# Patient Record
Sex: Male | Born: 1955 | Race: White | Hispanic: No | Marital: Married | Smoking: Never smoker
Health system: Southern US, Community
[De-identification: ages and names within clinical notes are randomized; demographics above are authoritative.]

## PROBLEM LIST (undated history)

## (undated) DIAGNOSIS — K5900 Constipation, unspecified: Secondary | ICD-10-CM

## (undated) DIAGNOSIS — I890 Lymphedema, not elsewhere classified: Secondary | ICD-10-CM

## (undated) DIAGNOSIS — J189 Pneumonia, unspecified organism: Secondary | ICD-10-CM

## (undated) DIAGNOSIS — G9341 Metabolic encephalopathy: Secondary | ICD-10-CM

## (undated) DIAGNOSIS — R569 Unspecified convulsions: Secondary | ICD-10-CM

## (undated) DIAGNOSIS — D509 Iron deficiency anemia, unspecified: Secondary | ICD-10-CM

## (undated) DIAGNOSIS — F79 Unspecified intellectual disabilities: Secondary | ICD-10-CM

## (undated) DIAGNOSIS — E875 Hyperkalemia: Secondary | ICD-10-CM

## (undated) DIAGNOSIS — K5190 Ulcerative colitis, unspecified, without complications: Secondary | ICD-10-CM

## (undated) DIAGNOSIS — N1330 Unspecified hydronephrosis: Secondary | ICD-10-CM

## (undated) DIAGNOSIS — IMO0001 Reserved for inherently not codable concepts without codable children: Secondary | ICD-10-CM

## (undated) DIAGNOSIS — B49 Unspecified mycosis: Secondary | ICD-10-CM

## (undated) DIAGNOSIS — E669 Obesity, unspecified: Secondary | ICD-10-CM

## (undated) DIAGNOSIS — N200 Calculus of kidney: Secondary | ICD-10-CM

## (undated) DIAGNOSIS — M6281 Muscle weakness (generalized): Secondary | ICD-10-CM

## (undated) HISTORY — PX: OTHER SURGICAL HISTORY: SHX169

---

## 2001-11-22 ENCOUNTER — Ambulatory Visit (HOSPITAL_COMMUNITY): Admission: RE | Admit: 2001-11-22 | Discharge: 2001-11-22 | Payer: Self-pay | Admitting: Family Medicine

## 2003-03-03 ENCOUNTER — Encounter: Admission: RE | Admit: 2003-03-03 | Discharge: 2003-05-31 | Payer: Self-pay | Admitting: Specialist

## 2010-03-18 ENCOUNTER — Inpatient Hospital Stay (HOSPITAL_COMMUNITY): Admission: EM | Admit: 2010-03-18 | Discharge: 2010-03-24 | Payer: Self-pay | Admitting: Emergency Medicine

## 2011-02-11 LAB — CBC
HCT: 33.1 % — ABNORMAL LOW (ref 39.0–52.0)
HCT: 39 % (ref 39.0–52.0)
HCT: 48.3 % (ref 39.0–52.0)
HCT: 51.3 % (ref 39.0–52.0)
Hemoglobin: 11.1 g/dL — ABNORMAL LOW (ref 13.0–17.0)
Hemoglobin: 16.1 g/dL (ref 13.0–17.0)
Hemoglobin: 17.1 g/dL — ABNORMAL HIGH (ref 13.0–17.0)
MCHC: 33.9 g/dL (ref 30.0–36.0)
MCHC: 33.3 g/dL (ref 30.0–36.0)
MCHC: 33.4 g/dL (ref 30.0–36.0)
MCHC: 33.4 g/dL (ref 30.0–36.0)
MCV: 93.3 fL (ref 78.0–100.0)
MCV: 94.2 fL (ref 78.0–100.0)
MCV: 94.8 fL (ref 78.0–100.0)
Platelets: 298 10*3/uL (ref 150–400)
Platelets: 191 10*3/uL (ref 150–400)
Platelets: 194 10*3/uL (ref 150–400)
Platelets: 242 K/uL (ref 150–400)
RBC: 5.45 MIL/uL (ref 4.22–5.81)
RDW: 15.3 % (ref 11.5–15.5)
RDW: 15.2 % (ref 11.5–15.5)
RDW: 15.2 % (ref 11.5–15.5)
RDW: 15.3 % (ref 11.5–15.5)
WBC: 8 10*3/uL (ref 4.0–10.5)
WBC: 20.4 10*3/uL — ABNORMAL HIGH (ref 4.0–10.5)
WBC: 24.2 10*3/uL — ABNORMAL HIGH (ref 4.0–10.5)

## 2011-02-11 LAB — URINALYSIS, ROUTINE W REFLEX MICROSCOPIC
Glucose, UA: 250 mg/dL — AB
Nitrite: NEGATIVE
Protein, ur: 30 mg/dL — AB
Specific Gravity, Urine: 1.023 (ref 1.005–1.030)
Urobilinogen, UA: 1 mg/dL (ref 0.0–1.0)
pH: 5 (ref 5.0–8.0)

## 2011-02-11 LAB — URINE CULTURE
Colony Count: NO GROWTH
Culture: NO GROWTH

## 2011-02-11 LAB — DIFFERENTIAL
Basophils Absolute: 0 10*3/uL (ref 0.0–0.1)
Basophils Relative: 0 % (ref 0–1)
Eosinophils Absolute: 0 10*3/uL (ref 0.0–0.7)
Eosinophils Absolute: 0 10*3/uL (ref 0.0–0.7)
Eosinophils Relative: 0 % (ref 0–5)
Eosinophils Relative: 0 % (ref 0–5)
Lymphocytes Relative: 3 % — ABNORMAL LOW (ref 12–46)
Lymphocytes Relative: 4 % — ABNORMAL LOW (ref 12–46)
Lymphs Abs: 0.7 K/uL (ref 0.7–4.0)
Lymphs Abs: 0.8 10*3/uL (ref 0.7–4.0)
Monocytes Absolute: 1.1 K/uL — ABNORMAL HIGH (ref 0.1–1.0)
Monocytes Absolute: 2.9 10*3/uL — ABNORMAL HIGH (ref 0.1–1.0)
Monocytes Relative: 14 % — ABNORMAL HIGH (ref 3–12)
Monocytes Relative: 5 % (ref 3–12)
Neutro Abs: 22.4 K/uL — ABNORMAL HIGH (ref 1.7–7.7)
Neutrophils Relative %: 92 % — ABNORMAL HIGH (ref 43–77)

## 2011-02-11 LAB — COMPREHENSIVE METABOLIC PANEL
AST: 18 U/L (ref 0–37)
Albumin: 2.2 g/dL — ABNORMAL LOW (ref 3.5–5.2)
Alkaline Phosphatase: 78 U/L (ref 39–117)
BUN: 45 mg/dL — ABNORMAL HIGH (ref 6–23)
BUN: 54 mg/dL — ABNORMAL HIGH (ref 6–23)
CO2: 20 mEq/L (ref 19–32)
CO2: 22 mEq/L (ref 19–32)
Chloride: 108 mEq/L (ref 96–112)
Chloride: 109 mEq/L (ref 96–112)
Creatinine, Ser: 1.18 mg/dL (ref 0.4–1.5)
Creatinine, Ser: 1.44 mg/dL (ref 0.4–1.5)
GFR calc non Af Amer: 52 mL/min — ABNORMAL LOW (ref >60)
GFR calc non Af Amer: >60 mL/min (ref >60)
Glucose, Bld: 153 mg/dL — ABNORMAL HIGH (ref 70–99)
Potassium: 6.6 mEq/L (ref 3.5–5.1)
Total Bilirubin: 0.4 mg/dL (ref 0.3–1.2)
Total Bilirubin: 0.5 mg/dL (ref 0.3–1.2)

## 2011-02-11 LAB — CULTURE, BLOOD (ROUTINE X 2)
Culture: NO GROWTH
Culture: NO GROWTH

## 2011-02-11 LAB — CLOSTRIDIUM DIFFICILE EIA: C difficile Toxins A+B, EIA: NEGATIVE

## 2011-02-11 LAB — BASIC METABOLIC PANEL
BUN: 2 mg/dL — ABNORMAL LOW (ref 6–23)
BUN: 3 mg/dL — ABNORMAL LOW (ref 6–23)
BUN: 1 mg/dL — ABNORMAL LOW (ref 6–23)
BUN: 29 mg/dL — ABNORMAL HIGH (ref 6–23)
CO2: 14 mEq/L — ABNORMAL LOW (ref 19–32)
CO2: 13 mEq/L — ABNORMAL LOW (ref 19–32)
CO2: 16 mEq/L — ABNORMAL LOW (ref 19–32)
CO2: 19 mEq/L (ref 19–32)
Calcium: 7.7 mg/dL — ABNORMAL LOW (ref 8.4–10.5)
Calcium: 7.4 mg/dL — ABNORMAL LOW (ref 8.4–10.5)
Calcium: 7.5 mg/dL — ABNORMAL LOW (ref 8.4–10.5)
Calcium: 7.6 mg/dL — ABNORMAL LOW (ref 8.4–10.5)
Chloride: 116 mEq/L — ABNORMAL HIGH (ref 96–112)
Chloride: 114 mEq/L — ABNORMAL HIGH (ref 96–112)
Creatinine, Ser: 0.72 mg/dL (ref 0.4–1.5)
Creatinine, Ser: 0.74 mg/dL (ref 0.4–1.5)
Creatinine, Ser: 0.78 mg/dL (ref 0.4–1.5)
GFR calc Af Amer: >60 mL/min (ref >60)
GFR calc Af Amer: >60 mL/min (ref >60)
GFR calc Af Amer: >60 mL/min (ref >60)
GFR calc non Af Amer: >60 mL/min (ref >60)
GFR calc non Af Amer: >60 mL/min (ref >60)
GFR calc non Af Amer: >60 mL/min (ref >60)
Glucose, Bld: 100 mg/dL — ABNORMAL HIGH (ref 70–99)
Glucose, Bld: 101 mg/dL — ABNORMAL HIGH (ref 70–99)
Glucose, Bld: 113 mg/dL — ABNORMAL HIGH (ref 70–99)
Glucose, Bld: 81 mg/dL (ref 70–99)
Glucose, Bld: 89 mg/dL (ref 70–99)
Potassium: 3.2 mEq/L — ABNORMAL LOW (ref 3.5–5.1)
Potassium: 4.3 mEq/L (ref 3.5–5.1)
Sodium: 143 mEq/L (ref 135–145)
Sodium: 139 mEq/L (ref 135–145)
Sodium: 142 mEq/L (ref 135–145)

## 2011-02-11 LAB — STOOL CULTURE

## 2011-02-11 LAB — URINE MICROSCOPIC-ADD ON

## 2011-02-11 LAB — POCT I-STAT, CHEM 8
Creatinine, Ser: 1.1 mg/dL (ref 0.4–1.5)
Glucose, Bld: 202 mg/dL — ABNORMAL HIGH (ref 70–99)
HCT: 54 % — ABNORMAL HIGH (ref 39.0–52.0)
Hemoglobin: 18.4 g/dL — ABNORMAL HIGH (ref 13.0–17.0)
Potassium: 5.9 mEq/L — ABNORMAL HIGH (ref 3.5–5.1)
Sodium: 138 mEq/L (ref 135–145)
TCO2: 22 mmol/L (ref 0–100)

## 2011-02-11 LAB — BLOOD GAS, VENOUS
Acid-base deficit: 7 mmol/L — ABNORMAL HIGH (ref 0.0–2.0)
Bicarbonate: 20.2 mEq/L (ref 20.0–24.0)
O2 Saturation: 52.1 %
Patient temperature: 98.6
TCO2: 17.8 mmol/L (ref 0–100)
pH, Ven: 7.257 (ref 7.250–7.300)

## 2011-02-11 LAB — CARDIAC PANEL(CRET KIN+CKTOT+MB+TROPI)
CK, MB: 0.9 ng/mL (ref 0.3–4.0)
Relative Index: INVALID (ref 0.0–2.5)
Total CK: 12 U/L (ref 7–232)
Total CK: 9 U/L (ref 7–232)
Troponin I: 0.01 ng/mL (ref 0.00–0.06)

## 2011-02-11 LAB — LACTIC ACID, PLASMA: Lactic Acid, Venous: 3 mmol/L — ABNORMAL HIGH (ref 0.5–2.2)

## 2011-02-11 LAB — PROTIME-INR
INR: 1.12 (ref 0.00–1.49)
Prothrombin Time: 14.3 s (ref 11.6–15.2)

## 2011-02-11 LAB — HEMOCCULT GUIAC POC 1CARD (OFFICE): Fecal Occult Bld: POSITIVE

## 2011-02-11 LAB — OVA AND PARASITE EXAMINATION

## 2011-02-11 LAB — APTT: aPTT: 30 seconds (ref 24–37)

## 2012-01-11 ENCOUNTER — Encounter (HOSPITAL_COMMUNITY): Payer: Self-pay | Admitting: Emergency Medicine

## 2012-01-11 ENCOUNTER — Inpatient Hospital Stay (HOSPITAL_COMMUNITY)
Admission: EM | Admit: 2012-01-11 | Discharge: 2012-01-20 | DRG: 853 | Disposition: A | Discharge status: Skilled Nursing Facility | Payer: Medicare Other | Attending: Internal Medicine | Admitting: Internal Medicine

## 2012-01-11 ENCOUNTER — Emergency Department (HOSPITAL_COMMUNITY): Payer: Medicare Other

## 2012-01-11 DIAGNOSIS — Z66 Do not resuscitate: Secondary | ICD-10-CM | POA: Diagnosis present

## 2012-01-11 DIAGNOSIS — R739 Hyperglycemia, unspecified: Secondary | ICD-10-CM | POA: Diagnosis present

## 2012-01-11 DIAGNOSIS — R0902 Hypoxemia: Secondary | ICD-10-CM | POA: Diagnosis present

## 2012-01-11 DIAGNOSIS — F72 Severe intellectual disabilities: Secondary | ICD-10-CM | POA: Diagnosis present

## 2012-01-11 DIAGNOSIS — E870 Hyperosmolality and hypernatremia: Secondary | ICD-10-CM | POA: Diagnosis present

## 2012-01-11 DIAGNOSIS — F79 Unspecified intellectual disabilities: Secondary | ICD-10-CM | POA: Diagnosis present

## 2012-01-11 DIAGNOSIS — K567 Ileus, unspecified: Secondary | ICD-10-CM | POA: Diagnosis present

## 2012-01-11 DIAGNOSIS — E876 Hypokalemia: Secondary | ICD-10-CM | POA: Diagnosis not present

## 2012-01-11 DIAGNOSIS — G40909 Epilepsy, unspecified, not intractable, without status epilepticus: Secondary | ICD-10-CM | POA: Diagnosis present

## 2012-01-11 DIAGNOSIS — R7309 Other abnormal glucose: Secondary | ICD-10-CM | POA: Diagnosis present

## 2012-01-11 DIAGNOSIS — N179 Acute kidney failure, unspecified: Secondary | ICD-10-CM | POA: Diagnosis present

## 2012-01-11 DIAGNOSIS — N200 Calculus of kidney: Secondary | ICD-10-CM | POA: Diagnosis present

## 2012-01-11 DIAGNOSIS — D72829 Elevated white blood cell count, unspecified: Secondary | ICD-10-CM | POA: Diagnosis present

## 2012-01-11 DIAGNOSIS — A0472 Enterocolitis due to Clostridium difficile, not specified as recurrent: Secondary | ICD-10-CM | POA: Diagnosis present

## 2012-01-11 DIAGNOSIS — J189 Pneumonia, unspecified organism: Secondary | ICD-10-CM

## 2012-01-11 DIAGNOSIS — A419 Sepsis, unspecified organism: Principal | ICD-10-CM | POA: Diagnosis present

## 2012-01-11 DIAGNOSIS — N1330 Unspecified hydronephrosis: Secondary | ICD-10-CM | POA: Diagnosis present

## 2012-01-11 HISTORY — DX: Unspecified intellectual disabilities: F79

## 2012-01-11 HISTORY — DX: Pneumonia, unspecified organism: J18.9

## 2012-01-11 HISTORY — DX: Reserved for inherently not codable concepts without codable children: IMO0001

## 2012-01-11 HISTORY — DX: Hyperkalemia: E87.5

## 2012-01-11 HISTORY — DX: Muscle weakness (generalized): M62.81

## 2012-01-11 HISTORY — DX: Unspecified convulsions: R56.9

## 2012-01-11 HISTORY — DX: Calculus of kidney: N20.0

## 2012-01-11 HISTORY — DX: Lymphedema, not elsewhere classified: I89.0

## 2012-01-11 HISTORY — DX: Unspecified hydronephrosis: N13.30

## 2012-01-11 HISTORY — DX: Metabolic encephalopathy: G93.41

## 2012-01-11 HISTORY — DX: Ulcerative colitis, unspecified, without complications: K51.90

## 2012-01-11 HISTORY — DX: Constipation, unspecified: K59.00

## 2012-01-11 HISTORY — DX: Obesity, unspecified: E66.9

## 2012-01-11 LAB — COMPREHENSIVE METABOLIC PANEL
Albumin: 2.6 g/dL — ABNORMAL LOW (ref 3.5–5.2)
Alkaline Phosphatase: 154 U/L — ABNORMAL HIGH (ref 39–117)
BUN: 46 mg/dL — ABNORMAL HIGH (ref 6–23)
Calcium: 9.1 mg/dL (ref 8.4–10.5)
Potassium: 3.7 mEq/L (ref 3.5–5.1)
Total Protein: 7.6 g/dL (ref 6.0–8.3)

## 2012-01-11 MED ORDER — VANCOMYCIN HCL IN DEXTROSE 1-5 GM/200ML-% IV SOLN
1000.0000 mg | Freq: Once | INTRAVENOUS | Status: AC
  Administered 2012-01-12: 1000 mg via INTRAVENOUS
  Filled 2012-01-11: qty 200

## 2012-01-11 MED ORDER — SODIUM CHLORIDE 0.9 % IV BOLUS (SEPSIS)
250.0000 mL | Freq: Once | INTRAVENOUS | Status: AC
  Administered 2012-01-11: 250 mL via INTRAVENOUS

## 2012-01-11 MED ORDER — PIPERACILLIN-TAZOBACTAM 3.375 G IVPB
3.3750 g | Freq: Once | INTRAVENOUS | Status: AC
  Administered 2012-01-12: 3.375 g via INTRAVENOUS
  Filled 2012-01-11: qty 50

## 2012-01-11 NOTE — ED Notes (Signed)
AS per EMS, pt has had a fever for the last 24 hours. Pt is non verbal. Pt is a resident at [facility]

## 2012-01-11 NOTE — ED Provider Notes (Signed)
History     CSN: [id number]  Arrival date & time 01/11/12  2028   First MD Initiated Contact with Patient 01/11/12 2049      Chief Complaint  Patient presents with  . Fever    (Consider location/radiation/quality/duration/timing/severity/associated sxs/prior treatment) HPI Comments: This is a 56 year old gentleman, who is in clips.  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  That does resolve with antipyretics today.  Chest x-ray and acute abdomen.  Study was done along with white count, which reveals that he has an elevated white count to 29,300 and infiltrate in the right lower lung base.  There is also a documented, ileus or possibly large bowel obstruction with multiple air-fluid levels.  An acute abdominal series.  He was sent to the emergency room for further evaluation.  Father states that last year had a similar presentation of ileus that resolved after one week of hospitalization, requiring no surgery.  Father does breast grade concerned that his son would not tolerate surgery  Patient is a 56 y.o. male presenting with fever. The history is provided by a parent. The history is limited by a language barrier.  Fever Primary symptoms of the febrile illness include fever and abdominal pain. Primary symptoms do not include cough or shortness of breath. The current episode started yesterday. This is a new problem. The problem has not changed since onset.   Past Medical History  Diagnosis Date  . Ulcerative colitis, unspecified   . Metabolic encephalopathy   . Unspecified intellectual disabilities   . Obesity, unspecified   . Hyperpotassemia   . Other convulsions   . Other lymphedema   . Unspecified constipation   . Other physical therapy   . Muscle weakness (generalized)     No past surgical history on file.  No family history on file.  History  Substance Use Topics  . Smoking status: Not on file  . Smokeless tobacco: Not on file    . Alcohol Use: No      Review of Systems  Constitutional: Positive for fever.  Respiratory: Negative for cough and shortness of breath.   Gastrointestinal: Positive for abdominal pain and abdominal distention.  Skin: Positive for pallor.  Neurological: Positive for seizures.    Allergies  Review of patient's allergies indicates no known allergies.  Home Medications   Current Outpatient Rx  Name Route Sig Dispense Refill  . ALBUTEROL SULFATE (2.5 MG/3ML) 0.083% IN NEBU Nebulization Take 2.5 mg by nebulization every 6 (six) hours as needed.    Marland Kitchen DEXTROMETHORPHAN POLISTIREX ER 30 MG/5ML PO LQCR Oral Take 60 mg by mouth as needed.    . GUAIFENESIN ER 600 MG PO TB12 Oral Take 1,200 mg by mouth 2 (two) times daily.    Marland Kitchen LACTULOSE 10 GM/15ML PO SOLN Oral Take 30 g by mouth daily.    Marland Kitchen OVER THE COUNTER MEDICATION Oral Take 1 tablet by mouth See admin instructions. Swiss Kriss laxative 1 tablet with every meal.    . PHENOBARBITAL 100 MG PO TABS Oral Take 100 mg by mouth daily. Takes along with a 60mg  tablet for a total of 160mg     . PHENOBARBITAL 60 MG PO TABS Oral Take 60 mg by mouth daily. Takes along with a 100mg  tablet for a total dosing of 160mg     . PHENYTOIN 125 MG/5ML PO SUSP Oral Take 200 mg by mouth 2 (two) times daily.    Marland Kitchen POTASSIUM CHLORIDE ER 10 MEQ  PO TBCR Oral Take 20 mEq by mouth daily.    Marland Kitchen SIMETHICONE 40 MG/0.6ML PO SUSP Oral Take 120 mg by mouth every 12 (twelve) hours.      BP 109/71  Pulse 130  Temp(Src) 98.1 F (36.7 C) (Oral)  Resp 31  SpO2 90%  Physical Exam  HENT:  Head: Normocephalic.  Eyes: Pupils are equal, round, and reactive to light.  Neck: Normal range of motion.  Cardiovascular: Tachycardia present.   Pulmonary/Chest: Breath sounds normal. No respiratory distress.  Abdominal: He exhibits distension. There is tenderness.  Musculoskeletal: He exhibits no tenderness.  Neurological: He is alert.  Skin: Skin is warm. No rash noted. He is not  diaphoretic. There is pallor.    ED Course  Procedures (including critical care time)   Labs Reviewed  COMPREHENSIVE METABOLIC PANEL   No results found.   No diagnosis found.    MDM   a repeat chest x-ray obtained.  CT of abdomen with IV contrast only, CMET.  Reviewed.  CBC, and urine from outside lab        Arman Filter, NP 01/14/12 1538

## 2012-01-11 NOTE — ED Provider Notes (Signed)
Medical screening examination/treatment/procedure(s) were conducted as a shared visit with non-physician practitioner(s) and myself.  I personally evaluated the patient during the encounter 56 year old, male, with mental retardation, who does not speak and lives in collapse.  Nursing home, presents to the emergency department for evaluation of a fever.  He was seen by his primary care doctor, who had ordered laboratory testing, and x-ray.  He has a leukocytosis of greater than 28,000.  His chest x-ray showed possibility of pneumonia and an abdominal x-ray showed possible bowel obstruction.  We will perform laboratory testing here and repeat x-rays, so, we can see.  The images and then consult the appropriate physician, for further evaluation and care  Nicholes Stairs, MD 01/11/12 2317

## 2012-01-11 NOTE — ED Notes (Signed)
IV team and lab at bedside to assist with IV placement and labs

## 2012-01-11 NOTE — ED Notes (Signed)
Patient transported to X-ray 

## 2012-01-12 ENCOUNTER — Encounter (HOSPITAL_COMMUNITY): Payer: Self-pay | Admitting: Family Medicine

## 2012-01-12 DIAGNOSIS — A0472 Enterocolitis due to Clostridium difficile, not specified as recurrent: Secondary | ICD-10-CM | POA: Diagnosis present

## 2012-01-12 DIAGNOSIS — N200 Calculus of kidney: Secondary | ICD-10-CM

## 2012-01-12 DIAGNOSIS — N179 Acute kidney failure, unspecified: Secondary | ICD-10-CM | POA: Diagnosis present

## 2012-01-12 DIAGNOSIS — N1330 Unspecified hydronephrosis: Secondary | ICD-10-CM

## 2012-01-12 DIAGNOSIS — F79 Unspecified intellectual disabilities: Secondary | ICD-10-CM | POA: Diagnosis present

## 2012-01-12 DIAGNOSIS — D72829 Elevated white blood cell count, unspecified: Secondary | ICD-10-CM | POA: Diagnosis present

## 2012-01-12 DIAGNOSIS — R739 Hyperglycemia, unspecified: Secondary | ICD-10-CM | POA: Diagnosis present

## 2012-01-12 DIAGNOSIS — J189 Pneumonia, unspecified organism: Secondary | ICD-10-CM | POA: Diagnosis present

## 2012-01-12 DIAGNOSIS — K567 Ileus, unspecified: Secondary | ICD-10-CM | POA: Diagnosis present

## 2012-01-12 DIAGNOSIS — G40909 Epilepsy, unspecified, not intractable, without status epilepticus: Secondary | ICD-10-CM | POA: Diagnosis present

## 2012-01-12 HISTORY — DX: Pneumonia, unspecified organism: J18.9

## 2012-01-12 HISTORY — DX: Calculus of kidney: N20.0

## 2012-01-12 HISTORY — DX: Unspecified hydronephrosis: N13.30

## 2012-01-12 LAB — BASIC METABOLIC PANEL
BUN: 46 mg/dL — ABNORMAL HIGH (ref 6–23)
Chloride: 104 mEq/L (ref 96–112)
GFR calc Af Amer: 53 mL/min — ABNORMAL LOW (ref >90)
GFR calc non Af Amer: 46 mL/min — ABNORMAL LOW (ref >90)
Glucose, Bld: 225 mg/dL — ABNORMAL HIGH (ref 70–99)
Potassium: 3.7 mEq/L (ref 3.5–5.1)
Sodium: 141 mEq/L (ref 135–145)

## 2012-01-12 LAB — URINE MICROSCOPIC-ADD ON

## 2012-01-12 LAB — CBC
HCT: 42.5 % (ref 39.0–52.0)
Hemoglobin: 13.8 g/dL (ref 13.0–17.0)
MCH: 30.9 pg (ref 26.0–34.0)
MCHC: 32.5 g/dL (ref 30.0–36.0)
RBC: 4.47 MIL/uL (ref 4.22–5.81)

## 2012-01-12 LAB — URINALYSIS, ROUTINE W REFLEX MICROSCOPIC
Bilirubin Urine: NEGATIVE
Ketones, ur: NEGATIVE mg/dL
Nitrite: NEGATIVE
Urobilinogen, UA: 0.2 mg/dL (ref 0.0–1.0)

## 2012-01-12 LAB — PHENYTOIN LEVEL, TOTAL: Phenytoin Lvl: 16.9 ug/mL (ref 10.0–20.0)

## 2012-01-12 LAB — PHENOBARBITAL LEVEL: Phenobarbital: 25.7 ug/mL (ref 15.0–40.0)

## 2012-01-12 MED ORDER — MUPIROCIN 2 % EX OINT
1.0000 "application " | TOPICAL_OINTMENT | Freq: Two times a day (BID) | CUTANEOUS | Status: AC
  Administered 2012-01-12 – 2012-01-16 (×10): 1 via NASAL
  Filled 2012-01-12: qty 22

## 2012-01-12 MED ORDER — SODIUM CHLORIDE 0.9 % IV SOLN
INTRAVENOUS | Status: DC
  Administered 2012-01-12 – 2012-01-13 (×4): via INTRAVENOUS

## 2012-01-12 MED ORDER — PHENOBARBITAL 20 MG/5ML PO ELIX
160.0000 mg | ORAL_SOLUTION | Freq: Every day | ORAL | Status: DC
  Administered 2012-01-14 – 2012-01-19 (×6): 160 mg via ORAL
  Filled 2012-01-12 (×3): qty 40
  Filled 2012-01-12: qty 35
  Filled 2012-01-12: qty 40
  Filled 2012-01-12: qty 5
  Filled 2012-01-12: qty 45

## 2012-01-12 MED ORDER — LEVOFLOXACIN IN D5W 750 MG/150ML IV SOLN
750.0000 mg | Freq: Once | INTRAVENOUS | Status: AC
  Administered 2012-01-12: 750 mg via INTRAVENOUS
  Filled 2012-01-12: qty 150

## 2012-01-12 MED ORDER — IPRATROPIUM BROMIDE 0.02 % IN SOLN: 0.5000 mg | RESPIRATORY_TRACT | Status: DC | PRN

## 2012-01-12 MED ORDER — SODIUM CHLORIDE 0.9 % IV SOLN
750.0000 mg | Freq: Two times a day (BID) | INTRAVENOUS | Status: DC
  Administered 2012-01-12 – 2012-01-14 (×5): 750 mg via INTRAVENOUS
  Filled 2012-01-12 (×6): qty 750

## 2012-01-12 MED ORDER — LEVOFLOXACIN IN D5W 750 MG/150ML IV SOLN: 750.0000 mg | INTRAVENOUS | Status: DC

## 2012-01-12 MED ORDER — PHENOBARBITAL 97.2 MG PO TABS
97.2000 mg | ORAL_TABLET | Freq: Every day | ORAL | Status: DC
  Administered 2012-01-12: 97.2 mg via ORAL
  Filled 2012-01-12: qty 1

## 2012-01-12 MED ORDER — ENOXAPARIN SODIUM 30 MG/0.3ML ~~LOC~~ SOLN
30.0000 mg | SUBCUTANEOUS | Status: DC
  Administered 2012-01-12: 30 mg via SUBCUTANEOUS
  Filled 2012-01-12 (×2): qty 0.3

## 2012-01-12 MED ORDER — CHLORHEXIDINE GLUCONATE CLOTH 2 % EX PADS
6.0000 | MEDICATED_PAD | Freq: Every day | CUTANEOUS | Status: AC
  Administered 2012-01-12 – 2012-01-16 (×5): 6 via TOPICAL

## 2012-01-12 MED ORDER — CLONIDINE HCL 0.1 MG/24HR TD PTWK
0.1000 mg | MEDICATED_PATCH | TRANSDERMAL | Status: DC
  Administered 2012-01-12 – 2012-01-19 (×2): 0.1 mg via TRANSDERMAL
  Filled 2012-01-12 (×2): qty 1

## 2012-01-12 MED ORDER — PIPERACILLIN-TAZOBACTAM 3.375 G IVPB
3.3750 g | Freq: Three times a day (TID) | INTRAVENOUS | Status: DC
  Administered 2012-01-12 – 2012-01-18 (×19): 3.375 g via INTRAVENOUS
  Filled 2012-01-12 (×22): qty 50

## 2012-01-12 MED ORDER — PHENYTOIN 125 MG/5ML PO SUSP
200.0000 mg | Freq: Two times a day (BID) | ORAL | Status: DC
  Administered 2012-01-12 – 2012-01-20 (×17): 200 mg via ORAL
  Filled 2012-01-12 (×19): qty 8

## 2012-01-12 MED ORDER — PHENOBARBITAL 64.8 MG PO TABS: 64.8000 mg | ORAL_TABLET | Freq: Every day | ORAL | Status: DC

## 2012-01-12 MED ORDER — IOHEXOL 300 MG/ML  SOLN
80.0000 mL | Freq: Once | INTRAMUSCULAR | Status: AC | PRN
  Administered 2012-01-12: 80 mL via INTRAVENOUS

## 2012-01-12 MED ORDER — LACTULOSE 10 GM/15ML PO SOLN
30.0000 g | Freq: Every day | ORAL | Status: DC
  Administered 2012-01-14 – 2012-01-20 (×4): 30 g via ORAL
  Filled 2012-01-12 (×9): qty 45

## 2012-01-12 MED ORDER — PHENOBARBITAL 20 MG/5ML PO ELIX
60.0000 mg | ORAL_SOLUTION | Freq: Once | ORAL | Status: AC
  Administered 2012-01-12: 60 mg via ORAL
  Filled 2012-01-12: qty 15

## 2012-01-12 MED ORDER — ALBUTEROL SULFATE (5 MG/ML) 0.5% IN NEBU
2.5000 mg | INHALATION_SOLUTION | RESPIRATORY_TRACT | Status: DC | PRN
  Filled 2012-01-12: qty 0.5

## 2012-01-12 NOTE — Consult Note (Signed)
Reason for Consult: Renal stones and sepsis Referring Physician: Dr. Eliott Nine Humphrey is an 56 y.o. male.  HPI: Charles Humphrey is a 56 yo mentally retarded non-verbal white male who was admitted with sepsis.  He was found to have a pneumonia but he also had a CT that showed bilateral renal stones.  There is a 6 mm stone in the right renal pelvis without obstruction.  There is a 1.6cm stone at the left UPJ with an additional smaller stone in the upper pole.  There is inflammation of the collecting system but there is not significant dilation of the system.  His UA has 11-20 WBC's and many bacteria and his WBC count is 27,000.   I have spoken to his father and there is no prior history of stones or UTI's.    Past Medical History  Diagnosis Date  . Ulcerative colitis, unspecified   . Metabolic encephalopathy   . Unspecified intellectual disabilities   . Obesity, unspecified   . Hyperpotassemia   . Other convulsions   . Other lymphedema   . Unspecified constipation   . Other physical therapy   . Muscle weakness (generalized)   . Nephrolithiasis 01/12/2012  . Hydronephrosis 01/12/2012  . Healthcare-associated pneumonia 01/12/2012    No past surgical history on file.  No family history on file.  Social History:  reports that he has never smoked. He does not have any smokeless tobacco history on file. He reports that he does not drink alcohol. His drug history not on file.  He lives in a nursing facility and is non-verbal.    Allergies: No Known Allergies  Medications: I have reviewed the patient's current medications.  Results for orders placed during the hospital encounter of 01/11/12 (from the past 48 hour(s))  COMPREHENSIVE METABOLIC PANEL     Status: Abnormal   Collection Time   01/11/12 11:19 PM      Component Value Range Comment   Sodium 142  135 - 145 (mEq/L)    Potassium 3.7  3.5 - 5.1 (mEq/L)    Chloride 104  96 - 112 (mEq/L)    CO2 25  19 - 32 (mEq/L)    Glucose, Bld  208 (*) 70 - 99 (mg/dL)    BUN 46 (*) 6 - 23 (mg/dL)    Creatinine, Ser 4.54 (*) 0.50 - 1.35 (mg/dL)    Calcium 9.1  8.4 - 10.5 (mg/dL)    Total Protein 7.6  6.0 - 8.3 (g/dL)    Albumin 2.6 (*) 3.5 - 5.2 (g/dL)    AST 11  0 - 37 (U/L)    ALT 10  0 - 53 (U/L)    Alkaline Phosphatase 154 (*) 39 - 117 (U/L)    Total Bilirubin 0.5  0.3 - 1.2 (mg/dL)    GFR calc non Af Amer 42 (*) >90 (mL/min)    GFR calc Af Amer 48 (*) >90 (mL/min)   PHENYTOIN LEVEL, TOTAL     Status: Normal   Collection Time   01/12/12  2:15 AM      Component Value Range Comment   Phenytoin Lvl 16.9  10.0 - 20.0 (ug/mL)   PHENOBARBITAL LEVEL     Status: Normal   Collection Time   01/12/12  2:15 AM      Component Value Range Comment   Phenobarbital 25.7  15.0 - 40.0 (ug/mL)   BASIC METABOLIC PANEL     Status: Abnormal   Collection Time   01/12/12  2:15 AM  Component Value Range Comment   Sodium 141  135 - 145 (mEq/L)    Potassium 3.7  3.5 - 5.1 (mEq/L)    Chloride 104  96 - 112 (mEq/L)    CO2 25  19 - 32 (mEq/L)    Glucose, Bld 225 (*) 70 - 99 (mg/dL)    BUN 46 (*) 6 - 23 (mg/dL)    Creatinine, Ser 6.04 (*) 0.50 - 1.35 (mg/dL)    Calcium 8.7  8.4 - 10.5 (mg/dL)    GFR calc non Af Amer 46 (*) >90 (mL/min)    GFR calc Af Amer 53 (*) >90 (mL/min)   CBC     Status: Abnormal   Collection Time   01/12/12  2:15 AM      Component Value Range Comment   WBC 27.5 (*) 4.0 - 10.5 (K/uL) REPEATED TO VERIFY   RBC 4.47  4.22 - 5.81 (MIL/uL)    Hemoglobin 13.8  13.0 - 17.0 (g/dL)    HCT 54.0  98.1 - 19.1 (%)    MCV 95.1  78.0 - 100.0 (fL)    MCH 30.9  26.0 - 34.0 (pg)    MCHC 32.5  30.0 - 36.0 (g/dL)    RDW 47.8  29.5 - 62.1 (%)    Platelets 190  150 - 400 (K/uL)   MRSA PCR SCREENING     Status: Abnormal   Collection Time   01/12/12  4:03 AM      Component Value Range Comment   MRSA by PCR POSITIVE (*) NEGATIVE    URINALYSIS, ROUTINE W REFLEX MICROSCOPIC     Status: Abnormal   Collection Time   01/12/12  1:34 PM       Component Value Range Comment   Color, Urine YELLOW  YELLOW     APPearance TURBID (*) CLEAR     Specific Gravity, Urine 1.039 (*) 1.005 - 1.030     pH 5.5  5.0 - 8.0     Glucose, UA NEGATIVE  NEGATIVE (mg/dL)    Hgb urine dipstick MODERATE (*) NEGATIVE     Bilirubin Urine NEGATIVE  NEGATIVE     Ketones, ur NEGATIVE  NEGATIVE (mg/dL)    Protein, ur 308 (*) NEGATIVE (mg/dL)    Urobilinogen, UA 0.2  0.0 - 1.0 (mg/dL)    Nitrite NEGATIVE  NEGATIVE     Leukocytes, UA MODERATE (*) NEGATIVE    URINE MICROSCOPIC-ADD ON     Status: Abnormal   Collection Time   01/12/12  1:34 PM      Component Value Range Comment   Squamous Epithelial / LPF FEW (*) RARE     WBC, UA 11-20  <3 (WBC/hpf)    RBC / HPF 3-6  <3 (RBC/hpf)    Bacteria, UA MANY (*) RARE     Casts GRANULAR CAST (*) NEGATIVE     Crystals URIC ACID CRYSTALS (*) NEGATIVE     Urine-Other MUCOUS PRESENT   AMORPHOUS URATES/PHOSPHATES    Ct Abdomen Pelvis W Contrast  01/12/2012  *RADIOLOGY REPORT*  Clinical Data: Fever; ileus or distal obstruction suspected on abdominal radiograph.  Assess for ileus or obstruction.  CT ABDOMEN AND PELVIS WITH CONTRAST  Technique:  Multidetector CT imaging of the abdomen and pelvis was performed following the standard protocol during bolus administration of intravenous contrast.  Contrast: 80mL OMNIPAQUE IOHEXOL 300 MG/ML IV SOLN  Comparison: Abdominal radiograph performed 01/11/2012, and CT of the abdomen and pelvis performed 03/18/2010  Findings: There is partial consolidation of  the right lower lobe; this raises concern for pneumonia, though significant atelectasis could have a similar appearance.  Scattered calcifications at the right lung base likely reflect calcified granulomata.  The liver and spleen are unremarkable in appearance.  The gallbladder is within normal limits.  The pancreas and adrenal glands are unremarkable.  There is a large 1.6 x 1.0 cm stone lodged at the left renal pelvis, with a 0.8 cm  stone noted within a left renal calyx, and scattered smaller left renal stones also seen.  There is suggestion of minimal hydronephrosis, raising concern for some degree of obstruction.  Soft tissue inflammation is noted about the largest stone; mild infection cannot be excluded.  The appearance raises concern for impending development of staghorn calculus, new from the prior study.  A smaller 0.6 cm stone is noted within the right renal pelvis, without significant soft tissue inflammation.  There is also a nonobstructing 6 mm stone at the upper pole of the right kidney. Mild nonspecific perinephric stranding is noted bilaterally.  Delayed imaging demonstrates no evidence of contrast excretion from either kidney; this raises concern for decreased renal function.  No free fluid is identified.  The small bowel is largely decompressed and unremarkable in appearance.  The stomach is within normal limits.  No acute vascular abnormalities are seen.  The appendix is normal in caliber, without evidence for appendicitis.  The ascending and descending colon remain normal in caliber, and there is only mild distension of the transverse colon; there is slightly more distension of the sigmoid colon; this appears to reflect chronic underlying sigmoid inflammation given prior evidence of colitis in 2011.  There is corresponding progressive colonic wall thickening along the distal sigmoid colon and rectum, likely reflecting the prior inflammatory process.  No distal obstruction is identified. Findings are not particularly suggestive of ileus.  A 1.3 cm soft tissue cyst along the mid back pain most likely benign in nature, given its appearance.  The bladder is mildly distended and grossly unremarkable in appearance.  The prostate is normal in size, with scattered calcification.  No inguinal lymphadenopathy is seen.  No acute osseous abnormalities are identified.  IMPRESSION:  1.  No evidence of distal obstruction; colonic distension  involves predominantly the sigmoid colon, and is thought to reflect underlying chronic sequelae of prior colitis.  No definite evidence of ileus.  Mild colonic wall thickening along the distal sigmoid colon and rectum is significantly improved from 2011, likely reflects chronic underlying inflammation. 2.  Large 1.6 x 1.0 cm stone lodged at the left renal pelvis, with additional smaller left renal stones.  Soft tissue inflammation about this largest stone, with suggestion of minimal hydronephrosis, raising concern for some degree of obstruction. Infection cannot be excluded, given soft tissue inflammation; the appearance raises concern for impending development of staghorn calculus, new from the prior study. 3.  Smaller 0.6 cm stone at the right renal pelvis; additional nonobstructing right renal stones seen. 4.  No evidence of contrast excretion from either kidney on delayed images, raising concern for bilaterally decreased renal function. 5.  Partial consolidation of the right lower lung lobe, concerning for pneumonia, though significant atelectasis could have a similar appearance.  Original Report Authenticated By: Tonia Ghent, M.D.   Dg Abd Acute W/chest  01/11/2012  *RADIOLOGY REPORT*  Clinical Data: Fever.  Abdominal distention.  ACUTE ABDOMEN SERIES (ABDOMEN 2 VIEW & CHEST 1 VIEW)  Comparison: Chest dated 03/23/2010 and abdomen and pelvis CT dated 03/18/2010.  Findings: Interval dense  opacity in the right mid and upper lung zones.  Clear left lung.  Grossly stable enlarged cardiac silhouette.  Prominent gas-filled redundant colon throughout the abdomen.  Small amount of fluid in the bowel loops in the decubitus position.  No free peritoneal air.  Diffuse osteopenia.  IMPRESSION:  1.  Dense right mid and upper lung zone pneumonia.  Follow-up chest radiographs are recommended until clearing to exclude an underlying mass. 2.  Stable cardiomegaly. 3.  Diffuse colonic ileus or distal obstruction.  Original  Report Authenticated By: Darrol Angel, M.D.    Review of Systems  Unable to perform ROS: patient nonverbal   Blood pressure 125/81, pulse 130, temperature 99 F (37.2 C), temperature source Axillary, resp. rate 22, SpO2 90.00%. Physical Exam  Constitutional:       He has severe MR and is non-verbal.  He has contracted lower extremities.  Cardiovascular: Regular rhythm.  Tachycardia present.   Respiratory: Not tachypneic and not bradypneic. No respiratory distress.  GI: Soft. Normal appearance. There is no tenderness.    Assessment/Plan: He has sepsis with bilateral renal stones with a large left UPJ stone with inflammation and probable obstruction despite the lack of hydro.   He also has an apparent pneumonia.  With the 1.6cm stone in the left kidney along with a second stone, he will be best served by percutaneous nephrostomy tube placement in the near future followed by a left percutaneous nephrolithotomy on an elective basis.    He will require close f/u and probable treatment of the right renal stones as well, but that might be done with ureteroscopy at a later date.  I have spoken with Dr. Darnelle Catalan and Dr. Gwenith Daily about this patient.  Goldia Ligman J 01/12/2012, 4:53 PM

## 2012-01-12 NOTE — Progress Notes (Signed)
PATIENT DETAILS Name: Charles Humphrey Age: 56 y.o. Sex: male Date of Birth: 1956/02/07 Admit Date: 01/11/2012 PCP:No primary provider on file. Emergency contact: Charles Humphrey (father) [phone] (h), [phone] (c)   Medical Consults:  Urology  Other Consults:  Pharmacy for Antibiotic dosing  Brief Narrative: Mr. Charles Humphrey. is a 56 year-old gentleman resident of [facility] with a PMH of mental retardation.  At the nursing home he had a fever up to 101.9 and was hypoxic 89% on RA. Workup done there revealed a pneumonia and a white blood count of 29. He was sent to the ER and was admitted for treatment of HAP.  He was also found to have significant nephrolithiasis with an obstructing stone left renal pelvis and hydronephrosis.  Interval History: Stable over night.  ROS: The patient is non-verbal.  His father reports that he looks better and is not as restless.  Bowels moved last night and again this morning.   Objective: Vital signs in last 24 hours: Temp:  [98.1 F (36.7 C)-99 F (37.2 C)] 99 F (37.2 C) (02/18 0349) Pulse Rate:  [126-131] 126  (02/18 0349) Resp:  [22-32] 22  (02/18 0349) BP: (109-175)/(70-81) 175/81 mmHg (02/18 0349) SpO2:  [90 %-94 %] 91 % (02/18 0349) Weight change:  Last BM Date: 01/12/12  Intake/Output from previous day:  Intake/Output Summary (Last 24 hours) at 01/12/12 1056 Last data filed at 01/12/12 0700  Gross per 24 hour  Intake  640.8 ml  Output    400 ml  Net  240.8 ml     Physical Exam:  Gen:  NAD Cardiovascular:  Tachycardic, No M/R/G Respiratory: Lungs CTAB Gastrointestinal: Abdomen soft, NT/ND with normal active bowel sounds. Extremities: No C/E/C, LE contractures   Lab Results: Basic Metabolic Panel:  Lab 01/12/12 0865 01/11/12 2319  NA 141 142  K 3.7 3.7  CL 104 104  CO2 25 25  GLUCOSE 225* 208*  BUN 46* 46*  CREATININE 1.66* 1.79*  CALCIUM 8.7 9.1  MG -- --  PHOS -- --   GFR CrCl  is unknown because there is no height on file for the current visit. Liver Function Tests:  Lab 01/11/12 2319  AST 11  ALT 10  ALKPHOS 154*  BILITOT 0.5  PROT 7.6  ALBUMIN 2.6*    CBC:  Lab 01/12/12 0215  WBC 27.5*  NEUTROABS --  HGB 13.8  HCT 42.5  MCV 95.1  PLT 190   Microbiology Recent Results (from the past 240 hour(s))  MRSA PCR SCREENING     Status: Abnormal   Collection Time   01/12/12  4:03 AM      Component Value Range Status Comment   MRSA by PCR POSITIVE (*) NEGATIVE  Final       Studies/Results:  Ct Abdomen Pelvis W Contrast 01/12/2012   IMPRESSION:  1.  No evidence of distal obstruction; colonic distension involves predominantly the sigmoid colon, and is thought to reflect underlying chronic sequelae of prior colitis.  No definite evidence of ileus.  Mild colonic wall thickening along the distal sigmoid colon and rectum is significantly improved from 2011, likely reflects chronic underlying inflammation. 2.  Large 1.6 x 1.0 cm stone lodged at the left renal pelvis, with additional smaller left renal stones.  Soft tissue inflammation about this largest stone, with suggestion of minimal hydronephrosis, raising concern for some degree of obstruction. Infection cannot be excluded, given soft tissue inflammation; the appearance raises concern for impending development of staghorn calculus,  new from the prior study. 3.  Smaller 0.6 cm stone at the right renal pelvis; additional nonobstructing right renal stones seen. 4.  No evidence of contrast excretion from either kidney on delayed images, raising concern for bilaterally decreased renal function. 5.  Partial consolidation of the right lower lung lobe, concerning for pneumonia, though significant atelectasis could have a similar appearance.  Original Report Authenticated By: Tonia Ghent, M.D.    Dg Abd Acute W/chest 01/11/2012 IMPRESSION:  1.  Dense right mid and upper lung zone pneumonia.  Follow-up chest radiographs are  recommended until clearing to exclude an underlying mass. 2.  Stable cardiomegaly. 3.  Diffuse colonic ileus or distal obstruction.  Original Report Authenticated By: Darrol Angel, M.D.    Medications: Scheduled Meds:    . Chlorhexidine Gluconate Cloth  6 each Topical Q0600  . cloNIDine  0.1 mg Transdermal Weekly  . enoxaparin  30 mg Subcutaneous Q24H  . lactulose  30 g Oral Daily  . levofloxacin (LEVAQUIN) IV  750 mg Intravenous Once  . mupirocin ointment  1 application Nasal BID  . PHENobarbital  64.8 mg Oral Daily  . PHENobarbital  97.2 mg Oral Daily  . phenytoin  200 mg Oral BID  . piperacillin-tazobactam (ZOSYN)  IV  3.375 g Intravenous Once  . piperacillin-tazobactam (ZOSYN)  IV  3.375 g Intravenous Q8H  . sodium chloride  250 mL Intravenous Once  . vancomycin  750 mg Intravenous Q12H  . vancomycin  1,000 mg Intravenous Once  . DISCONTD: levofloxacin (LEVAQUIN) IV  750 mg Intravenous Q24H   Continuous Infusions:    . sodium chloride 75 mL/hr at 01/12/12 0559   PRN Meds:.albuterol, iohexol, ipratropium Antibiotics: Anti-infectives     Start     Dose/Rate Route Frequency Ordered Stop   01/13/12 0500   Levofloxacin (LEVAQUIN) IVPB 750 mg  Status:  Discontinued        750 mg 100 mL/hr over 90 Minutes Intravenous Every 24 hours 01/12/12 0450 01/12/12 0600   01/12/12 1200   vancomycin (VANCOCIN) 750 mg in sodium chloride 0.9 % 150 mL IVPB        750 mg 150 mL/hr over 60 Minutes Intravenous Every 12 hours 01/12/12 0450     01/12/12 0800   piperacillin-tazobactam (ZOSYN) IVPB 3.375 g        3.375 g 12.5 mL/hr over 240 Minutes Intravenous Every 8 hours 01/12/12 0450     01/12/12 0400   Levofloxacin (LEVAQUIN) IVPB 750 mg        750 mg 100 mL/hr over 90 Minutes Intravenous  Once 01/12/12 0334 01/12/12 0552   01/12/12 0015   vancomycin (VANCOCIN) IVPB 1000 mg/200 mL premix        1,000 mg 200 mL/hr over 60 Minutes Intravenous  Once 01/11/12 2350 01/12/12 0107    01/12/12 0015   piperacillin-tazobactam (ZOSYN) IVPB 3.375 g        3.375 g 12.5 mL/hr over 240 Minutes Intravenous  Once 01/11/12 2350 01/12/12 0407           Assessment/Plan:  Principal Problem:  *Healthcare-associated pneumonia / Leukocytosis The patient was admitted and blood, urine and sputum cultures were ordered, and are all pending.  He is on day #2 empiric Levaquin, Zosyn and Vancomycin.  Oxygen saturations are stable.  Monitor leukocytosis.  Speech therapy evaluation has been requested, due to concerns for aspiration. Active Problems:  Epilepsy The patient is being maintained on his home doses of dilantin and phenobarbital.  No  seizure events noted in the hospital.  Mental retardation The patient understands "yes or no" but no other communication. He does not talk and stopped ambulating ~ 6 yrs ago. He does not sit up on his own. He usually has a good appetite and eats regular food.  ARF (acute renal failure) / Nephrolithiasis /Hydronephrosis Will request urological consultation.  Chronic colitis Known history of ulcerative colitis.  No evidence of obstruction or ileus.  With marked elevation of WBC, will check stool for clostridium difficile by PCR.  Hyperglycemia Will check hemoglobin A1c.    LOS: 1 day   Hillery Aldo, MD Pager [phone]  01/12/2012, 10:56 AM

## 2012-01-12 NOTE — Progress Notes (Signed)
UR CHART COMPLETED; B Chyla Schlender RN, BSN, MHA 

## 2012-01-12 NOTE — H&P (Addendum)
PCP:   No primary provider on file.   Chief Complaint:  Fevers and hypoxia  HPI: This is a 56 year old gentleman resident of [facility]. He has mental retardation and per nursing is currently at his baseline. Patient is arousable but does not answer questions. At the nursing home he had a fever up to 101.9 and was hypoxic 89% on RA. Workup done there revealed a pneumonia and a white blood count of 29. He was sent to the ER. In here in the ER, chest x-ray confirms pneumonia.   Point of contact POA: [employer] [phone]. Per Mr Vilinda Boehringer patient understands yes or no but no other communication. He does not talk and stopped ambulating ~ 6 yrs ago. He does not sit up on his own. He usually has a good appetite and eats regular food.  Review of Systems:  Unable to obtain due to patients mentation  Past Medical History: Past Medical History  Diagnosis Date  . Ulcerative colitis, unspecified   . Metabolic encephalopathy   . Unspecified intellectual disabilities   . Obesity, unspecified   . Hyperpotassemia   . Other convulsions   . Other lymphedema   . Unspecified constipation   . Other physical therapy   . Muscle weakness (generalized)    No past surgical history on file.  Medications: Prior to Admission medications   Medication Sig Start Date End Date Taking? Authorizing Provider  albuterol (PROVENTIL) (2.5 MG/3ML) 0.083% nebulizer solution Take 2.5 mg by nebulization every 6 (six) hours as needed.   Yes Historical Provider, MD  dextromethorphan (DELSYM) 30 MG/5ML liquid Take 60 mg by mouth as needed.   Yes Historical Provider, MD  guaiFENesin (MUCINEX) 600 MG 12 hr tablet Take 1,200 mg by mouth 2 (two) times daily.   Yes Historical Provider, MD  lactulose (CHRONULAC) 10 GM/15ML solution Take 30 g by mouth daily.   Yes Historical Provider, MD  OVER THE COUNTER MEDICATION Take 1 tablet by mouth See admin instructions. Swiss Kriss laxative 1 tablet with every meal.    Yes Historical Provider, MD  PHENObarbital (LUMINAL) 100 MG tablet Take 100 mg by mouth daily. Takes along with a 60mg  tablet for a total of 160mg    Yes Historical Provider, MD  PHENObarbital (LUMINAL) 60 MG tablet Take 60 mg by mouth daily. Takes along with a 100mg  tablet for a total dosing of 160mg    Yes Historical Provider, MD  phenytoin (DILANTIN) 125 MG/5ML suspension Take 200 mg by mouth 2 (two) times daily.   Yes Historical Provider, MD  potassium chloride (K-DUR) 10 MEQ tablet Take 20 mEq by mouth daily.   Yes Historical Provider, MD  simethicone (MYLICON) 40 MG/0.6ML drops Take 120 mg by mouth every 12 (twelve) hours.   Yes Historical Provider, MD    Allergies:  No Known Allergies  Social History:  reports that he has never smoked. He does not have any smokeless tobacco history on file. He reports that he does not drink alcohol. His drug history not on file.  Family History: No family history on file.  Physical Exam: Filed Vitals:   01/11/12 2215 01/11/12 2315 01/11/12 2330 01/11/12 2345  BP:  115/72 111/70 117/74  Pulse:   127   Temp:      TempSrc:      Resp: 27 27 22 23   SpO2:   94%     General:  Limp but arousable patient, Eyes: PERRLA, pale conjunctiva, no scleral icterus very pale ENT: dry oral mucosa, neck  supple, no thyromegaly Lungs: clear to ascultation, no wheeze, no crackles, no use of accessory muscles Cardiovascular: regular rate and rhythm, no regurgitation, no gallops, no murmurs. No carotid bruits, no JVD Abdomen: soft, decreased bowel sounds, non-tender, non-distended, no organomegaly, not an acute abdomen GU: not examined Neuro: Unable to assess neuro Musculoskeletal: Unable to assess strength, no clubbing, cyanosis or edema generalized muscle flacidity-chronic Skin: no rash, no subcutaneous crepitation, no decubitus    Labs on Admission:   [facility] 01/11/12 2319  NA 142  K 3.7  CL 104  CO2 25  GLUCOSE 208*  BUN 46*  CREATININE 1.79*    CALCIUM 9.1  MG --  PHOS --    Basename 01/11/12 2319  AST 11  ALT 10  ALKPHOS 154*  BILITOT 0.5  PROT 7.6  ALBUMIN 2.6*   No results found for this basename: LIPASE:2,AMYLASE:2 in the last 72 hours No results found for this basename: WBC:2,NEUTROABS:2,HGB:2,HCT:2,MCV:2,PLT:2 in the last 72 hours No results found for this basename: CKTOTAL:3,CKMB:3,CKMBINDEX:3,TROPONINI:3 in the last 72 hours No components found with this basename: POCBNP:3 No results found for this basename: DDIMER:2 in the last 72 hours No results found for this basename: HGBA1C:2 in the last 72 hours No results found for this basename: CHOL:2,HDL:2,LDLCALC:2,TRIG:2,CHOLHDL:2,LDLDIRECT:2 in the last 72 hours No results found for this basename: TSH,T4TOTAL,FREET3,T3FREE,THYROIDAB in the last 72 hours No results found for this basename: VITAMINB12:2,FOLATE:2,FERRITIN:2,TIBC:2,IRON:2,RETICCTPCT:2 in the last 72 hours  Micro Results: No results found for this or any previous visit (from the past 240 hour(s)).   Radiological Exams on Admission: Dg Abd Acute W/chest  01/11/2012  *RADIOLOGY REPORT*  Clinical Data: Fever.  Abdominal distention.  ACUTE ABDOMEN SERIES (ABDOMEN 2 VIEW & CHEST 1 VIEW)  Comparison: Chest dated 03/23/2010 and abdomen and pelvis CT dated 03/18/2010.  Findings: Interval dense opacity in the right mid and upper lung zones.  Clear left lung.  Grossly stable enlarged cardiac silhouette.  Prominent gas-filled redundant colon throughout the abdomen.  Small amount of fluid in the bowel loops in the decubitus position.  No free peritoneal air.  Diffuse osteopenia.  IMPRESSION:  1.  Dense right mid and upper lung zone pneumonia.  Follow-up chest radiographs are recommended until clearing to exclude an underlying mass. 2.  Stable cardiomegaly. 3.  Diffuse colonic ileus or distal obstruction.  Original Report Authenticated By: Darrol Angel, M.D.    CT ABDOMEN AND PELVIS WITH CONTRAST  Technique:  Multidetector CT imaging of the abdomen and pelvis was  performed following the standard protocol during bolus  administration of intravenous contrast.  Contrast: 80mL OMNIPAQUE IOHEXOL 300 MG/ML IV SOLN  Comparison: Abdominal radiograph performed 01/11/2012, and CT of  the abdomen and pelvis performed 03/18/2010  Findings: There is partial consolidation of the right lower lobe;  this raises concern for pneumonia, though significant atelectasis  could have a similar appearance. Scattered calcifications at the  right lung base likely reflect calcified granulomata.  The liver and spleen are unremarkable in appearance. The  gallbladder is within normal limits. The pancreas and adrenal  glands are unremarkable. A There is a large 1.6 x 1.0 cm stone lodged at the left renal  pelvis, with a 0.8 cm stone noted within a left renal calyx, and  scattered smaller left renal stones also seen. There is suggestion  of minimal hydronephrosis, raising concern for some degree of  obstruction. Soft tissue inflammation is noted about the largest  stone; mild infection cannot be excluded. The appearance raises  concern  for impending development of staghorn calculus, new from  the prior study.  A smaller 0.6 cm stone is noted within the right renal pelvis,  without significant soft tissue inflammation. There is also a  nonobstructing 6 mm stone at the upper pole of the right kidney.  Mild nonspecific perinephric stranding is noted bilaterally.  Delayed imaging demonstrates no evidence of contrast excretion from  either kidney; this raises concern for decreased renal function.  No free fluid is identified. The small bowel is largely  decompressed and unremarkable in appearance. The stomach is within  normal limits. No acute vascular abnormalities are seen.  The appendix is normal in caliber, without evidence for  appendicitis. The ascending and descending colon remain normal in  caliber, and there is only  mild distension of the transverse colon;  there is slightly more distension of the sigmoid colon; this  appears to reflect chronic underlying sigmoid inflammation given  prior evidence of colitis in 2011.  There is corresponding progressive colonic wall thickening along  the distal sigmoid colon and rectum, likely reflecting the prior  inflammatory process. No distal obstruction is identified.  Findings are not particularly suggestive of ileus.  A 1.3 cm soft tissue cyst along the mid back pain most likely  benign in nature, given its appearance.  The bladder is mildly distended and grossly unremarkable in  appearance. The prostate is normal in size, with scattered  calcification. No inguinal lymphadenopathy is seen.  No acute osseous abnormalities are identified. A  IMPRESSION:  1. No evidence of distal obstruction; colonic distension involves  predominantly the sigmoid colon, and is thought to reflect  underlying chronic sequelae of prior colitis. No definite evidence  of ileus. Mild colonic wall thickening along the distal sigmoid  colon and rectum is significantly improved from 2011, likely  reflects chronic underlying inflammation.  2. Large 1.6 x 1.0 cm stone lodged at the left renal pelvis, with  additional smaller left renal stones. Soft tissue inflammation  about this largest stone, with suggestion of minimal  hydronephrosis, raising concern for some degree of obstruction.  Infection cannot be excluded, given soft tissue inflammation; the  appearance raises concern for impending development of staghorn  calculus, new from the prior study.  3. Smaller 0.6 cm stone at the right renal pelvis; additional  nonobstructing right renal stones seen.  4. No evidence of contrast excretion from either kidney on delayed  images, raising concern for bilaterally decreased renal function.  5. Partial consolidation of the right lower lung lobe, concerning  for pneumonia, though significant  atelectasis could have a similar  appearance.    Assessment/Plan Present on Admission:  .Pneumonia hospital acquired Admit to MedSurg Blood cultures, urine and sputum cultures ordered Antibiotics for hospital-acquired pneumonia ordered Oxygen and nebulizers ordered White blood count done at outside lab is 29. We'll repeat CBC herre. Acute kidney injury IV fluid hydration ordered  nephrolithiasis  Patients POA Nani Skillern to visit in AM. Continue discussion Re: how aggressive an intervention is wanted. Consult Urology if needed History of ulcerative colitis Obesity Mental retardation Epilepsy Stable resume home medications  Patient will be made n.p.o. and speech will be consulted for swallowing evaluation Dilantin and phenobarbital levels ordered Single diarrhea in ER Per POA, patient seldom with BM. Monitoring for C difficile. No quinolones for HAP ordered.  DO NOT RESUSCITATE DVT prophylaxis Team 3/Dr. Lorel Monaco, Corlis Angelica 01/12/2012, 1:07 AM is a

## 2012-01-12 NOTE — Progress Notes (Signed)
ANTIBIOTIC CONSULT NOTE - INITIAL  Pharmacy Consult for Vancomycin, Zosyn, Levaquin Indication: pneumonia  No Known Allergies  Patient Measurements: Weight (estimated by RN) = 200 pounds Height (2006) = 66 inches  Vital Signs: Temp: 99 F (37.2 C) (02/18 0349) Temp src: Axillary (02/18 0349) BP: 175/81 mmHg (02/18 0349) Pulse Rate: 126  (02/18 0349) Intake/Output from previous day:   Intake/Output from this shift:    Labs:  Basename 01/12/12 0215 01/11/12 2319  WBC 27.5* --  HGB 13.8 --  PLT 190 --  LABCREA -- --  CREATININE 1.66* 1.79*   CrCl is unknown because there is no height on file for the current visit. No results found for this basename: VANCOTROUGH:2,VANCOPEAK:2,VANCORANDOM:2,GENTTROUGH:2,GENTPEAK:2,GENTRANDOM:2,TOBRATROUGH:2,TOBRAPEAK:2,TOBRARND:2,AMIKACINPEAK:2,AMIKACINTROU:2,AMIKACIN:2, in the last 72 hours   Microbiology: No results found for this or any previous visit (from the past 720 hour(s)).  Medical History: Past Medical History  Diagnosis Date  . Ulcerative colitis, unspecified   . Metabolic encephalopathy   . Unspecified intellectual disabilities   . Obesity, unspecified   . Hyperpotassemia   . Other convulsions   . Other lymphedema   . Unspecified constipation   . Other physical therapy   . Muscle weakness (generalized)     Medications:  Scheduled:    . cloNIDine  0.1 mg Transdermal Weekly  . enoxaparin  30 mg Subcutaneous Q24H  . lactulose  30 g Oral Daily  . levofloxacin (LEVAQUIN) IV  750 mg Intravenous Once  . PHENobarbital  64.8 mg Oral Daily  . PHENobarbital  97.2 mg Oral Daily  . phenytoin  200 mg Oral BID  . piperacillin-tazobactam (ZOSYN)  IV  3.375 g Intravenous Once  . sodium chloride  250 mL Intravenous Once  . vancomycin  1,000 mg Intravenous Once   Infusions:    . sodium chloride 75 mL/hr at 01/12/12 1610   Assessment: 56 year male who resides in nursing home to begin IV Vancomycin, Zosyn & Levaquin for  pneumonia confirmed by chest x-ray.  Dosing of antibiotics based on estimated weight by RN of 200 pounds (pt currently in a bed with no scale and pt is unable to provide verbal response at this time).  CrCl = 52 ml/min.  Patient received Vancomycin 1gm IV and Zosyn 3.375gm IV x 1 in ED @ 00:07 today.  Initial dose of Levaquin 750mg  IV x 1 given @ 04:42.  Goal of Therapy:  Vancomycin trough level 15-20 mcg/ml  Plan:  1.  Levaquin 750mg  IV q24h 2.  Zosyn 3.375gm IV q8h (extended infusion) 3.  Vancomycin 750mg  IV q12h 4.  Check vancomycin trough level as needed 5.  Follow up actual dose and confirm dosing regimens of antibiotics.  Maryellen Pile 01/12/2012,4:44 AM

## 2012-01-12 NOTE — Evaluation (Addendum)
Clinical/Bedside Swallow Evaluation Patient Details  Name: Charles Humphrey MRN: [id number] DOB: [birth year] Today's Date: 01/12/2012 [id number]  Past Medical History:  Past Medical History  Diagnosis Date  . Ulcerative colitis, unspecified   . Metabolic encephalopathy   . Unspecified intellectual disabilities   . Obesity, unspecified   . Hyperpotassemia   . Other convulsions   . Other lymphedema   . Unspecified constipation   . Other physical therapy   . Muscle weakness (generalized)   . Nephrolithiasis 01/12/2012  . Hydronephrosis 01/12/2012  . Healthcare-associated pneumonia 01/12/2012   Past Surgical History: No past surgical history on file. HPI:  56 yo male admitted to [facility] with fever of 101.9 and oxygen saturation of 89%., CXR 01/11/12 findings of dense right middle and upper lung zone pna, follow up chest radiographs recommended to exclude mass, diffuse chronic ileus and distal obstruction-stable cardiomegaly.    PMH + for convulsions, hyperpottassemia, lymphedema, constipation, obesity, muscle weakness, ulcerative colitis, metabolic encephalopathy.  Pt stopped ambulating approximately 6 years ago and is fed a regular diet by family at SNF.    Assessment/Recommendations/Treatment Plan Suspected Esophageal Findings Suspected Esophageal Findings: Other (comment) (kyphotic & distended abdomen may decr esophageal clearance)  SLP Assessment Clinical Impression Statement: Pt resides at [facility] and is fed by family every day.  Today, pt was resistant to take po- evidenced by him closing lips tightly and turning his head.  Father reports that sometimes they have to "trick Charles Humphrey to get him to eat."  Pt observed with liquid medicine *given by RN, water given via cup/straw, and a few bites icecream with initial cough but no further episodes.  Pt had secretions likely retained in pharynx that probably cleared with intake.  Lingual thrusting as seen previously continues.  Family reports  pt's respiratory status is much improved compared to previous.   Pt did not accept cracker offered by father.  Father reports this pt's swallow to be at baseline based on his observation today.  Of note, pt's roommate has been sick with ? pna per Charles Humphrey's father.  Pt has not had hospital admissions since April 2011 and has maintained his weight.  Recommend to consider initiating a regular diet with thin liquid, monitoring tolerance closely.  SLP educated father that this pt is a chronic aspiration risk and reviewed strategies to maximize airway protection.  Asp pna will likely be a chronic risk for this pt as he ages, relies on others for feeding, his dysphagia worsens and he is bedridden.  Thanks for this referral.    Risk for Aspiration: Moderate (chronic aspiration risk) Other Related Risk Factors: Decreased respiratory status;Lethargy;Cognitive impairment  Swallow Evaluation Recommendations Solid Consistency: Regular Liquid Consistency: Thin Liquid Administration via: Spoon;Cup;Straw Medication Administration: Crushed with puree Supervision: Staff feed patient Compensations: Slow rate;Small sips/bites Postural Changes and/or Swallow Maneuvers: Upright 30-60 min after meal Oral Care Recommendations: Oral care BID Follow up Recommendations: Skilled Nursing facility  Treatment Plan Speech Therapy Frequency: min 2x/week Treatment Duration: 1 week Interventions: Aspiration precaution training;Compensatory techniques;Patient/family education;Diet toleration management by SLP  Prognosis Barriers to Reach Goals: Cognitive deficits;Severity of dysphagia;Behavior  Individuals Consulted Consulted and Agree with Results and Recommendations: Patient  Swallowing Goals  SLP Swallowing Goals Patient will utilize recommended strategies during swallow to increase swallowing safety with: Total assistance  Swallow Study Prior Functional Status   Pt is resident of SNF, relies on others for feeding.    General  Date of Onset: 01/12/12 HPI: 56 yo male admitted to [facility] with  fever of 101.9 and oxygen saturation of 89%., CXR 01/11/12 findings of dense right middle and upper lung zone pna, follow up chest radiographs recommended to exclude mass, diffuse chronic ileus and distal obstruction-stable cardiomegaly.    PMH + for convulsions, hyperpottassemia, lymphedema, constipation, obesity, muscle weakness, ulcerative colitis, metabolic encephalopathy.  Pt stopped ambulating approximately 6 years ago and is fed a regular diet by family at SNF.  Type of Study: Bedside swallow evaluation Diet Prior to this Study: NPO Respiratory Status: Room air (oxygen found on floor, turned on to 3 liters-RN informed) Behavior/Cognition: Alert;Distractible;Doesn't follow directions;Other (comment) (pt with intellectual disability baseline-at SNF) Oral Cavity - Dentition: Adequate natural dentition Vision:  (pt is fed by family) Patient Positioning: Upright in bed Baseline Vocal Quality: Other (comment) (no phonation ) Volitional Cough: Cognitively unable to elicit Volitional Swallow: Unable to elicit  Oral Motor/Sensory Function  Overall Oral Motor/Sensory Function: Impaired at baseline (pt cognitively unable to participate in thorough eval) Labial Strength:  (pt able to seal lips closed tightly)  Consistency Results  Ice Chips Ice chips: Not tested  Thin Liquid Thin Liquid: Impaired Presentation: Cup;Spoon;Straw Oral Phase Impairments: Reduced lingual movement/coordination;Impaired anterior to posterior transit Oral Phase Functional Implications: Prolonged oral transit Pharyngeal  Phase Impairments: Cough - Delayed Other Comments: delayed cough x1 after given po medicine via spoon -father reported pt with retained secretions in pharynx initially,  pt fed by father- pt resistant to po today - father aiding pt by flexing his head upward and presenting bolus- pt accepted cup and straw sips of water without  clinical s/s of aspiration, lingual thrusting noted consistent with intellectual disabilities and dysphagia  Nectar Thick Liquid Nectar Thick Liquid: Not tested  Honey Thick Liquid Honey Thick Liquid: Not tested  Puree Puree: Impaired Presentation: Spoon Oral Phase Impairments: Reduced lingual movement/coordination;Impaired anterior to posterior transit Other Comments: icecream - only 3 boluses accepted with maximum encouragement  Solid Other Comments: pt sealed his lips tightly, did not open to accept cracker   [name], MS [facility] SLP [phone]   1316 Of note:  SLP phoned SNF SLP, who reports pt was on a soft diet when discharged from speech caseload a few years ago.  The SLP questioned if pt had a mild seizure prior to admission.

## 2012-01-12 NOTE — H&P (Addendum)
Charles Humphrey is an 56 y.o. male.   Chief Complaint: L nephrolithiasis HPI: Pt referred for L PCN. He is tachycardic and febrile. CT abdomen shows inflammatory change about L renal pelvis and B nephrolithiasis but no hydronephrosis.  Past Medical History  Diagnosis Date  . Ulcerative colitis, unspecified   . Metabolic encephalopathy   . Unspecified intellectual disabilities   . Obesity, unspecified   . Hyperpotassemia   . Other convulsions   . Other lymphedema   . Unspecified constipation   . Other physical therapy   . Muscle weakness (generalized)   . Nephrolithiasis 01/12/2012  . Hydronephrosis 01/12/2012  . Healthcare-associated pneumonia 01/12/2012    No past surgical history on file.  No family history on file. Social History:  reports that he has never smoked. He does not have any smokeless tobacco history on file. He reports that he does not drink alcohol. His drug history not on file.  Allergies: No Known Allergies  Medications Prior to Admission  Medication Dose Route Frequency Provider Last Rate Last Dose  . 0.9 %  sodium chloride infusion   Intravenous Continuous Debby Crosley, MD 75 mL/hr at 01/12/12 0559    . ipratropium (ATROVENT) nebulizer solution 0.5 mg  0.5 mg Nebulization Q4H PRN Debby Crosley, MD       And  . albuterol (PROVENTIL) (5 MG/ML) 0.5% nebulizer solution 2.5 mg  2.5 mg Nebulization Q4H PRN Debby Crosley, MD      . Chlorhexidine Gluconate Cloth 2 % PADS 6 each  6 each Topical Q0600 Hillery Aldo, MD   6 each at 01/12/12 1352  . cloNIDine (CATAPRES - Dosed in mg/24 hr) patch 0.1 mg  0.1 mg Transdermal Weekly Debby Crosley, MD   0.1 mg at 01/12/12 1143  . enoxaparin (LOVENOX) injection 30 mg  30 mg Subcutaneous Q24H Debby Crosley, MD   30 mg at 01/12/12 1227  . iohexol (OMNIPAQUE) 300 MG/ML solution 80 mL  80 mL Intravenous Once PRN Medication Radiologist, MD   80 mL at 01/12/12 0038  . lactulose (CHRONULAC) 10 GM/15ML solution 30 g  30 g Oral  Daily Debby Crosley, MD      . Levofloxacin (LEVAQUIN) IVPB 750 mg  750 mg Intravenous Once Leann Trefz Poindexter, PHARMD   750 mg at 01/12/12 0422  . mupirocin ointment (BACTROBAN) 2 % 1 application  1 application Nasal BID Hillery Aldo, MD   1 application at 01/12/12 1351  . PHENObarbital 20 MG/5ML elixir 160 mg  160 mg Oral Daily Hillery Aldo, MD      . PHENObarbital 20 MG/5ML elixir 60 mg  60 mg Oral Once Hillery Aldo, MD      . phenytoin (DILANTIN) 125 MG/5ML suspension 200 mg  200 mg Oral BID Debby Crosley, MD   200 mg at 01/12/12 1148  . piperacillin-tazobactam (ZOSYN) IVPB 3.375 g  3.375 g Intravenous Once Arman Filter, NP   3.375 g at 01/12/12 0007  . piperacillin-tazobactam (ZOSYN) IVPB 3.375 g  3.375 g Intravenous Q8H Leann Trefz Poindexter, PHARMD   3.375 g at 01/12/12 0724  . sodium chloride 0.9 % bolus 250 mL  250 mL Intravenous Once Arman Filter, NP   250 mL at 01/11/12 2326  . vancomycin (VANCOCIN) 750 mg in sodium chloride 0.9 % 150 mL IVPB  750 mg Intravenous Q12H Leann Trefz Poindexter, PHARMD   750 mg at 01/12/12 1330  . vancomycin (VANCOCIN) IVPB 1000 mg/200 mL premix  1,000 mg Intravenous Once Cipriano Mile  Manus Rudd, NP   1,000 mg at 01/12/12 0007  . DISCONTD: Levofloxacin (LEVAQUIN) IVPB 750 mg  750 mg Intravenous Q24H Leann Trefz Poindexter, PHARMD      . DISCONTD: PHENobarbital (LUMINAL) tablet 64.8 mg  64.8 mg Oral Daily Debby Crosley, MD      . DISCONTD: PHENobarbital (LUMINAL) tablet 97.2 mg  97.2 mg Oral Daily Debby Crosley, MD   97.2 mg at 01/12/12 1157   No current outpatient prescriptions on file as of 01/12/2012.    Results for orders placed during the hospital encounter of 01/11/12 (from the past 48 hour(s))  COMPREHENSIVE METABOLIC PANEL     Status: Abnormal   Collection Time   01/11/12 11:19 PM      Component Value Range Comment   Sodium 142  135 - 145 (mEq/L)    Potassium 3.7  3.5 - 5.1 (mEq/L)    Chloride 104  96 - 112 (mEq/L)    CO2 25  19 - 32 (mEq/L)      Glucose, Bld 208 (*) 70 - 99 (mg/dL)    BUN 46 (*) 6 - 23 (mg/dL)    Creatinine, Ser 1.61 (*) 0.50 - 1.35 (mg/dL)    Calcium 9.1  8.4 - 10.5 (mg/dL)    Total Protein 7.6  6.0 - 8.3 (g/dL)    Albumin 2.6 (*) 3.5 - 5.2 (g/dL)    AST 11  0 - 37 (U/L)    ALT 10  0 - 53 (U/L)    Alkaline Phosphatase 154 (*) 39 - 117 (U/L)    Total Bilirubin 0.5  0.3 - 1.2 (mg/dL)    GFR calc non Af Amer 42 (*) >90 (mL/min)    GFR calc Af Amer 48 (*) >90 (mL/min)   PHENYTOIN LEVEL, TOTAL     Status: Normal   Collection Time   01/12/12  2:15 AM      Component Value Range Comment   Phenytoin Lvl 16.9  10.0 - 20.0 (ug/mL)   PHENOBARBITAL LEVEL     Status: Normal   Collection Time   01/12/12  2:15 AM      Component Value Range Comment   Phenobarbital 25.7  15.0 - 40.0 (ug/mL)   BASIC METABOLIC PANEL     Status: Abnormal   Collection Time   01/12/12  2:15 AM      Component Value Range Comment   Sodium 141  135 - 145 (mEq/L)    Potassium 3.7  3.5 - 5.1 (mEq/L)    Chloride 104  96 - 112 (mEq/L)    CO2 25  19 - 32 (mEq/L)    Glucose, Bld 225 (*) 70 - 99 (mg/dL)    BUN 46 (*) 6 - 23 (mg/dL)    Creatinine, Ser 0.96 (*) 0.50 - 1.35 (mg/dL)    Calcium 8.7  8.4 - 10.5 (mg/dL)    GFR calc non Af Amer 46 (*) >90 (mL/min)    GFR calc Af Amer 53 (*) >90 (mL/min)   CBC     Status: Abnormal   Collection Time   01/12/12  2:15 AM      Component Value Range Comment   WBC 27.5 (*) 4.0 - 10.5 (K/uL) REPEATED TO VERIFY   RBC 4.47  4.22 - 5.81 (MIL/uL)    Hemoglobin 13.8  13.0 - 17.0 (g/dL)    HCT 04.5  40.9 - 81.1 (%)    MCV 95.1  78.0 - 100.0 (fL)    MCH 30.9  26.0 - 34.0 (pg)  MCHC 32.5  30.0 - 36.0 (g/dL)    RDW 81.1  91.4 - 78.2 (%)    Platelets 190  150 - 400 (K/uL)   MRSA PCR SCREENING     Status: Abnormal   Collection Time   01/12/12  4:03 AM      Component Value Range Comment   MRSA by PCR POSITIVE (*) NEGATIVE    URINALYSIS, ROUTINE W REFLEX MICROSCOPIC     Status: Abnormal   Collection Time    01/12/12  1:34 PM      Component Value Range Comment   Color, Urine YELLOW  YELLOW     APPearance TURBID (*) CLEAR     Specific Gravity, Urine 1.039 (*) 1.005 - 1.030     pH 5.5  5.0 - 8.0     Glucose, UA NEGATIVE  NEGATIVE (mg/dL)    Hgb urine dipstick MODERATE (*) NEGATIVE     Bilirubin Urine NEGATIVE  NEGATIVE     Ketones, ur NEGATIVE  NEGATIVE (mg/dL)    Protein, ur 956 (*) NEGATIVE (mg/dL)    Urobilinogen, UA 0.2  0.0 - 1.0 (mg/dL)    Nitrite NEGATIVE  NEGATIVE     Leukocytes, UA MODERATE (*) NEGATIVE    URINE MICROSCOPIC-ADD ON     Status: Abnormal   Collection Time   01/12/12  1:34 PM      Component Value Range Comment   Squamous Epithelial / LPF FEW (*) RARE     WBC, UA 11-20  <3 (WBC/hpf)    RBC / HPF 3-6  <3 (RBC/hpf)    Bacteria, UA MANY (*) RARE     Casts GRANULAR CAST (*) NEGATIVE     Crystals URIC ACID CRYSTALS (*) NEGATIVE     Urine-Other MUCOUS PRESENT   AMORPHOUS URATES/PHOSPHATES   Ct Abdomen Pelvis W Contrast  01/12/2012  *RADIOLOGY REPORT*  Clinical Data: Fever; ileus or distal obstruction suspected on abdominal radiograph.  Assess for ileus or obstruction.  CT ABDOMEN AND PELVIS WITH CONTRAST  Technique:  Multidetector CT imaging of the abdomen and pelvis was performed following the standard protocol during bolus administration of intravenous contrast.  Contrast: 80mL OMNIPAQUE IOHEXOL 300 MG/ML IV SOLN  Comparison: Abdominal radiograph performed 01/11/2012, and CT of the abdomen and pelvis performed 03/18/2010  Findings: There is partial consolidation of the right lower lobe; this raises concern for pneumonia, though significant atelectasis could have a similar appearance.  Scattered calcifications at the right lung base likely reflect calcified granulomata.  The liver and spleen are unremarkable in appearance.  The gallbladder is within normal limits.  The pancreas and adrenal glands are unremarkable.  There is a large 1.6 x 1.0 cm stone lodged at the left renal  pelvis, with a 0.8 cm stone noted within a left renal calyx, and scattered smaller left renal stones also seen.  There is suggestion of minimal hydronephrosis, raising concern for some degree of obstruction.  Soft tissue inflammation is noted about the largest stone; mild infection cannot be excluded.  The appearance raises concern for impending development of staghorn calculus, new from the prior study.  A smaller 0.6 cm stone is noted within the right renal pelvis, without significant soft tissue inflammation.  There is also a nonobstructing 6 mm stone at the upper pole of the right kidney. Mild nonspecific perinephric stranding is noted bilaterally.  Delayed imaging demonstrates no evidence of contrast excretion from either kidney; this raises concern for decreased renal function.  No free fluid is identified.  The small bowel is largely decompressed and unremarkable in appearance.  The stomach is within normal limits.  No acute vascular abnormalities are seen.  The appendix is normal in caliber, without evidence for appendicitis.  The ascending and descending colon remain normal in caliber, and there is only mild distension of the transverse colon; there is slightly more distension of the sigmoid colon; this appears to reflect chronic underlying sigmoid inflammation given prior evidence of colitis in 2011.  There is corresponding progressive colonic wall thickening along the distal sigmoid colon and rectum, likely reflecting the prior inflammatory process.  No distal obstruction is identified. Findings are not particularly suggestive of ileus.  A 1.3 cm soft tissue cyst along the mid back pain most likely benign in nature, given its appearance.  The bladder is mildly distended and grossly unremarkable in appearance.  The prostate is normal in size, with scattered calcification.  No inguinal lymphadenopathy is seen.  No acute osseous abnormalities are identified.  IMPRESSION:  1.  No evidence of distal  obstruction; colonic distension involves predominantly the sigmoid colon, and is thought to reflect underlying chronic sequelae of prior colitis.  No definite evidence of ileus.  Mild colonic wall thickening along the distal sigmoid colon and rectum is significantly improved from 2011, likely reflects chronic underlying inflammation. 2.  Large 1.6 x 1.0 cm stone lodged at the left renal pelvis, with additional smaller left renal stones.  Soft tissue inflammation about this largest stone, with suggestion of minimal hydronephrosis, raising concern for some degree of obstruction. Infection cannot be excluded, given soft tissue inflammation; the appearance raises concern for impending development of staghorn calculus, new from the prior study. 3.  Smaller 0.6 cm stone at the right renal pelvis; additional nonobstructing right renal stones seen. 4.  No evidence of contrast excretion from either kidney on delayed images, raising concern for bilaterally decreased renal function. 5.  Partial consolidation of the right lower lung lobe, concerning for pneumonia, though significant atelectasis could have a similar appearance.  Original Report Authenticated By: Tonia Ghent, M.D.   Dg Abd Acute W/chest  01/11/2012  *RADIOLOGY REPORT*  Clinical Data: Fever.  Abdominal distention.  ACUTE ABDOMEN SERIES (ABDOMEN 2 VIEW & CHEST 1 VIEW)  Comparison: Chest dated 03/23/2010 and abdomen and pelvis CT dated 03/18/2010.  Findings: Interval dense opacity in the right mid and upper lung zones.  Clear left lung.  Grossly stable enlarged cardiac silhouette.  Prominent gas-filled redundant colon throughout the abdomen.  Small amount of fluid in the bowel loops in the decubitus position.  No free peritoneal air.  Diffuse osteopenia.  IMPRESSION:  1.  Dense right mid and upper lung zone pneumonia.  Follow-up chest radiographs are recommended until clearing to exclude an underlying mass. 2.  Stable cardiomegaly. 3.  Diffuse colonic ileus or  distal obstruction.  Original Report Authenticated By: Darrol Angel, M.D.    ROS  Blood pressure 125/81, pulse 130, temperature 99 F (37.2 C), temperature source Axillary, resp. rate 22, SpO2 90.00%. Physical Exam  Constitutional: He appears well-developed and well-nourished.  Cardiovascular: Regular rhythm.        Tachycardic  Respiratory: Effort normal and breath sounds normal.  GI: Soft. He exhibits distension.     Assessment/Plan Possible L pyonephrosis without hydronephrosis. Will attempt L PCN tomorrow AM.  HOSS,ART A 01/12/2012, 4:16 PM  Discussed in detail procedure for left percutaneous nephrostomy placement.  Father is aware there is no significant hydronephrosis and that placement may be slightly traumatic;  however stone is in position such that hopeful visibility to successfully place.  They have verbalized their understanding and are in agreement to proceed. Written consent obtained.   Michael Litter, PA-C

## 2012-01-13 ENCOUNTER — Inpatient Hospital Stay (HOSPITAL_COMMUNITY): Payer: Medicare Other

## 2012-01-13 DIAGNOSIS — A419 Sepsis, unspecified organism: Secondary | ICD-10-CM | POA: Diagnosis present

## 2012-01-13 DIAGNOSIS — E870 Hyperosmolality and hypernatremia: Secondary | ICD-10-CM | POA: Diagnosis present

## 2012-01-13 DIAGNOSIS — E876 Hypokalemia: Secondary | ICD-10-CM | POA: Diagnosis present

## 2012-01-13 LAB — CLOSTRIDIUM DIFFICILE BY PCR: Toxigenic C. Difficile by PCR: POSITIVE — AB

## 2012-01-13 LAB — CBC
HCT: 37.3 % — ABNORMAL LOW (ref 39.0–52.0)
MCH: 31.2 pg (ref 26.0–34.0)
MCHC: 33.5 g/dL (ref 30.0–36.0)
MCV: 93 fL (ref 78.0–100.0)
RDW: 15.8 % — ABNORMAL HIGH (ref 11.5–15.5)
WBC: 21.2 10*3/uL — ABNORMAL HIGH (ref 4.0–10.5)

## 2012-01-13 LAB — BASIC METABOLIC PANEL
BUN: 45 mg/dL — ABNORMAL HIGH (ref 6–23)
CO2: 21 mEq/L (ref 19–32)
Chloride: 117 mEq/L — ABNORMAL HIGH (ref 96–112)
Creatinine, Ser: 1.34 mg/dL (ref 0.50–1.35)
Glucose, Bld: 112 mg/dL — ABNORMAL HIGH (ref 70–99)

## 2012-01-13 MED ORDER — METRONIDAZOLE IN NACL 5-0.79 MG/ML-% IV SOLN
500.0000 mg | Freq: Three times a day (TID) | INTRAVENOUS | Status: DC
  Administered 2012-01-13 – 2012-01-16 (×9): 500 mg via INTRAVENOUS
  Filled 2012-01-13 (×12): qty 100

## 2012-01-13 MED ORDER — IOHEXOL 300 MG/ML  SOLN: 35.0000 mL | Freq: Once | INTRAMUSCULAR | Status: AC | PRN

## 2012-01-13 MED ORDER — ENOXAPARIN SODIUM 40 MG/0.4ML ~~LOC~~ SOLN
40.0000 mg | SUBCUTANEOUS | Status: DC
  Administered 2012-01-14 – 2012-01-20 (×7): 40 mg via SUBCUTANEOUS
  Filled 2012-01-13 (×8): qty 0.4

## 2012-01-13 MED ORDER — MIDAZOLAM HCL 5 MG/5ML IJ SOLN
INTRAMUSCULAR | Status: AC | PRN
  Administered 2012-01-13 (×2): 2 mg via INTRAVENOUS

## 2012-01-13 MED ORDER — VANCOMYCIN 50 MG/ML ORAL SOLUTION
500.0000 mg | Freq: Four times a day (QID) | ORAL | Status: DC
  Administered 2012-01-13 – 2012-01-20 (×28): 500 mg via ORAL
  Filled 2012-01-13 (×31): qty 10

## 2012-01-13 MED ORDER — METRONIDAZOLE IN NACL 5-0.79 MG/ML-% IV SOLN
500.0000 mg | Freq: Three times a day (TID) | INTRAVENOUS | Status: DC
  Filled 2012-01-13 (×3): qty 100

## 2012-01-13 MED ORDER — POTASSIUM CHLORIDE IN NACL 20-0.45 MEQ/L-% IV SOLN
INTRAVENOUS | Status: DC
  Administered 2012-01-13: 19:00:00 via INTRAVENOUS
  Filled 2012-01-13 (×6): qty 1000

## 2012-01-13 MED ORDER — FENTANYL CITRATE 0.05 MG/ML IJ SOLN
INTRAMUSCULAR | Status: AC | PRN
  Administered 2012-01-13: 100 ug via INTRAVENOUS
  Administered 2012-01-13 (×2): 50 ug via INTRAVENOUS

## 2012-01-13 NOTE — Progress Notes (Signed)
INITIAL ADULT NUTRITION ASSESSMENT Date: 01/13/2012   Time: 9:44 AM  Reason for Assessment: Consult, Nutrition Risk Report, Low Braden  ASSESSMENT: Male 56 y.o.  Dx: Healthcare-associated pneumonia  Hx:  Past Medical History  Diagnosis Date  . Ulcerative colitis, unspecified   . Metabolic encephalopathy   . Unspecified intellectual disabilities   . Obesity, unspecified   . Hyperpotassemia   . Other convulsions   . Other lymphedema   . Unspecified constipation   . Other physical therapy   . Muscle weakness (generalized)   . Nephrolithiasis 01/12/2012  . Hydronephrosis 01/12/2012  . Healthcare-associated pneumonia 01/12/2012    Related Meds:     . Chlorhexidine Gluconate Cloth  6 each Topical Q0600  . cloNIDine  0.1 mg Transdermal Weekly  . enoxaparin  30 mg Subcutaneous Q24H  . lactulose  30 g Oral Daily  . mupirocin ointment  1 application Nasal BID  . PHENObarbital  160 mg Oral Daily  . PHENObarbital  60 mg Oral Once  . phenytoin  200 mg Oral BID  . piperacillin-tazobactam (ZOSYN)  IV  3.375 g Intravenous Q8H  . vancomycin  750 mg Intravenous Q12H  . DISCONTD: PHENobarbital  64.8 mg Oral Daily  . DISCONTD: PHENobarbital  97.2 mg Oral Daily    Ht: 5\' 5"  (165.1 cm)  Wt: 200 lb (90.719 kg)  Ideal Wt: 61.81 kg % Ideal Wt: 146.7%  Usual Wt: 187 lb/ 85 kg (per father report, weight taken at SNF) % Usual Wt: 106.7%  Body mass index is 33.28 kg/(m^2). Obesity Class I  Food/Nutrition Related Hx: Per pt's father, pt usually has a very good appetite and eats 100% of meals. Pt has lived at [facility] for the last 7 years; pt's father comes and feeds him lunch everyday. Pt on regular diet with thin liquids PTA. Pt requires full assistance with meals and nurses at SNF feed pt breakfast and dinner with no problem. Pt with 0% PO intake for 3 days PTA per pt's father. Pt does not walk and stopped ambulating 6 years ago per H&P and does not sit up on his own - appears essentially bed  bound. No pressure ulcers documented on admission.   Labs:  CMP     Component Value Date/Time   NA 153* 01/13/2012 0525   K 3.4* 01/13/2012 0525   CL 117* 01/13/2012 0525   CO2 21 01/13/2012 0525   GLUCOSE 112* 01/13/2012 0525   BUN 45* 01/13/2012 0525   CREATININE 1.34 01/13/2012 0525   CALCIUM 8.8 01/13/2012 0525   PROT 7.6 01/11/2012 2319   ALBUMIN 2.6* 01/11/2012 2319   AST 11 01/11/2012 2319   ALT 10 01/11/2012 2319   ALKPHOS 154* 01/11/2012 2319   BILITOT 0.5 01/11/2012 2319   GFRNONAA 59* 01/13/2012 0525   GFRAA 68* 01/13/2012 0525  Electrolytes WNL yesterday and now sodium and chloride are high. Pt is receiving IVF sodium chloride at 75 ml/hr.    Intake/Output Summary (Last 24 hours) at 01/13/12 1031 Last data filed at 01/13/12 0537  Gross per 24 hour  Intake      0 ml  Output    975 ml  Net   -975 ml   Last BM- 01/13/12  Diet Order: NPO  Supplements/Tube Feeding: N/A  IVF:    sodium chloride Last Rate: 75 mL/hr at 01/13/12 0750    Estimated Nutritional Needs:   Kcal:1800-2000 Protein:110-125 grams Fluid:>1.9 L  Pt unable to speak, pt's parents were present in the room. Per  RN, pt is positive for C.Diff and MRSA.   Pt has refused to eat for the nursing staff here. Pt's father denied, pt's refusal to eat was due to pt unfamiliar with the nurses here. Pt's father thinks his decreased appetite was due to his impacted bowels. Per pt's family, pt's stomach was very distended yesterday. Per doc flowsheets, pt's abdomen is still somewhat distended, despite pt having diarrhea yesterday.   Pt is currently NPO but pt's father hopes he will eat his meal tonight when diet resumed.   Pt is having a nephrostomy tube placed today as pt was found to have significant nephrolithiasis with an obstructing stone in left renal pelvis. Per SLP evaluation, she recommended a regular diet with thin liquids. SLP noted pt is at moderate chronic aspiration risk with any diet.  NUTRITION  DIAGNOSIS: -Inadequate oral intake (NI-2.1).  Status: Ongoing  RELATED TO: poor appetite, inability to eat  AS EVIDENCE BY: PO intake 0% for 3 days, current NPO status  MONITORING/EVALUATION(Goals): Goal:  1. Diet advancement back to Regular. 2. PO intake to meet >/=90% estimated nutrition needs, when diet is advanced. Monitor: diet advancement, PO intake, weight trends, labs, I/O's  EDUCATION NEEDS: -No education needs identified at this time  INTERVENTION:  No nutrition supplements at this time; monitor PO intake first to see if appetite is back to baseline.  Hope appetite will increase now that constipation has now resolved and abdomen distention improving.   Recommend Florastor to help with diarrhea.   Dietitian #:[phone]  DOCUMENTATION CODES Per approved criteria  -Obesity Unspecified    Karenann Cai 01/13/2012, 9:44 AM

## 2012-01-13 NOTE — Progress Notes (Signed)
   ST Cancellation Note 1539 _X__Treatment cancelled today due to pt remaining npo, note pt was for procedure today.  SLP to return next date.  Thanks.  Signature:   [name], MS [facility] SLP    [phone]

## 2012-01-13 NOTE — Progress Notes (Signed)
PATIENT DETAILS Name: Charles Humphrey Age: 56 y.o. Sex: male Date of Birth: 1956-03-14 Admit Date: 01/11/2012 PCP:No primary provider on file. Emergency contact: Charles Humphrey (father) [phone] (h), [phone] (c)   Medical Consults:  Dr. Bjorn Pippin, Urology  Interventional Radiology  Other Consults:  Pharmacy for Antibiotic dosing  Dietician  Brief Narrative: [name] is a 56 year-old gentleman resident of [facility] with a PMH of mental retardation.  At the nursing home he had a fever up to 101.9 and was hypoxic 89% on RA. Workup done there revealed a pneumonia and a white blood count of 29. He was sent to the ER and was admitted for treatment of HAP.  He was also found to have significant nephrolithiasis with an obstructing stone left renal pelvis and hydronephrosis.  He was seen by urology on 01/12/12 with plans to place a percutaneous nephrostomy tube.  He also has been diagnosed with clostridium difficile colitis.  Interval History: Stable over night.  ROS: The patient is non-verbal.    Objective: Vital signs in last 24 hours: Temp:  [98.4 F (36.9 C)-99 F (37.2 C)] 98.4 F (36.9 C) (02/19 0535) Pulse Rate:  [109-120] 109  (02/19 0535) Resp:  [20] 20  (02/19 0535) BP: (105-126)/(72-86) 105/72 mmHg (02/19 0535) SpO2:  [92 %-93 %] 93 % (02/19 0535) Weight change:  Last BM Date: 01/13/12  Intake/Output from previous day:  Intake/Output Summary (Last 24 hours) at 01/13/12 1448 Last data filed at 01/13/12 0537  Gross per 24 hour  Intake      0 ml  Output    975 ml  Net   -975 ml     Physical Exam:  Gen:  NAD Cardiovascular:  Tachycardic, No M/R/G Respiratory: Lungs CTAB Gastrointestinal: Abdomen soft, NT/ND with normal active bowel sounds. Extremities: No C/E/C, LE contractures   Lab Results: Basic Metabolic Panel:  Lab 01/13/12 6578 01/12/12 0215 01/11/12 2319  NA 153* 141 142  K 3.4* 3.7 --  CL 117* 104 104    CO2 21 25 25   GLUCOSE 112* 225* 208*  BUN 45* 46* 46*  CREATININE 1.34 1.66* 1.79*  CALCIUM 8.8 8.7 9.1  MG -- -- --  PHOS -- -- --   GFR Estimated Creatinine Clearance: 66 ml/min (by C-G formula based on Cr of 1.34). Liver Function Tests:  Lab 01/11/12 2319  AST 11  ALT 10  ALKPHOS 154*  BILITOT 0.5  PROT 7.6  ALBUMIN 2.6*    CBC:  Lab 01/13/12 0525 01/12/12 0215  WBC 21.2* 27.5*  NEUTROABS -- --  HGB 12.5* 13.8  HCT 37.3* 42.5  MCV 93.0 95.1  PLT 205 190    Ref. Range 01/13/2012 05:25  Hemoglobin A1C Latest Range: <5.7 % 5.5   Microbiology Recent Results (from the past 240 hour(s))  CULTURE, BLOOD (ROUTINE X 2)     Status: Normal (Preliminary result)   Collection Time   01/12/12 12:00 AM      Component Value Range Status Comment   Specimen Description BLOOD LEFT HAND   Final    Special Requests BOTTLES DRAWN AEROBIC ONLY 2 CC   Final    Culture  Setup Time [id number]   Final    Culture     Final    Value:        BLOOD CULTURE RECEIVED NO GROWTH TO DATE CULTURE WILL BE HELD FOR 5 DAYS BEFORE ISSUING A FINAL NEGATIVE REPORT   Report Status PENDING   Incomplete  CULTURE, BLOOD (ROUTINE X 2)     Status: Normal (Preliminary result)   Collection Time   01/12/12  2:15 AM      Component Value Range Status Comment   Specimen Description BLOOD LEFT FINGER   Final    Special Requests BOTTLES DRAWN AEROBIC ONLY 5 CC   Final    Culture  Setup Time [id number]   Final    Culture     Final    Value:        BLOOD CULTURE RECEIVED NO GROWTH TO DATE CULTURE WILL BE HELD FOR 5 DAYS BEFORE ISSUING A FINAL NEGATIVE REPORT   Report Status PENDING   Incomplete   MRSA PCR SCREENING     Status: Abnormal   Collection Time   01/12/12  4:03 AM      Component Value Range Status Comment   MRSA by PCR POSITIVE (*) NEGATIVE  Final   CLOSTRIDIUM DIFFICILE BY PCR     Status: Abnormal   Collection Time   01/13/12  7:50 AM      Component Value Range Status Comment   C difficile by pcr  POSITIVE (*) NEGATIVE  Final       Studies/Results:  Ct Abdomen Pelvis W Contrast 01/12/2012   IMPRESSION:  1.  No evidence of distal obstruction; colonic distension involves predominantly the sigmoid colon, and is thought to reflect underlying chronic sequelae of prior colitis.  No definite evidence of ileus.  Mild colonic wall thickening along the distal sigmoid colon and rectum is significantly improved from 2011, likely reflects chronic underlying inflammation. 2.  Large 1.6 x 1.0 cm stone lodged at the left renal pelvis, with additional smaller left renal stones.  Soft tissue inflammation about this largest stone, with suggestion of minimal hydronephrosis, raising concern for some degree of obstruction. Infection cannot be excluded, given soft tissue inflammation; the appearance raises concern for impending development of staghorn calculus, new from the prior study. 3.  Smaller 0.6 cm stone at the right renal pelvis; additional nonobstructing right renal stones seen. 4.  No evidence of contrast excretion from either kidney on delayed images, raising concern for bilaterally decreased renal function. 5.  Partial consolidation of the right lower lung lobe, concerning for pneumonia, though significant atelectasis could have a similar appearance.  Original Report Authenticated By: Tonia Ghent, M.D.    Dg Abd Acute W/chest 01/11/2012 IMPRESSION:  1.  Dense right mid and upper lung zone pneumonia.  Follow-up chest radiographs are recommended until clearing to exclude an underlying mass. 2.  Stable cardiomegaly. 3.  Diffuse colonic ileus or distal obstruction.  Original Report Authenticated By: Darrol Angel, M.D.    Medications: Scheduled Meds:    . Chlorhexidine Gluconate Cloth  6 each Topical Q0600  . cloNIDine  0.1 mg Transdermal Weekly  . enoxaparin  40 mg Subcutaneous Q24H  . lactulose  30 g Oral Daily  . metronidazole  500 mg Intravenous Q8H  . mupirocin ointment  1 application Nasal BID    . PHENObarbital  160 mg Oral Daily  . PHENObarbital  60 mg Oral Once  . phenytoin  200 mg Oral BID  . piperacillin-tazobactam (ZOSYN)  IV  3.375 g Intravenous Q8H  . vancomycin  750 mg Intravenous Q12H  . DISCONTD: enoxaparin  30 mg Subcutaneous Q24H   Continuous Infusions:    . sodium chloride 75 mL/hr at 01/13/12 1143   PRN Meds:.albuterol, ipratropium Antibiotics: Anti-infectives     Start     Dose/Rate  Route Frequency Ordered Stop   01/13/12 1300   metroNIDAZOLE (FLAGYL) IVPB 500 mg        500 mg 100 mL/hr over 60 Minutes Intravenous Every 8 hours 01/13/12 1217     01/13/12 0500   Levofloxacin (LEVAQUIN) IVPB 750 mg  Status:  Discontinued        750 mg 100 mL/hr over 90 Minutes Intravenous Every 24 hours 01/12/12 0450 01/12/12 0600   01/12/12 1200   vancomycin (VANCOCIN) 750 mg in sodium chloride 0.9 % 150 mL IVPB        750 mg 150 mL/hr over 60 Minutes Intravenous Every 12 hours 01/12/12 0450     01/12/12 0800  piperacillin-tazobactam (ZOSYN) IVPB 3.375 g       3.375 g 12.5 mL/hr over 240 Minutes Intravenous Every 8 hours 01/12/12 0450     01/12/12 0400   Levofloxacin (LEVAQUIN) IVPB 750 mg        750 mg 100 mL/hr over 90 Minutes Intravenous  Once 01/12/12 0334 01/12/12 0552   01/12/12 0015   vancomycin (VANCOCIN) IVPB 1000 mg/200 mL premix        1,000 mg 200 mL/hr over 60 Minutes Intravenous  Once 01/11/12 2350 01/12/12 0107   01/12/12 0015  piperacillin-tazobactam (ZOSYN) IVPB 3.375 g       3.375 g 12.5 mL/hr over 240 Minutes Intravenous  Once 01/11/12 2350 01/12/12 0407           Assessment/Plan:  Principal Problem:  *Healthcare-associated pneumonia / Leukocytosis / Sepsis The patient was admitted and blood, urine and sputum cultures were ordered, and are all pending.  He is felt to have multiple infectious sources including his urinary tract, pneumonia and clostridium difficile colitis.  He is on day #3 empiric Levaquin, Zosyn and Vancomycin; and day  #1 oral Vancomycin and IV Flagyl.  Oxygen saturations are stable.  Monitoring leukocytosis.  Speech therapy evaluation has been requested, due to concerns for aspiration.  A percutaneous nephrostomy tube is planned. Active Problems:  Epilepsy The patient is being maintained on his home doses of dilantin and phenobarbital.  No seizure events noted in the hospital.  Mental retardation The patient understands "yes or no" but no other communication. He does not talk and stopped ambulating ~ 6 yrs ago. He does not sit up on his own. He usually has a good appetite and eats regular food.  ARF (acute renal failure) / Nephrolithiasis /Hydronephrosis Status post urological consultation.  A percutaneous nephrostomy tube is planned.  Will need outpatient lithotripsy when stable.  Clostridium difficile colitis Known history of ulcerative colitis.  No evidence of obstruction or ileus.  Stool for clostridium difficile by PCR noted to be positive.  Started on IV Flagyl and oral Vancomycin.  Hyperglycemia Hemoglobin A1c is normal at 5.5%.    LOS: 2 days   Hillery Aldo, MD Pager [phone]  01/13/2012, 2:48 PM

## 2012-01-13 NOTE — Procedures (Signed)
Successful placement of left sided 10 Fr PCN, coiled within the superior aspect of the right kidney.  No immediate complications.

## 2012-01-14 DIAGNOSIS — N179 Acute kidney failure, unspecified: Secondary | ICD-10-CM | POA: Diagnosis present

## 2012-01-14 LAB — BASIC METABOLIC PANEL
BUN: 30 mg/dL — ABNORMAL HIGH (ref 6–23)
CO2: 22 mEq/L (ref 19–32)
Calcium: 8.8 mg/dL (ref 8.4–10.5)
Creatinine, Ser: 0.94 mg/dL (ref 0.50–1.35)
Glucose, Bld: 117 mg/dL — ABNORMAL HIGH (ref 70–99)

## 2012-01-14 LAB — VANCOMYCIN, TROUGH: Vancomycin Tr: 20.1 ug/mL — ABNORMAL HIGH (ref 10.0–20.0)

## 2012-01-14 LAB — CREATININE, SERUM: GFR calc Af Amer: >90 mL/min (ref >90)

## 2012-01-14 MED ORDER — DEXTROSE-NACL 5-0.45 % IV SOLN: INTRAVENOUS | Status: DC

## 2012-01-14 MED ORDER — DEXTROSE 5 % IV SOLN
INTRAVENOUS | Status: DC
  Administered 2012-01-14 – 2012-01-18 (×8): via INTRAVENOUS
  Administered 2012-01-19: 75 mL via INTRAVENOUS

## 2012-01-14 NOTE — Progress Notes (Signed)
Critical sodium result of 164 called in from lab.  Noted that Na+ level has already been elevated and MD aware and noted hypenatremia in her note today.  IVF was ordered to be decreased to 75/hr, was at 125/hr.  So did not call MD with value due to these new orders regarding IVF and also per MD note.

## 2012-01-14 NOTE — Progress Notes (Signed)
Subjective: Per family patient is more agitated but mentation improved from admission.  Objective: Vital signs in last 24 hours: Filed Vitals:   01/13/12 1908 01/13/12 2242 01/14/12 0547 01/14/12 1400  BP: 118/79 127/83 130/80 138/76  Pulse: 107 110 111 102  Temp: 98.5 F (36.9 C) 98.6 F (37 C) 98.4 F (36.9 C) 97.6 F (36.4 C)  TempSrc: Axillary Axillary Axillary Axillary  Resp: 16 20 21 18   Height:      Weight:      SpO2: 75% 91% 93% 91%   Weight change:   Intake/Output Summary (Last 24 hours) at 01/14/12 1612 Last data filed at 01/14/12 1000  Gross per 24 hour  Intake     80 ml  Output   2325 ml  Net  -2245 ml    Physical Exam: General: Awake, No acute distress. HEENT: EOMI. Neck: Supple CV: S1 and S2 Lungs: Clear to ascultation bilaterally Abdomen: Soft, Nontender, Nondistended, +bowel sounds. Ext: Good pulses. Trace edema. Back: Percutaneous drain in place.  Lab Results:  Basename 01/14/12 1229 01/13/12 0525 01/12/12 0215  NA -- 153* 141  K -- 3.4* 3.7  CL -- 117* 104  CO2 -- 21 25  GLUCOSE -- 112* 225*  BUN -- 45* 46*  CREATININE 0.91 1.34 --  CALCIUM -- 8.8 8.7  MG -- -- --  PHOS -- -- --    Basename 01/11/12 2319  AST 11  ALT 10  ALKPHOS 154*  BILITOT 0.5  PROT 7.6  ALBUMIN 2.6*   No results found for this basename: LIPASE:2,AMYLASE:2 in the last 72 hours  Basename 01/13/12 0525 01/12/12 0215  WBC 21.2* 27.5*  NEUTROABS -- --  HGB 12.5* 13.8  HCT 37.3* 42.5  MCV 93.0 95.1  PLT 205 190   No results found for this basename: CKTOTAL:3,CKMB:3,CKMBINDEX:3,TROPONINI:3 in the last 72 hours No components found with this basename: POCBNP:3 No results found for this basename: DDIMER:2 in the last 72 hours  Basename 01/13/12 0525  HGBA1C 5.5   No results found for this basename: CHOL:2,HDL:2,LDLCALC:2,TRIG:2,CHOLHDL:2,LDLDIRECT:2 in the last 72 hours No results found for this basename: TSH,T4TOTAL,FREET3,T3FREE,THYROIDAB in the last 72  hours No results found for this basename: VITAMINB12:2,FOLATE:2,FERRITIN:2,TIBC:2,IRON:2,RETICCTPCT:2 in the last 72 hours  Micro Results: Recent Results (from the past 240 hour(s))  CULTURE, BLOOD (ROUTINE X 2)     Status: Normal (Preliminary result)   Collection Time   01/12/12 12:00 AM      Component Value Range Status Comment   Specimen Description BLOOD LEFT HAND   Final    Special Requests BOTTLES DRAWN AEROBIC ONLY 2 CC   Final    Culture  Setup Time [id number]   Final    Culture     Final    Value:        BLOOD CULTURE RECEIVED NO GROWTH TO DATE CULTURE WILL BE HELD FOR 5 DAYS BEFORE ISSUING A FINAL NEGATIVE REPORT   Report Status PENDING   Incomplete   CULTURE, BLOOD (ROUTINE X 2)     Status: Normal (Preliminary result)   Collection Time   01/12/12  2:15 AM      Component Value Range Status Comment   Specimen Description BLOOD LEFT FINGER   Final    Special Requests BOTTLES DRAWN AEROBIC ONLY 5 CC   Final    Culture  Setup Time [id number]   Final    Culture     Final    Value:        BLOOD  CULTURE RECEIVED NO GROWTH TO DATE CULTURE WILL BE HELD FOR 5 DAYS BEFORE ISSUING A FINAL NEGATIVE REPORT   Report Status PENDING   Incomplete   MRSA PCR SCREENING     Status: Abnormal   Collection Time   01/12/12  4:03 AM      Component Value Range Status Comment   MRSA by PCR POSITIVE (*) NEGATIVE  Final   CLOSTRIDIUM DIFFICILE BY PCR     Status: Abnormal   Collection Time   01/13/12  7:50 AM      Component Value Range Status Comment   C difficile by pcr POSITIVE (*) NEGATIVE  Final     Studies/Results: Ir Perc Nephrostomy Left  01/13/2012  *RADIOLOGY REPORT*  Indication:  Obstructing left sided UPJ stone with concern for infected collecting system.  1.  FLUOROSCOPIC GUIDANCE FOR PUNCTURE OF THE LEFT RENAL COLLECTING SYSTEM. 2. LEFT SIDED PERCUTANEOUS NEPHROSTOMY TUBE PLACEMENT  Comparison: CT of the abdomen and pelvis - 01/12/2012; 03/18/2010  Intravenous medications: Fentanyl 200  mcg IV; Versed 4 mg IV; The patient is currently admitted to the hospital and on intravenous antibiotics. The antibiotic was administered in an appropriate time frame prior to skin puncture.  Total Moderate Sedation Time: 65 minutes.  Contrast: 60 mL Omnipaque-300 (administered into the collecting system)  Fluoroscopy Time: 17.1 minutes.  Complications: None immediate  Procedure:  Informed written consent was obtained from the patient's family after a discussion of the risks, benefits, and alternatives to treatment.  The left flank region was prepped with Betadine in a sterile fashion, and a sterile drape was applied covering the operative field.  A sterile gown and sterile gloves were used for the procedure.  A timeout was performed prior to the initiation of the procedure.  A pre procedural spot fluoroscopic image was obtained of the upper abdomen.  Ultrasound scanning performed of the kidney was negative for significant hydronephrosis.  As such, the stone within left UPJ was targeted fluoroscopically with a 22 gauge Chiba needle.  Access to the collecting system was confirmed with advancement of a Nitrex wire into the collecting system.  The needle was exchanged for the inner 3 French catheter from an Accustick set and contrast injection confirmed access.  A 3-French catheter was utilized to maintain access to the collecting system due to the patient's spontaneous movement on the fluoroscopy table and inability to cooperate with the procedure.  A small amount of air was injected into the collecting system to help delineate a posterior calyx.  A posterior calyx was targeted with a 22 gauge Chiba needle.  Access to the calyx was confirmed with advancement of a Nitrex wire into the collecting system.  An Accustick set was utilized to dilate the tract. Over a Benson wire, the tract was serially dilated to 10- [place].  Initially a 10-French percutaneous nephrostomy tube was advanced to the superior aspect of the left  ureter, however given the small size of the renal pelvis, the nephrostomy was unable to be coiled.  As such, this catheter was exchanged for a 10-French Renee Pain catheter which was ultimately coiled within the superior aspect of the collecting system. Postprocedural spot radiographs were obtained in various obliquities and the catheter was sutured to the skin.  The catheter was capped and a dressing was placed.  The patient tolerated the procedure well without immediate postprocedural complication.  Findings:  Pre procedural spot radiographic images demonstrates an approximately 1.1 x 5.5 cm stone overlying the expected location of the  left UPJ, as demonstrated on preprocedural abdominal CT. Several additional smaller stones are identified within the left kidney.  Ultrasound scanning was negative for significant hydronephrosis and as such, the UPJ stone was targeted fluoroscopically allowing access to the collecting system.  With the collecting system now opacified, a posterior calyx was targeted fluoroscopically ultimately allowing placement of a 10-French Renee Pain catheter coiled and locked within the superior aspect of the left collecting system.  IMPRESSION:  Successful fluoroscopic guided placement of a left sided 10-French Renee Pain catheter coiled within the superior aspect of the left collecting system.  Original Report Authenticated By: Waynard Reeds, M.D.   Ir US Guide Bx Asp/drain  01/13/2012  *RADIOLOGY REPORT*  Indication:  Obstructing left sided UPJ stone with concern for infected collecting system.  1.  FLUOROSCOPIC GUIDANCE FOR PUNCTURE OF THE LEFT RENAL COLLECTING SYSTEM. 2. LEFT SIDED PERCUTANEOUS NEPHROSTOMY TUBE PLACEMENT  Comparison: CT of the abdomen and pelvis - 01/12/2012; 03/18/2010  Intravenous medications: Fentanyl 200 mcg IV; Versed 4 mg IV; The patient is currently admitted to the hospital and on intravenous antibiotics. The antibiotic was administered in an  appropriate time frame prior to skin puncture.  Total Moderate Sedation Time: 65 minutes.  Contrast: 60 mL Omnipaque-300 (administered into the collecting system)  Fluoroscopy Time: 17.1 minutes.  Complications: None immediate  Procedure:  Informed written consent was obtained from the patient's family after a discussion of the risks, benefits, and alternatives to treatment.  The left flank region was prepped with Betadine in a sterile fashion, and a sterile drape was applied covering the operative field.  A sterile gown and sterile gloves were used for the procedure.  A timeout was performed prior to the initiation of the procedure.  A pre procedural spot fluoroscopic image was obtained of the upper abdomen.  Ultrasound scanning performed of the kidney was negative for significant hydronephrosis.  As such, the stone within left UPJ was targeted fluoroscopically with a 22 gauge Chiba needle.  Access to the collecting system was confirmed with advancement of a Nitrex wire into the collecting system.  The needle was exchanged for the inner 3 French catheter from an Accustick set and contrast injection confirmed access.  A 3-French catheter was utilized to maintain access to the collecting system due to the patient's spontaneous movement on the fluoroscopy table and inability to cooperate with the procedure.  A small amount of air was injected into the collecting system to help delineate a posterior calyx.  A posterior calyx was targeted with a 22 gauge Chiba needle.  Access to the calyx was confirmed with advancement of a Nitrex wire into the collecting system.  An Accustick set was utilized to dilate the tract. Over a Benson wire, the tract was serially dilated to 10- [place].  Initially a 10-French percutaneous nephrostomy tube was advanced to the superior aspect of the left ureter, however given the small size of the renal pelvis, the nephrostomy was unable to be coiled.  As such, this catheter was exchanged for a  10-French Renee Pain catheter which was ultimately coiled within the superior aspect of the collecting system. Postprocedural spot radiographs were obtained in various obliquities and the catheter was sutured to the skin.  The catheter was capped and a dressing was placed.  The patient tolerated the procedure well without immediate postprocedural complication.  Findings:  Pre procedural spot radiographic images demonstrates an approximately 1.1 x 5.5 cm stone overlying the expected location of the left UPJ, as  demonstrated on preprocedural abdominal CT. Several additional smaller stones are identified within the left kidney.  Ultrasound scanning was negative for significant hydronephrosis and as such, the UPJ stone was targeted fluoroscopically allowing access to the collecting system.  With the collecting system now opacified, a posterior calyx was targeted fluoroscopically ultimately allowing placement of a 10-French Renee Pain catheter coiled and locked within the superior aspect of the left collecting system.  IMPRESSION:  Successful fluoroscopic guided placement of a left sided 10-French Renee Pain catheter coiled within the superior aspect of the left collecting system.  Original Report Authenticated By: Waynard Reeds, M.D.    Medications: I have reviewed the patient's current medications. Scheduled Meds:   . Chlorhexidine Gluconate Cloth  6 each Topical Q0600  . cloNIDine  0.1 mg Transdermal Weekly  . enoxaparin  40 mg Subcutaneous Q24H  . lactulose  30 g Oral Daily  . vancomycin  500 mg Oral Q6H   And  . metronidazole  500 mg Intravenous Q8H  . mupirocin ointment  1 application Nasal BID  . PHENObarbital  160 mg Oral Daily  . phenytoin  200 mg Oral BID  . piperacillin-tazobactam (ZOSYN)  IV  3.375 g Intravenous Q8H  . vancomycin  750 mg Intravenous Q12H   Continuous Infusions:   . 0.45 % NaCl with KCl 20 mEq / L 125 mL/hr at 01/13/12 1918   PRN Meds:.albuterol, fentaNYL,  iohexol, ipratropium, midazolam  Assessment/Plan: Healthcare-associated pneumonia / Leukocytosis / Sepsis / C. difficile colitis. The patient was admitted and blood cultures show no growth to date.  Patient has multiple infectious sources including his urinary tract, pneumonia and clostridium difficile colitis.  Levofloxacin on February 18 of 2013, discontinued. Empiric vancomycin and Zosyn since 01/12/2012.  Discontinue vancomycin IV today.  Patient is on IV metronidazole and oral vancomycin since 01/13/2012.  Oxygen saturations are stable.  Leukocytosis improving. Speech therapy evaluation done is recommending dysphagia 3 diet with thin liquids with full supervision.    Epilepsy  The patient is being maintained on his home doses of dilantin and phenobarbital. No seizure events noted in the hospital.   Mental retardation  The patient understands "yes or no" but no other communication. He does not talk and stopped ambulating ~ 6 yrs ago. He does not sit up on his own. He usually has a good appetite and eats regular food.   ARF (acute renal failure) / Nephrolithiasis /Hydronephrosis / Hypernatremia. Status post urological consultation.  A percutaneous nephrostomy tube done on 01/13/2012. Will need outpatient lithotripsy when stable.  Hypernatremia likely due to acute renal failure continue to monitor carefully.  Currently the patient is on 0.45% normal saline solution.  Clostridium difficile colitis  Known history of ulcerative colitis. No evidence of obstruction or ileus. Stool for clostridium difficile by PCR noted to be positive. Started on IV Flagyl and oral Vancomycin.   Hyperglycemia  Hemoglobin A1c is normal at 5.5%.  Prophylaxis Lovenox  Disposition Pending.   LOS: 3 days  Chayanne Speir A, MD 01/14/2012, 4:12 PM

## 2012-01-14 NOTE — Progress Notes (Signed)
Speech Pathology: Dysphagia Treatment Note  Patient was observed with : icecream x1 bolus and Thin liquids- liquid medication via syringe.  Patient was noted to have s/s of aspiration : yes- mild cough x1 of approximately 10 boluses  Lung Sounds:  CTA Temperature: afebrile  Patient required:   Pt required max assist to accept medications from RN.  SLP held head flexed upward while RN provided medicine via syringe.  Delayed cough x1 only of approximately 9 medicine boluses.  Delayed swallow observed with lingual thrusting consistent with patient's with MR.    Pt with clear lungs and is afebrile.  Intake of breakfast documented as 10%, noted eggs retained in lateral channels of mouth.  Advised RN to set up suction for use - Pt will often have tonic bite and is difficult to clear mouth or get him to accept po intake.  Pt's father and another caregiver feed pt at his SNF- using strategies to get po into pt, normally intake is good per interview of father 01-13-12.   Clinical Impression: Continued dysphagia due to MR but pt manages at baseline on regular/thin diet.  Aspiration risk will be chronic and likely to progress as pt ages and has other medical issues with aspiration pna risk due to relying on others for feeding, decr respiratory support-weak cough, and lack of mobility.    Recommendations:  Continue diet of Dys3/Thin with full supervision, offering po WHEN pt accepts.  Ideally, family feeding pt maximizes efficiency and safety, since he is familiar with them.  Suspect pt's intake will be poor-maybe only a small amount of liquids, but hopeful to improve with medical improvement.     Pain:   none   Pt non verbal but no clinical signs of pain Intervention Required:   No  Goals: All Goals Met  No family present, but they manage this pt's dysphagia.  No further SLP warranted.  Thanks  [name], MS [facility] SLP [phone]

## 2012-01-14 NOTE — Progress Notes (Signed)
Patient ID: Charles Humphrey, male   DOB: Dec 06, 1955, 56 y.o.   MRN: [id number]    Subjective: Charles Humphrey had successful placement of a left nephrostomy tube yesterday for his obstructing left UPJ stone.   ROS: Unable to obtain  Objective: Vital signs in last 24 hours: Temp:  [98.5 F (36.9 C)-98.9 F (37.2 C)] 98.6 F (37 C) (02/19 2242) Pulse Rate:  [105-113] 110  (02/19 2242) Resp:  [16-34] 20  (02/19 2242) BP: (116-133)/(68-83) 127/83 mmHg (02/19 2242) SpO2:  [75 %-97 %] 91 % (02/19 2242)  Intake/Output from previous day: 02/19 0701 - 02/20 0700 In: -  Out: 1525 [Urine:1325] Intake/Output this shift: Total I/O In: -  Out: 525 [Urine:325; Other:200]  PE:  Pt appears to be in no distress but is non-communicative.  Lab Results:   Basename 01/13/12 0525 01/12/12 0215  WBC 21.2* 27.5*  HGB 12.5* 13.8  HCT 37.3* 42.5  PLT 205 190   BMET  Basename 01/13/12 0525 01/12/12 0215  NA 153* 141  K 3.4* 3.7  CL 117* 104  CO2 21 25  GLUCOSE 112* 225*  BUN 45* 46*  CREATININE 1.34 1.66*  CALCIUM 8.8 8.7   PT/INR No results found for this basename: LABPROT:2,INR:2 in the last 72 hours ABG No results found for this basename: PHART:2,PCO2:2,PO2:2,HCO3:2 in the last 72 hours  Studies/Results: Ir Perc Nephrostomy Left  01/13/2012  *RADIOLOGY REPORT*  Indication:  Obstructing left sided UPJ stone with concern for infected collecting system.  1.  FLUOROSCOPIC GUIDANCE FOR PUNCTURE OF THE LEFT RENAL COLLECTING SYSTEM. 2. LEFT SIDED PERCUTANEOUS NEPHROSTOMY TUBE PLACEMENT  Comparison: CT of the abdomen and pelvis - 01/12/2012; 03/18/2010  Intravenous medications: Fentanyl 200 mcg IV; Versed 4 mg IV; The patient is currently admitted to the hospital and on intravenous antibiotics. The antibiotic was administered in an appropriate time frame prior to skin puncture.  Total Moderate Sedation Time: 65 minutes.  Contrast: 60 mL Omnipaque-300 (administered into the collecting system)   Fluoroscopy Time: 17.1 minutes.  Complications: None immediate  Procedure:  Informed written consent was obtained from the patient's family after a discussion of the risks, benefits, and alternatives to treatment.  The left flank region was prepped with Betadine in a sterile fashion, and a sterile drape was applied covering the operative field.  A sterile gown and sterile gloves were used for the procedure.  A timeout was performed prior to the initiation of the procedure.  A pre procedural spot fluoroscopic image was obtained of the upper abdomen.  Ultrasound scanning performed of the kidney was negative for significant hydronephrosis.  As such, the stone within left UPJ was targeted fluoroscopically with a 22 gauge Chiba needle.  Access to the collecting system was confirmed with advancement of a Nitrex wire into the collecting system.  The needle was exchanged for the inner 3 French catheter from an Accustick set and contrast injection confirmed access.  A 3-French catheter was utilized to maintain access to the collecting system due to the patient's spontaneous movement on the fluoroscopy table and inability to cooperate with the procedure.  A small amount of air was injected into the collecting system to help delineate a posterior calyx.  A posterior calyx was targeted with a 22 gauge Chiba needle.  Access to the calyx was confirmed with advancement of a Nitrex wire into the collecting system.  An Accustick set was utilized to dilate the tract. Over a Benson wire, the tract was serially dilated to 10- [place].  Initially a 10-French percutaneous nephrostomy tube was advanced to the superior aspect of the left ureter, however given the small size of the renal pelvis, the nephrostomy was unable to be coiled.  As such, this catheter was exchanged for a 10-French Renee Pain catheter which was ultimately coiled within the superior aspect of the collecting system. Postprocedural spot radiographs were obtained in  various obliquities and the catheter was sutured to the skin.  The catheter was capped and a dressing was placed.  The patient tolerated the procedure well without immediate postprocedural complication.  Findings:  Pre procedural spot radiographic images demonstrates an approximately 1.1 x 5.5 cm stone overlying the expected location of the left UPJ, as demonstrated on preprocedural abdominal CT. Several additional smaller stones are identified within the left kidney.  Ultrasound scanning was negative for significant hydronephrosis and as such, the UPJ stone was targeted fluoroscopically allowing access to the collecting system.  With the collecting system now opacified, a posterior calyx was targeted fluoroscopically ultimately allowing placement of a 10-French Renee Pain catheter coiled and locked within the superior aspect of the left collecting system.  IMPRESSION:  Successful fluoroscopic guided placement of a left sided 10-French Renee Pain catheter coiled within the superior aspect of the left collecting system.  Original Report Authenticated By: Waynard Reeds, M.D.   Ir US Guide Bx Asp/drain  01/13/2012  *RADIOLOGY REPORT*  Indication:  Obstructing left sided UPJ stone with concern for infected collecting system.  1.  FLUOROSCOPIC GUIDANCE FOR PUNCTURE OF THE LEFT RENAL COLLECTING SYSTEM. 2. LEFT SIDED PERCUTANEOUS NEPHROSTOMY TUBE PLACEMENT  Comparison: CT of the abdomen and pelvis - 01/12/2012; 03/18/2010  Intravenous medications: Fentanyl 200 mcg IV; Versed 4 mg IV; The patient is currently admitted to the hospital and on intravenous antibiotics. The antibiotic was administered in an appropriate time frame prior to skin puncture.  Total Moderate Sedation Time: 65 minutes.  Contrast: 60 mL Omnipaque-300 (administered into the collecting system)  Fluoroscopy Time: 17.1 minutes.  Complications: None immediate  Procedure:  Informed written consent was obtained from the patient's family after a  discussion of the risks, benefits, and alternatives to treatment.  The left flank region was prepped with Betadine in a sterile fashion, and a sterile drape was applied covering the operative field.  A sterile gown and sterile gloves were used for the procedure.  A timeout was performed prior to the initiation of the procedure.  A pre procedural spot fluoroscopic image was obtained of the upper abdomen.  Ultrasound scanning performed of the kidney was negative for significant hydronephrosis.  As such, the stone within left UPJ was targeted fluoroscopically with a 22 gauge Chiba needle.  Access to the collecting system was confirmed with advancement of a Nitrex wire into the collecting system.  The needle was exchanged for the inner 3 French catheter from an Accustick set and contrast injection confirmed access.  A 3-French catheter was utilized to maintain access to the collecting system due to the patient's spontaneous movement on the fluoroscopy table and inability to cooperate with the procedure.  A small amount of air was injected into the collecting system to help delineate a posterior calyx.  A posterior calyx was targeted with a 22 gauge Chiba needle.  Access to the calyx was confirmed with advancement of a Nitrex wire into the collecting system.  An Accustick set was utilized to dilate the tract. Over a Benson wire, the tract was serially dilated to 10- [place].  Initially a 10-French  percutaneous nephrostomy tube was advanced to the superior aspect of the left ureter, however given the small size of the renal pelvis, the nephrostomy was unable to be coiled.  As such, this catheter was exchanged for a 10-French Renee Pain catheter which was ultimately coiled within the superior aspect of the collecting system. Postprocedural spot radiographs were obtained in various obliquities and the catheter was sutured to the skin.  The catheter was capped and a dressing was placed.  The patient tolerated the  procedure well without immediate postprocedural complication.  Findings:  Pre procedural spot radiographic images demonstrates an approximately 1.1 x 5.5 cm stone overlying the expected location of the left UPJ, as demonstrated on preprocedural abdominal CT. Several additional smaller stones are identified within the left kidney.  Ultrasound scanning was negative for significant hydronephrosis and as such, the UPJ stone was targeted fluoroscopically allowing access to the collecting system.  With the collecting system now opacified, a posterior calyx was targeted fluoroscopically ultimately allowing placement of a 10-French Renee Pain catheter coiled and locked within the superior aspect of the left collecting system.  IMPRESSION:  Successful fluoroscopic guided placement of a left sided 10-French Renee Pain catheter coiled within the superior aspect of the left collecting system.  Original Report Authenticated By: Waynard Reeds, M.D.    Anti-infectives: Anti-infectives     Start     Dose/Rate Route Frequency Ordered Stop   01/13/12 1800   vancomycin (VANCOCIN) 50 mg/mL oral solution 500 mg        500 mg Oral 4 times per day 01/13/12 1455 01/27/12 1759   01/13/12 1500   metroNIDAZOLE (FLAGYL) IVPB 500 mg        500 mg 100 mL/hr over 60 Minutes Intravenous Every 8 hours 01/13/12 1455 01/27/12 1459   01/13/12 1300   metroNIDAZOLE (FLAGYL) IVPB 500 mg  Status:  Discontinued        500 mg 100 mL/hr over 60 Minutes Intravenous Every 8 hours 01/13/12 1217 01/13/12 1455   01/13/12 0500   Levofloxacin (LEVAQUIN) IVPB 750 mg  Status:  Discontinued        750 mg 100 mL/hr over 90 Minutes Intravenous Every 24 hours 01/12/12 0450 01/12/12 0600   01/12/12 1200   vancomycin (VANCOCIN) 750 mg in sodium chloride 0.9 % 150 mL IVPB        750 mg 150 mL/hr over 60 Minutes Intravenous Every 12 hours 01/12/12 0450     01/12/12 0800  piperacillin-tazobactam (ZOSYN) IVPB 3.375 g       3.375 g 12.5  mL/hr over 240 Minutes Intravenous Every 8 hours 01/12/12 0450     01/12/12 0400   Levofloxacin (LEVAQUIN) IVPB 750 mg        750 mg 100 mL/hr over 90 Minutes Intravenous  Once 01/12/12 0334 01/12/12 0552   01/12/12 0015   vancomycin (VANCOCIN) IVPB 1000 mg/200 mL premix        1,000 mg 200 mL/hr over 60 Minutes Intravenous  Once 01/11/12 2350 01/12/12 0107   01/12/12 0015  piperacillin-tazobactam (ZOSYN) IVPB 3.375 g       3.375 g 12.5 mL/hr over 240 Minutes Intravenous  Once 01/11/12 2350 01/12/12 0407          Assessment: Left UPJ stone with obstruction and sepsis.  Now s/p left nephrostomy tube placement. Right nephrolithiasis.  Plan: He will need to have a left percutaneous nephrolithotomy when recovered from his sepsis.  It might be 2-4 weeks before this  can be scheduled.  At some point he will need treatment of the right renal stones.   LOS: 3 days    Anner Crete 01/14/2012

## 2012-01-14 NOTE — Progress Notes (Signed)
Subjective: Awake, does not appear distressed,.  Objective: Vital signs in last 24 hours: Temp:  [98.4 F (36.9 C)-98.9 F (37.2 C)] 98.4 F (36.9 C) (02/20 0547) Pulse Rate:  [105-113] 111  (02/20 0547) Resp:  [16-34] 21  (02/20 0547) BP: (116-133)/(68-83) 130/80 mmHg (02/20 0547) SpO2:  [75 %-97 %] 93 % (02/20 0547) Last BM Date: 01/13/12  Intake/Output from previous day: 02/19 0701 - 02/20 0700 In: -  Out: 2025 [Urine:1525] Intake/Output this shift: Total I/O In: 80 [P.O.:80] Out: 300 [Urine:300]   Physical exam : PCN in place with amber colored urine.  Unable to assess insertion site as patient agitated with movement.  Good UOP since placement yesterday.  Urine in foley clear.    Lab Results:   Basename 01/13/12 0525 01/12/12 0215  WBC 21.2* 27.5*  HGB 12.5* 13.8  HCT 37.3* 42.5  PLT 205 190   BMET  Basename 01/14/12 1229 01/13/12 0525 01/12/12 0215  NA -- 153* 141  K -- 3.4* 3.7  CL -- 117* 104  CO2 -- 21 25  GLUCOSE -- 112* 225*  BUN -- 45* 46*  CREATININE 0.91 1.34 --  CALCIUM -- 8.8 8.7   PT/INR No results found for this basename: LABPROT:2,INR:2 in the last 72 hours ABG No results found for this basename: PHART:2,PCO2:2,PO2:2,HCO3:2 in the last 72 hours  Studies/Results: Ir Perc Nephrostomy Left  01/13/2012  *RADIOLOGY REPORT*  Indication:  Obstructing left sided UPJ stone with concern for infected collecting system.  1.  FLUOROSCOPIC GUIDANCE FOR PUNCTURE OF THE LEFT RENAL COLLECTING SYSTEM. 2. LEFT SIDED PERCUTANEOUS NEPHROSTOMY TUBE PLACEMENT  Comparison: CT of the abdomen and pelvis - 01/12/2012; 03/18/2010  Intravenous medications: Fentanyl 200 mcg IV; Versed 4 mg IV; The patient is currently admitted to the hospital and on intravenous antibiotics. The antibiotic was administered in an appropriate time frame prior to skin puncture.  Total Moderate Sedation Time: 65 minutes.  Contrast: 60 mL Omnipaque-300 (administered into the collecting  system)  Fluoroscopy Time: 17.1 minutes.  Complications: None immediate  Procedure:  Informed written consent was obtained from the patient's family after a discussion of the risks, benefits, and alternatives to treatment.  The left flank region was prepped with Betadine in a sterile fashion, and a sterile drape was applied covering the operative field.  A sterile gown and sterile gloves were used for the procedure.  A timeout was performed prior to the initiation of the procedure.  A pre procedural spot fluoroscopic image was obtained of the upper abdomen.  Ultrasound scanning performed of the kidney was negative for significant hydronephrosis.  As such, the stone within left UPJ was targeted fluoroscopically with a 22 gauge Chiba needle.  Access to the collecting system was confirmed with advancement of a Nitrex wire into the collecting system.  The needle was exchanged for the inner 3 French catheter from an Accustick set and contrast injection confirmed access.  A 3-French catheter was utilized to maintain access to the collecting system due to the patient's spontaneous movement on the fluoroscopy table and inability to cooperate with the procedure.  A small amount of air was injected into the collecting system to help delineate a posterior calyx.  A posterior calyx was targeted with a 22 gauge Chiba needle.  Access to the calyx was confirmed with advancement of a Nitrex wire into the collecting system.  An Accustick set was utilized to dilate the tract. Over a Benson wire, the tract was serially dilated to 10- [place].  Initially a 10-French percutaneous nephrostomy tube was advanced to the superior aspect of the left ureter, however given the small size of the renal pelvis, the nephrostomy was unable to be coiled.  As such, this catheter was exchanged for a 10-French Renee Pain catheter which was ultimately coiled within the superior aspect of the collecting system. Postprocedural spot radiographs were  obtained in various obliquities and the catheter was sutured to the skin.  The catheter was capped and a dressing was placed.  The patient tolerated the procedure well without immediate postprocedural complication.  Findings:  Pre procedural spot radiographic images demonstrates an approximately 1.1 x 5.5 cm stone overlying the expected location of the left UPJ, as demonstrated on preprocedural abdominal CT. Several additional smaller stones are identified within the left kidney.  Ultrasound scanning was negative for significant hydronephrosis and as such, the UPJ stone was targeted fluoroscopically allowing access to the collecting system.  With the collecting system now opacified, a posterior calyx was targeted fluoroscopically ultimately allowing placement of a 10-French Renee Pain catheter coiled and locked within the superior aspect of the left collecting system.  IMPRESSION:  Successful fluoroscopic guided placement of a left sided 10-French Renee Pain catheter coiled within the superior aspect of the left collecting system.  Original Report Authenticated By: Waynard Reeds, M.D.   Ir US Guide Bx Asp/drain  01/13/2012  *RADIOLOGY REPORT*  Indication:  Obstructing left sided UPJ stone with concern for infected collecting system.  1.  FLUOROSCOPIC GUIDANCE FOR PUNCTURE OF THE LEFT RENAL COLLECTING SYSTEM. 2. LEFT SIDED PERCUTANEOUS NEPHROSTOMY TUBE PLACEMENT  Comparison: CT of the abdomen and pelvis - 01/12/2012; 03/18/2010  Intravenous medications: Fentanyl 200 mcg IV; Versed 4 mg IV; The patient is currently admitted to the hospital and on intravenous antibiotics. The antibiotic was administered in an appropriate time frame prior to skin puncture.  Total Moderate Sedation Time: 65 minutes.  Contrast: 60 mL Omnipaque-300 (administered into the collecting system)  Fluoroscopy Time: 17.1 minutes.  Complications: None immediate  Procedure:  Informed written consent was obtained from the patient's  family after a discussion of the risks, benefits, and alternatives to treatment.  The left flank region was prepped with Betadine in a sterile fashion, and a sterile drape was applied covering the operative field.  A sterile gown and sterile gloves were used for the procedure.  A timeout was performed prior to the initiation of the procedure.  A pre procedural spot fluoroscopic image was obtained of the upper abdomen.  Ultrasound scanning performed of the kidney was negative for significant hydronephrosis.  As such, the stone within left UPJ was targeted fluoroscopically with a 22 gauge Chiba needle.  Access to the collecting system was confirmed with advancement of a Nitrex wire into the collecting system.  The needle was exchanged for the inner 3 French catheter from an Accustick set and contrast injection confirmed access.  A 3-French catheter was utilized to maintain access to the collecting system due to the patient's spontaneous movement on the fluoroscopy table and inability to cooperate with the procedure.  A small amount of air was injected into the collecting system to help delineate a posterior calyx.  A posterior calyx was targeted with a 22 gauge Chiba needle.  Access to the calyx was confirmed with advancement of a Nitrex wire into the collecting system.  An Accustick set was utilized to dilate the tract. Over a Benson wire, the tract was serially dilated to 10- [place].  Initially a 10-French  percutaneous nephrostomy tube was advanced to the superior aspect of the left ureter, however given the small size of the renal pelvis, the nephrostomy was unable to be coiled.  As such, this catheter was exchanged for a 10-French Renee Pain catheter which was ultimately coiled within the superior aspect of the collecting system. Postprocedural spot radiographs were obtained in various obliquities and the catheter was sutured to the skin.  The catheter was capped and a dressing was placed.  The patient  tolerated the procedure well without immediate postprocedural complication.  Findings:  Pre procedural spot radiographic images demonstrates an approximately 1.1 x 5.5 cm stone overlying the expected location of the left UPJ, as demonstrated on preprocedural abdominal CT. Several additional smaller stones are identified within the left kidney.  Ultrasound scanning was negative for significant hydronephrosis and as such, the UPJ stone was targeted fluoroscopically allowing access to the collecting system.  With the collecting system now opacified, a posterior calyx was targeted fluoroscopically ultimately allowing placement of a 10-French Renee Pain catheter coiled and locked within the superior aspect of the left collecting system.  IMPRESSION:  Successful fluoroscopic guided placement of a left sided 10-French Renee Pain catheter coiled within the superior aspect of the left collecting system.  Original Report Authenticated By: Waynard Reeds, M.D.    Anti-infectives: Anti-infectives     Start     Dose/Rate Route Frequency Ordered Stop   01/13/12 1800   vancomycin (VANCOCIN) 50 mg/mL oral solution 500 mg        500 mg Oral 4 times per day 01/13/12 1455 01/27/12 1759   01/13/12 1500   metroNIDAZOLE (FLAGYL) IVPB 500 mg        500 mg 100 mL/hr over 60 Minutes Intravenous Every 8 hours 01/13/12 1455 01/27/12 1459   01/13/12 1300   metroNIDAZOLE (FLAGYL) IVPB 500 mg  Status:  Discontinued        500 mg 100 mL/hr over 60 Minutes Intravenous Every 8 hours 01/13/12 1217 01/13/12 1455   01/13/12 0500   Levofloxacin (LEVAQUIN) IVPB 750 mg  Status:  Discontinued        750 mg 100 mL/hr over 90 Minutes Intravenous Every 24 hours 01/12/12 0450 01/12/12 0600   01/12/12 1200   vancomycin (VANCOCIN) 750 mg in sodium chloride 0.9 % 150 mL IVPB        750 mg 150 mL/hr over 60 Minutes Intravenous Every 12 hours 01/12/12 0450     01/12/12 0800   piperacillin-tazobactam (ZOSYN) IVPB 3.375 g         3.375 g 12.5 mL/hr over 240 Minutes Intravenous Every 8 hours 01/12/12 0450     01/12/12 0400   Levofloxacin (LEVAQUIN) IVPB 750 mg        750 mg 100 mL/hr over 90 Minutes Intravenous  Once 01/12/12 0334 01/12/12 0552   01/12/12 0015   vancomycin (VANCOCIN) IVPB 1000 mg/200 mL premix        1,000 mg 200 mL/hr over 60 Minutes Intravenous  Once 01/11/12 2350 01/12/12 0107   01/12/12 0015   piperacillin-tazobactam (ZOSYN) IVPB 3.375 g        3.375 g 12.5 mL/hr over 240 Minutes Intravenous  Once 01/11/12 2350 01/12/12 0407          Assessment/Plan: s/p left PCN for renal stone causing obstruction - amber colored urine.  Appears to be draining well. WBC declining.  Will cont. To follow.   LOS: 3 days    Javonna Balli D 01/14/2012

## 2012-01-14 NOTE — Progress Notes (Signed)
Patient is from [facility] in [place]. CSW called patients father, Charles Humphrey. He confirms that patient is a resident of [facility] and they do want patient to return there upon discharge.  Charles Humphrey C. Charles Humphrey MSW, LCSW [phone]

## 2012-01-14 NOTE — Progress Notes (Signed)
ANTIBIOTIC CONSULT NOTE - FOLLOW UP  Pharmacy Consult for Vancomycin Indication: HCAP/Sepsis  No Known Allergies  Patient Measurements: Height: 5\' 5"  (165.1 cm) Weight: 200 lb (90.719 kg) IBW/kg (Calculated) : 61.5    Vital Signs: Temp: 98.4 F (36.9 C) (02/20 0547) Temp src: Axillary (02/20 0547) BP: 130/80 mmHg (02/20 0547) Pulse Rate: 111  (02/20 0547) Intake/Output from previous day: 02/19 0701 - 02/20 0700 In: -  Out: 2025 [Urine:1525] Intake/Output from this shift: Total I/O In: 80 [P.O.:80] Out: 300 [Urine:300]  Labs:  Basename 01/14/12 1229 01/13/12 0525 01/12/12 0215  WBC -- 21.2* 27.5*  HGB -- 12.5* 13.8  PLT -- 205 190  LABCREA -- -- --  CREATININE 0.91 1.34 1.66*   Estimated Creatinine Clearance: 97.2 ml/min (by C-G formula based on Cr of 0.91).  Basename 01/14/12 1229  VANCOTROUGH 20.1*  VANCOPEAK --  Drue Dun --  GENTTROUGH --  GENTPEAK --  GENTRANDOM --  TOBRATROUGH --  TOBRAPEAK --  TOBRARND --  AMIKACINPEAK --  AMIKACINTROU --  AMIKACIN --     Microbiology: Recent Results (from the past 720 hour(s))  CULTURE, BLOOD (ROUTINE X 2)     Status: Normal (Preliminary result)   Collection Time   01/12/12 12:00 AM      Component Value Range Status Comment   Specimen Description BLOOD LEFT HAND   Final    Special Requests BOTTLES DRAWN AEROBIC ONLY 2 CC   Final    Culture  Setup Time [id number]   Final    Culture     Final    Value:        BLOOD CULTURE RECEIVED NO GROWTH TO DATE CULTURE WILL BE HELD FOR 5 DAYS BEFORE ISSUING A FINAL NEGATIVE REPORT   Report Status PENDING   Incomplete   CULTURE, BLOOD (ROUTINE X 2)     Status: Normal (Preliminary result)   Collection Time   01/12/12  2:15 AM      Component Value Range Status Comment   Specimen Description BLOOD LEFT FINGER   Final    Special Requests BOTTLES DRAWN AEROBIC ONLY 5 CC   Final    Culture  Setup Time [id number]   Final    Culture     Final    Value:        BLOOD  CULTURE RECEIVED NO GROWTH TO DATE CULTURE WILL BE HELD FOR 5 DAYS BEFORE ISSUING A FINAL NEGATIVE REPORT   Report Status PENDING   Incomplete   MRSA PCR SCREENING     Status: Abnormal   Collection Time   01/12/12  4:03 AM      Component Value Range Status Comment   MRSA by PCR POSITIVE (*) NEGATIVE  Final   CLOSTRIDIUM DIFFICILE BY PCR     Status: Abnormal   Collection Time   01/13/12  7:50 AM      Component Value Range Status Comment   C difficile by pcr POSITIVE (*) NEGATIVE  Final     Anti-infectives     Start     Dose/Rate Route Frequency Ordered Stop   01/13/12 1800   vancomycin (VANCOCIN) 50 mg/mL oral solution 500 mg        500 mg Oral 4 times per day 01/13/12 1455 01/27/12 1759   01/13/12 1500   metroNIDAZOLE (FLAGYL) IVPB 500 mg        500 mg 100 mL/hr over 60 Minutes Intravenous Every 8 hours 01/13/12 1455 01/27/12 1459   01/13/12 1300  metroNIDAZOLE (FLAGYL) IVPB 500 mg  Status:  Discontinued        500 mg 100 mL/hr over 60 Minutes Intravenous Every 8 hours 01/13/12 1217 01/13/12 1455   01/13/12 0500   Levofloxacin (LEVAQUIN) IVPB 750 mg  Status:  Discontinued        750 mg 100 mL/hr over 90 Minutes Intravenous Every 24 hours 01/12/12 0450 01/12/12 0600   01/12/12 1200   vancomycin (VANCOCIN) 750 mg in sodium chloride 0.9 % 150 mL IVPB        750 mg 150 mL/hr over 60 Minutes Intravenous Every 12 hours 01/12/12 0450     01/12/12 0800  piperacillin-tazobactam (ZOSYN) IVPB 3.375 g       3.375 g 12.5 mL/hr over 240 Minutes Intravenous Every 8 hours 01/12/12 0450     01/12/12 0400   Levofloxacin (LEVAQUIN) IVPB 750 mg        750 mg 100 mL/hr over 90 Minutes Intravenous  Once 01/12/12 0334 01/12/12 0552   01/12/12 0015   vancomycin (VANCOCIN) IVPB 1000 mg/200 mL premix        1,000 mg 200 mL/hr over 60 Minutes Intravenous  Once 01/11/12 2350 01/12/12 0107   01/12/12 0015  piperacillin-tazobactam (ZOSYN) IVPB 3.375 g       3.375 g 12.5 mL/hr over 240 Minutes  Intravenous  Once 01/11/12 2350 01/12/12 0407          Assessment: 56 yo M on Day # 3 Vancomycin 750mg  IV q12h (after 1g in ER 2/17) and Zosyn 3.375g IV q8h (extended infusion) for HCAP/Sepsis.  Found to have bilateral nephrolithiasis, is s/p L PCN (01/13/2012). Plan for L perc lithotomy 2-4 weeks. Scr has improved 0.91 <-- 1.79, CrCl 97 ml/min (95 ml/min/1.63m2) Vancomycin trough is @ high end of goal and expect this level will decrease given improved Scr  Goal of Therapy:  Vancomycin trough level 15-20 mcg/ml  Plan:  No change to vancomycin or zosyn doses.  Continue to monitor and adjust as necessary Length of therapy per MD Recheck trough if indicated based on LOT and renal fx  Gwen Her PharmD  [phone] 01/14/2012 1:23 PM

## 2012-01-15 LAB — BASIC METABOLIC PANEL
BUN: 23 mg/dL (ref 6–23)
Calcium: 8.5 mg/dL (ref 8.4–10.5)
Calcium: 8.2 mg/dL — ABNORMAL LOW (ref 8.4–10.5)
Creatinine, Ser: 0.82 mg/dL (ref 0.50–1.35)
Creatinine, Ser: 0.73 mg/dL (ref 0.50–1.35)
GFR calc Af Amer: >90 mL/min (ref >90)
GFR calc non Af Amer: >90 mL/min (ref >90)
GFR calc non Af Amer: >90 mL/min (ref >90)
Sodium: 165 mEq/L (ref 135–145)

## 2012-01-15 LAB — CBC
MCH: 31.1 pg (ref 26.0–34.0)
MCHC: 33.7 g/dL (ref 30.0–36.0)
MCV: 92.2 fL (ref 78.0–100.0)
Platelets: 280 10*3/uL (ref 150–400)
RDW: 16.2 % — ABNORMAL HIGH (ref 11.5–15.5)
WBC: 6.7 10*3/uL (ref 4.0–10.5)

## 2012-01-15 MED ORDER — POTASSIUM CHLORIDE CRYS ER 20 MEQ PO TBCR
40.0000 meq | EXTENDED_RELEASE_TABLET | Freq: Once | ORAL | Status: AC
  Administered 2012-01-15: 40 meq via ORAL
  Filled 2012-01-15: qty 2

## 2012-01-15 NOTE — Progress Notes (Signed)
Report from [place], [place]. Pt seen, asleep, awakens briefly while turning and falls immediately back to sleep. IV infusing w/o difficulty. Kerlex pulled back and site looks good. Lt Neph tube site c/d/i with cloudy urine draining. Foley w/ amber urine draining. Pt turned to left side. Resp even, unlabored, no apparent distress. Appears comfortable.

## 2012-01-15 NOTE — Progress Notes (Addendum)
Critical lab value of Na+ 164, which is unchanged from previous result. D5 was started at 2115 last night per MD's order. No change in Pt.'s physical or mental assessment noted. Night coverage informed.

## 2012-01-15 NOTE — Progress Notes (Signed)
   CARE MANAGEMENT NOTE 01/15/2012  Patient:  MIN, COLLYMORE   Account Number:  [id number]  Date Initiated:  01/12/2012  Documentation initiated by:  Jiles Crocker  Subjective/Objective Assessment:   ADMITTED WITH FEVER, HYPOXIA     Action/Plan:   PATIENT IS A RESIDENT OF A NURSING FACILITY; SOCIAL WORKER REFERRAL PLACED   Anticipated DC Date:  01/19/2012   Anticipated DC Plan:  SKILLED NURSING FACILITY  In-house referral  Clinical Social Worker         Choice offered to / List presented to:             Status of service:  In process, will continue to follow Medicare Important Message given?   (If response is "NO", the following Medicare IM given date fields will be blank) Date Medicare IM given:   Date Additional Medicare IM given:    Discharge Disposition:    Per UR Regulation:  Reviewed for med. necessity/level of care/duration of stay  Comments:  01/15/12 Jacky Hartung RN,BSN NCM [phone] RENAL FOLLOWING-S/P PERC NEPHROSTOMY TUBE.HYPERNATREMIA.D/C PLAN RETURN SNF ONCE MED STABLE.CSW DILIGENTLY FOLLOWING. 01/12/2012 - Sofie Rower RN, BSN, MHA

## 2012-01-15 NOTE — Progress Notes (Signed)
Patient ID: Charles Humphrey, male   DOB: April 28, 1956, 56 y.o.   MRN: [id number]    Subjective: Charles Humphrey is doing well with clear urine draining from the Left NT.  His WBC has normalized and he is afeb. ROS: Pt is non-verbal  Objective: Vital signs in last 24 hours: Temp:  [97.6 F (36.4 C)-98.4 F (36.9 C)] 98.4 F (36.9 C) (02/21 0511) Pulse Rate:  [81-104] 81  (02/21 0511) Resp:  [16-20] 20  (02/21 0511) BP: (116-138)/(76-81) 116/81 mmHg (02/21 0511) SpO2:  [90 %-91 %] 91 % (02/21 0511)  Intake/Output from previous day: 02/20 0701 - 02/21 0700 In: 260 [P.O.:160; IV Piggyback:100] Out: 1475 [Urine:1475] Intake/Output this shift:    General appearance: no distress GI: NT in left flank is draining well.  Lab Results:   Basename 01/15/12 0330 01/13/12 0525  WBC 6.7 21.2*  HGB 12.4* 12.5*  HCT 36.8* 37.3*  PLT 280 205   BMET  Basename 01/15/12 0330 01/14/12 1229  NA 164* 164*  K 3.1* 2.9*  CL 127* 125*  CO2 26 22  GLUCOSE 132* 117*  BUN 23 30*  CREATININE 0.82 0.910.94  CALCIUM 8.5 8.8   PT/INR No results found for this basename: LABPROT:2,INR:2 in the last 72 hours ABG No results found for this basename: PHART:2,PCO2:2,PO2:2,HCO3:2 in the last 72 hours  Studies/Results: Ir Perc Nephrostomy Left  01/13/2012  *RADIOLOGY REPORT*  Indication:  Obstructing left sided UPJ stone with concern for infected collecting system.  1.  FLUOROSCOPIC GUIDANCE FOR PUNCTURE OF THE LEFT RENAL COLLECTING SYSTEM. 2. LEFT SIDED PERCUTANEOUS NEPHROSTOMY TUBE PLACEMENT  Comparison: CT of the abdomen and pelvis - 01/12/2012; 03/18/2010  Intravenous medications: Fentanyl 200 mcg IV; Versed 4 mg IV; The patient is currently admitted to the hospital and on intravenous antibiotics. The antibiotic was administered in an appropriate time frame prior to skin puncture.  Total Moderate Sedation Time: 65 minutes.  Contrast: 60 mL Omnipaque-300 (administered into the collecting system)   Fluoroscopy Time: 17.1 minutes.  Complications: None immediate  Procedure:  Informed written consent was obtained from the patient's family after a discussion of the risks, benefits, and alternatives to treatment.  The left flank region was prepped with Betadine in a sterile fashion, and a sterile drape was applied covering the operative field.  A sterile gown and sterile gloves were used for the procedure.  A timeout was performed prior to the initiation of the procedure.  A pre procedural spot fluoroscopic image was obtained of the upper abdomen.  Ultrasound scanning performed of the kidney was negative for significant hydronephrosis.  As such, the stone within left UPJ was targeted fluoroscopically with a 22 gauge Chiba needle.  Access to the collecting system was confirmed with advancement of a Nitrex wire into the collecting system.  The needle was exchanged for the inner 3 French catheter from an Accustick set and contrast injection confirmed access.  A 3-French catheter was utilized to maintain access to the collecting system due to the patient's spontaneous movement on the fluoroscopy table and inability to cooperate with the procedure.  A small amount of air was injected into the collecting system to help delineate a posterior calyx.  A posterior calyx was targeted with a 22 gauge Chiba needle.  Access to the calyx was confirmed with advancement of a Nitrex wire into the collecting system.  An Accustick set was utilized to dilate the tract. Over a Benson wire, the tract was serially dilated to 10- [place].  Initially a  10-French percutaneous nephrostomy tube was advanced to the superior aspect of the left ureter, however given the small size of the renal pelvis, the nephrostomy was unable to be coiled.  As such, this catheter was exchanged for a 10-French Renee Pain catheter which was ultimately coiled within the superior aspect of the collecting system. Postprocedural spot radiographs were obtained in  various obliquities and the catheter was sutured to the skin.  The catheter was capped and a dressing was placed.  The patient tolerated the procedure well without immediate postprocedural complication.  Findings:  Pre procedural spot radiographic images demonstrates an approximately 1.1 x 5.5 cm stone overlying the expected location of the left UPJ, as demonstrated on preprocedural abdominal CT. Several additional smaller stones are identified within the left kidney.  Ultrasound scanning was negative for significant hydronephrosis and as such, the UPJ stone was targeted fluoroscopically allowing access to the collecting system.  With the collecting system now opacified, a posterior calyx was targeted fluoroscopically ultimately allowing placement of a 10-French Renee Pain catheter coiled and locked within the superior aspect of the left collecting system.  IMPRESSION:  Successful fluoroscopic guided placement of a left sided 10-French Renee Pain catheter coiled within the superior aspect of the left collecting system.  Original Report Authenticated By: Waynard Reeds, M.D.   Ir US Guide Bx Asp/drain  01/13/2012  *RADIOLOGY REPORT*  Indication:  Obstructing left sided UPJ stone with concern for infected collecting system.  1.  FLUOROSCOPIC GUIDANCE FOR PUNCTURE OF THE LEFT RENAL COLLECTING SYSTEM. 2. LEFT SIDED PERCUTANEOUS NEPHROSTOMY TUBE PLACEMENT  Comparison: CT of the abdomen and pelvis - 01/12/2012; 03/18/2010  Intravenous medications: Fentanyl 200 mcg IV; Versed 4 mg IV; The patient is currently admitted to the hospital and on intravenous antibiotics. The antibiotic was administered in an appropriate time frame prior to skin puncture.  Total Moderate Sedation Time: 65 minutes.  Contrast: 60 mL Omnipaque-300 (administered into the collecting system)  Fluoroscopy Time: 17.1 minutes.  Complications: None immediate  Procedure:  Informed written consent was obtained from the patient's family after a  discussion of the risks, benefits, and alternatives to treatment.  The left flank region was prepped with Betadine in a sterile fashion, and a sterile drape was applied covering the operative field.  A sterile gown and sterile gloves were used for the procedure.  A timeout was performed prior to the initiation of the procedure.  A pre procedural spot fluoroscopic image was obtained of the upper abdomen.  Ultrasound scanning performed of the kidney was negative for significant hydronephrosis.  As such, the stone within left UPJ was targeted fluoroscopically with a 22 gauge Chiba needle.  Access to the collecting system was confirmed with advancement of a Nitrex wire into the collecting system.  The needle was exchanged for the inner 3 French catheter from an Accustick set and contrast injection confirmed access.  A 3-French catheter was utilized to maintain access to the collecting system due to the patient's spontaneous movement on the fluoroscopy table and inability to cooperate with the procedure.  A small amount of air was injected into the collecting system to help delineate a posterior calyx.  A posterior calyx was targeted with a 22 gauge Chiba needle.  Access to the calyx was confirmed with advancement of a Nitrex wire into the collecting system.  An Accustick set was utilized to dilate the tract. Over a Benson wire, the tract was serially dilated to 10- [place].  Initially a 10-French percutaneous nephrostomy  tube was advanced to the superior aspect of the left ureter, however given the small size of the renal pelvis, the nephrostomy was unable to be coiled.  As such, this catheter was exchanged for a 10-French Renee Pain catheter which was ultimately coiled within the superior aspect of the collecting system. Postprocedural spot radiographs were obtained in various obliquities and the catheter was sutured to the skin.  The catheter was capped and a dressing was placed.  The patient tolerated the  procedure well without immediate postprocedural complication.  Findings:  Pre procedural spot radiographic images demonstrates an approximately 1.1 x 5.5 cm stone overlying the expected location of the left UPJ, as demonstrated on preprocedural abdominal CT. Several additional smaller stones are identified within the left kidney.  Ultrasound scanning was negative for significant hydronephrosis and as such, the UPJ stone was targeted fluoroscopically allowing access to the collecting system.  With the collecting system now opacified, a posterior calyx was targeted fluoroscopically ultimately allowing placement of a 10-French Renee Pain catheter coiled and locked within the superior aspect of the left collecting system.  IMPRESSION:  Successful fluoroscopic guided placement of a left sided 10-French Renee Pain catheter coiled within the superior aspect of the left collecting system.  Original Report Authenticated By: Waynard Reeds, M.D.    Anti-infectives: Anti-infectives     Start     Dose/Rate Route Frequency Ordered Stop   01/13/12 1800   vancomycin (VANCOCIN) 50 mg/mL oral solution 500 mg        500 mg Oral 4 times per day 01/13/12 1455 01/27/12 1759   01/13/12 1500   metroNIDAZOLE (FLAGYL) IVPB 500 mg        500 mg 100 mL/hr over 60 Minutes Intravenous Every 8 hours 01/13/12 1455 01/27/12 1459   01/13/12 1300   metroNIDAZOLE (FLAGYL) IVPB 500 mg  Status:  Discontinued        500 mg 100 mL/hr over 60 Minutes Intravenous Every 8 hours 01/13/12 1217 01/13/12 1455   01/13/12 0500   Levofloxacin (LEVAQUIN) IVPB 750 mg  Status:  Discontinued        750 mg 100 mL/hr over 90 Minutes Intravenous Every 24 hours 01/12/12 0450 01/12/12 0600   01/12/12 1200   vancomycin (VANCOCIN) 750 mg in sodium chloride 0.9 % 150 mL IVPB  Status:  Discontinued        750 mg 150 mL/hr over 60 Minutes Intravenous Every 12 hours 01/12/12 0450 01/14/12 1625   01/12/12 0800  piperacillin-tazobactam (ZOSYN)  IVPB 3.375 g       3.375 g 12.5 mL/hr over 240 Minutes Intravenous Every 8 hours 01/12/12 0450     01/12/12 0400   Levofloxacin (LEVAQUIN) IVPB 750 mg        750 mg 100 mL/hr over 90 Minutes Intravenous  Once 01/12/12 0334 01/12/12 0552   01/12/12 0015   vancomycin (VANCOCIN) IVPB 1000 mg/200 mL premix        1,000 mg 200 mL/hr over 60 Minutes Intravenous  Once 01/11/12 2350 01/12/12 0107   01/12/12 0015  piperacillin-tazobactam (ZOSYN) IVPB 3.375 g       3.375 g 12.5 mL/hr over 240 Minutes Intravenous  Once 01/11/12 2350 01/12/12 0407          Assessment: LUPJ stone with obstruction and sepsis with clinical improvement post drainage. Right renal pelvic stone without obstruction.  Plan: He will be scheduled for left PCNL, but it will probably not be during this hospitalization. He will need  to be reassessed for management of the right renal pelvic stone after the left stone is removed.   LOS: 4 days    Anner Crete 01/15/2012

## 2012-01-15 NOTE — Progress Notes (Signed)
Subjective: Patient is more calm and rested today.  Appetite still poor.  Objective: Vital signs in last 24 hours: Filed Vitals:   01/14/12 1400 01/14/12 2205 01/15/12 0511 01/15/12 1335  BP: 138/76 127/80 116/81 119/76  Pulse: 102 104 81 108  Temp: 97.6 F (36.4 C) 97.8 F (36.6 C) 98.4 F (36.9 C) 98.6 F (37 C)  TempSrc: Axillary Axillary Axillary Axillary  Resp: 18 16 20 18   Height:      Weight:      SpO2: 91% 90% 91% 94%   Weight change:   Intake/Output Summary (Last 24 hours) at 01/15/12 1649 Last data filed at 01/15/12 1630  Gross per 24 hour  Intake     55 ml  Output   1850 ml  Net  -1795 ml    Physical Exam: General: Awake, No acute distress, nonverbal. HEENT: EOMI. Neck: Supple CV: S1 and S2 Lungs: Clear to ascultation bilaterally Abdomen: Soft, Nontender, Nondistended, +bowel sounds. Ext: Good pulses. Trace edema. Back: Percutaneous drain in place.  Lab Results:  Basename 01/15/12 0330 01/14/12 1229  NA 164* 164*  K 3.1* 2.9*  CL 127* 125*  CO2 26 22  GLUCOSE 132* 117*  BUN 23 30*  CREATININE 0.82 0.910.94  CALCIUM 8.5 8.8  MG -- --  PHOS -- --   No results found for this basename: AST:2,ALT:2,ALKPHOS:2,BILITOT:2,PROT:2,ALBUMIN:2 in the last 72 hours No results found for this basename: LIPASE:2,AMYLASE:2 in the last 72 hours  Basename 01/15/12 0330 01/13/12 0525  WBC 6.7 21.2*  NEUTROABS -- --  HGB 12.4* 12.5*  HCT 36.8* 37.3*  MCV 92.2 93.0  PLT 280 205   No results found for this basename: CKTOTAL:3,CKMB:3,CKMBINDEX:3,TROPONINI:3 in the last 72 hours No components found with this basename: POCBNP:3 No results found for this basename: DDIMER:2 in the last 72 hours  Basename 01/13/12 0525  HGBA1C 5.5   No results found for this basename: CHOL:2,HDL:2,LDLCALC:2,TRIG:2,CHOLHDL:2,LDLDIRECT:2 in the last 72 hours No results found for this basename: TSH,T4TOTAL,FREET3,T3FREE,THYROIDAB in the last 72 hours No results found for this  basename: VITAMINB12:2,FOLATE:2,FERRITIN:2,TIBC:2,IRON:2,RETICCTPCT:2 in the last 72 hours  Micro Results: Recent Results (from the past 240 hour(s))  CULTURE, BLOOD (ROUTINE X 2)     Status: Normal (Preliminary result)   Collection Time   01/12/12 12:00 AM      Component Value Range Status Comment   Specimen Description BLOOD LEFT HAND   Final    Special Requests BOTTLES DRAWN AEROBIC ONLY 2 CC   Final    Culture  Setup Time [id number]   Final    Culture     Final    Value:        BLOOD CULTURE RECEIVED NO GROWTH TO DATE CULTURE WILL BE HELD FOR 5 DAYS BEFORE ISSUING A FINAL NEGATIVE REPORT   Report Status PENDING   Incomplete   CULTURE, BLOOD (ROUTINE X 2)     Status: Normal (Preliminary result)   Collection Time   01/12/12  2:15 AM      Component Value Range Status Comment   Specimen Description BLOOD LEFT FINGER   Final    Special Requests BOTTLES DRAWN AEROBIC ONLY 5 CC   Final    Culture  Setup Time [id number]   Final    Culture     Final    Value:        BLOOD CULTURE RECEIVED NO GROWTH TO DATE CULTURE WILL BE HELD FOR 5 DAYS BEFORE ISSUING A FINAL NEGATIVE REPORT   Report Status  PENDING   Incomplete   MRSA PCR SCREENING     Status: Abnormal   Collection Time   01/12/12  4:03 AM      Component Value Range Status Comment   MRSA by PCR POSITIVE (*) NEGATIVE  Final   CLOSTRIDIUM DIFFICILE BY PCR     Status: Abnormal   Collection Time   01/13/12  7:50 AM      Component Value Range Status Comment   C difficile by pcr POSITIVE (*) NEGATIVE  Final     Studies/Results: Ir Perc Nephrostomy Left  01/13/2012  *RADIOLOGY REPORT*  Indication:  Obstructing left sided UPJ stone with concern for infected collecting system.  1.  FLUOROSCOPIC GUIDANCE FOR PUNCTURE OF THE LEFT RENAL COLLECTING SYSTEM. 2. LEFT SIDED PERCUTANEOUS NEPHROSTOMY TUBE PLACEMENT  Comparison: CT of the abdomen and pelvis - 01/12/2012; 03/18/2010  Intravenous medications: Fentanyl 200 mcg IV; Versed 4 mg IV; The  patient is currently admitted to the hospital and on intravenous antibiotics. The antibiotic was administered in an appropriate time frame prior to skin puncture.  Total Moderate Sedation Time: 65 minutes.  Contrast: 60 mL Omnipaque-300 (administered into the collecting system)  Fluoroscopy Time: 17.1 minutes.  Complications: None immediate  Procedure:  Informed written consent was obtained from the patient's family after a discussion of the risks, benefits, and alternatives to treatment.  The left flank region was prepped with Betadine in a sterile fashion, and a sterile drape was applied covering the operative field.  A sterile gown and sterile gloves were used for the procedure.  A timeout was performed prior to the initiation of the procedure.  A pre procedural spot fluoroscopic image was obtained of the upper abdomen.  Ultrasound scanning performed of the kidney was negative for significant hydronephrosis.  As such, the stone within left UPJ was targeted fluoroscopically with a 22 gauge Chiba needle.  Access to the collecting system was confirmed with advancement of a Nitrex wire into the collecting system.  The needle was exchanged for the inner 3 French catheter from an Accustick set and contrast injection confirmed access.  A 3-French catheter was utilized to maintain access to the collecting system due to the patient's spontaneous movement on the fluoroscopy table and inability to cooperate with the procedure.  A small amount of air was injected into the collecting system to help delineate a posterior calyx.  A posterior calyx was targeted with a 22 gauge Chiba needle.  Access to the calyx was confirmed with advancement of a Nitrex wire into the collecting system.  An Accustick set was utilized to dilate the tract. Over a Benson wire, the tract was serially dilated to 10- [place].  Initially a 10-French percutaneous nephrostomy tube was advanced to the superior aspect of the left ureter, however given the  small size of the renal pelvis, the nephrostomy was unable to be coiled.  As such, this catheter was exchanged for a 10-French Renee Pain catheter which was ultimately coiled within the superior aspect of the collecting system. Postprocedural spot radiographs were obtained in various obliquities and the catheter was sutured to the skin.  The catheter was capped and a dressing was placed.  The patient tolerated the procedure well without immediate postprocedural complication.  Findings:  Pre procedural spot radiographic images demonstrates an approximately 1.1 x 5.5 cm stone overlying the expected location of the left UPJ, as demonstrated on preprocedural abdominal CT. Several additional smaller stones are identified within the left kidney.  Ultrasound scanning was negative  for significant hydronephrosis and as such, the UPJ stone was targeted fluoroscopically allowing access to the collecting system.  With the collecting system now opacified, a posterior calyx was targeted fluoroscopically ultimately allowing placement of a 10-French Renee Pain catheter coiled and locked within the superior aspect of the left collecting system.  IMPRESSION:  Successful fluoroscopic guided placement of a left sided 10-French Renee Pain catheter coiled within the superior aspect of the left collecting system.  Original Report Authenticated By: Waynard Reeds, M.D.   Ir US Guide Bx Asp/drain  01/13/2012  *RADIOLOGY REPORT*  Indication:  Obstructing left sided UPJ stone with concern for infected collecting system.  1.  FLUOROSCOPIC GUIDANCE FOR PUNCTURE OF THE LEFT RENAL COLLECTING SYSTEM. 2. LEFT SIDED PERCUTANEOUS NEPHROSTOMY TUBE PLACEMENT  Comparison: CT of the abdomen and pelvis - 01/12/2012; 03/18/2010  Intravenous medications: Fentanyl 200 mcg IV; Versed 4 mg IV; The patient is currently admitted to the hospital and on intravenous antibiotics. The antibiotic was administered in an appropriate time frame prior to  skin puncture.  Total Moderate Sedation Time: 65 minutes.  Contrast: 60 mL Omnipaque-300 (administered into the collecting system)  Fluoroscopy Time: 17.1 minutes.  Complications: None immediate  Procedure:  Informed written consent was obtained from the patient's family after a discussion of the risks, benefits, and alternatives to treatment.  The left flank region was prepped with Betadine in a sterile fashion, and a sterile drape was applied covering the operative field.  A sterile gown and sterile gloves were used for the procedure.  A timeout was performed prior to the initiation of the procedure.  A pre procedural spot fluoroscopic image was obtained of the upper abdomen.  Ultrasound scanning performed of the kidney was negative for significant hydronephrosis.  As such, the stone within left UPJ was targeted fluoroscopically with a 22 gauge Chiba needle.  Access to the collecting system was confirmed with advancement of a Nitrex wire into the collecting system.  The needle was exchanged for the inner 3 French catheter from an Accustick set and contrast injection confirmed access.  A 3-French catheter was utilized to maintain access to the collecting system due to the patient's spontaneous movement on the fluoroscopy table and inability to cooperate with the procedure.  A small amount of air was injected into the collecting system to help delineate a posterior calyx.  A posterior calyx was targeted with a 22 gauge Chiba needle.  Access to the calyx was confirmed with advancement of a Nitrex wire into the collecting system.  An Accustick set was utilized to dilate the tract. Over a Benson wire, the tract was serially dilated to 10- [place].  Initially a 10-French percutaneous nephrostomy tube was advanced to the superior aspect of the left ureter, however given the small size of the renal pelvis, the nephrostomy was unable to be coiled.  As such, this catheter was exchanged for a 10-French Renee Pain catheter  which was ultimately coiled within the superior aspect of the collecting system. Postprocedural spot radiographs were obtained in various obliquities and the catheter was sutured to the skin.  The catheter was capped and a dressing was placed.  The patient tolerated the procedure well without immediate postprocedural complication.  Findings:  Pre procedural spot radiographic images demonstrates an approximately 1.1 x 5.5 cm stone overlying the expected location of the left UPJ, as demonstrated on preprocedural abdominal CT. Several additional smaller stones are identified within the left kidney.  Ultrasound scanning was negative for significant hydronephrosis  and as such, the UPJ stone was targeted fluoroscopically allowing access to the collecting system.  With the collecting system now opacified, a posterior calyx was targeted fluoroscopically ultimately allowing placement of a 10-French Renee Pain catheter coiled and locked within the superior aspect of the left collecting system.  IMPRESSION:  Successful fluoroscopic guided placement of a left sided 10-French Renee Pain catheter coiled within the superior aspect of the left collecting system.  Original Report Authenticated By: Waynard Reeds, M.D.    Medications: I have reviewed the patient's current medications. Scheduled Meds:    . Chlorhexidine Gluconate Cloth  6 each Topical Q0600  . cloNIDine  0.1 mg Transdermal Weekly  . enoxaparin  40 mg Subcutaneous Q24H  . lactulose  30 g Oral Daily  . vancomycin  500 mg Oral Q6H   And  . metronidazole  500 mg Intravenous Q8H  . mupirocin ointment  1 application Nasal BID  . PHENObarbital  160 mg Oral Daily  . phenytoin  200 mg Oral BID  . piperacillin-tazobactam (ZOSYN)  IV  3.375 g Intravenous Q8H  . potassium chloride  40 mEq Oral Once   Continuous Infusions:    . dextrose 100 mL/hr at 01/15/12 0715  . DISCONTD: 0.45 % NaCl with KCl 20 mEq / L 75 mL/hr at 01/14/12 1614  . DISCONTD:  dextrose 5 % and 0.45% NaCl     PRN Meds:.albuterol, ipratropium  Assessment/Plan: Healthcare-associated pneumonia / Leukocytosis / Sepsis / C. difficile colitis. The patient was admitted and blood cultures show no growth to date.  Patient has multiple infectious sources including his urinary tract, pneumonia and clostridium difficile colitis.  Levofloxacin on February 18 of 2013, discontinued. Empiric vancomycin and Zosyn since 01/12/2012.  Discontinue vancomycin IV on February 20 of 2012.  Patient is on IV metronidazole and oral vancomycin since 01/13/2012 for C. difficile colitis.  Oxygen saturations are stable.  Leukocytosis improving. Speech therapy evaluation done is recommending dysphagia 3 diet with thin liquids with full supervision.    ARF (acute renal failure) / Nephrolithiasis /Hydronephrosis / Hypernatremia. Status post urological consultation.  A percutaneous nephrostomy tube done on 01/13/2012. Will need outpatient lithotripsy when stable.  Hypernatremia likely due to acute renal failure continue to monitor carefully, fluids change to D5W continue hydration.  Suspect hyponatremia is likely due to poor by mouth intake.  Once oral intake improves suspect hypernatremia should resolve.  Epilepsy  The patient is being maintained on his home doses of dilantin and phenobarbital. No seizure events noted in the hospital.   Mental retardation  The patient understands "yes or no" but no other communication. He does not talk and stopped ambulating ~ 6 yrs ago. He does not sit up on his own. He usually has a good appetite and eats regular food.   Clostridium difficile colitis  Known history of ulcerative colitis. No evidence of obstruction or ileus. Stool for clostridium difficile by PCR noted to be positive.  Continue IV metronidazole and oral Vancomycin.  Has not had any further episodes of diarrhea.  Hyperglycemia  Hemoglobin A1c is normal at  5.5%.  Prophylaxis Lovenox  Disposition Pending.   LOS: 4 days  Nolberto Cheuvront A, MD 01/15/2012, 4:49 PM

## 2012-01-15 NOTE — Progress Notes (Signed)
Subjective: Patient asleep, did not awaken.   Objective: Vital signs in last 24 hours: Temp:  [97.6 F (36.4 C)-98.4 F (36.9 C)] 98.4 F (36.9 C) (02/21 0511) Pulse Rate:  [81-104] 81  (02/21 0511) Resp:  [16-20] 20  (02/21 0511) BP: (116-138)/(76-81) 116/81 mmHg (02/21 0511) SpO2:  [90 %-91 %] 91 % (02/21 0511) Last BM Date: 01/13/12  Intake/Output from previous day: 02/20 0701 - 02/21 0700 In: 260 [P.O.:160; IV Piggyback:100] Out: 1475 [Urine:1475]   ??? How much from PCN and from foley - not separated in I/O page.      Physical exam : WBC count now WNL.   Left PCN intact with more clear urine in bag - yesterday blood tinged - today more amber in color.  Urine in foley clear.  Renal function now WNL. Approximately 200 ml in bag.   Plan to continue PCN until litho treatment for stones per Dr. Annabell Howells.    Lab Results:   Basename 01/15/12 0330 01/13/12 0525  WBC 6.7 21.2*  HGB 12.4* 12.5*  HCT 36.8* 37.3*  PLT 280 205   BMET  Basename 01/15/12 0330 01/14/12 1229  NA 164* 164*  K 3.1* 2.9*  CL 127* 125*  CO2 26 22  GLUCOSE 132* 117*  BUN 23 30*  CREATININE 0.82 0.910.94  CALCIUM 8.5 8.8   PT/INR No results found for this basename: LABPROT:2,INR:2 in the last 72 hours ABG No results found for this basename: PHART:2,PCO2:2,PO2:2,HCO3:2 in the last 72 hours  Studies/Results: Ir Perc Nephrostomy Left  01/13/2012  *RADIOLOGY REPORT*  Indication:  Obstructing left sided UPJ stone with concern for infected collecting system.  1.  FLUOROSCOPIC GUIDANCE FOR PUNCTURE OF THE LEFT RENAL COLLECTING SYSTEM. 2. LEFT SIDED PERCUTANEOUS NEPHROSTOMY TUBE PLACEMENT  Comparison: CT of the abdomen and pelvis - 01/12/2012; 03/18/2010  Intravenous medications: Fentanyl 200 mcg IV; Versed 4 mg IV; The patient is currently admitted to the hospital and on intravenous antibiotics. The antibiotic was administered in an appropriate time frame prior to skin puncture.  Total Moderate Sedation  Time: 65 minutes.  Contrast: 60 mL Omnipaque-300 (administered into the collecting system)  Fluoroscopy Time: 17.1 minutes.  Complications: None immediate  Procedure:  Informed written consent was obtained from the patient's family after a discussion of the risks, benefits, and alternatives to treatment.  The left flank region was prepped with Betadine in a sterile fashion, and a sterile drape was applied covering the operative field.  A sterile gown and sterile gloves were used for the procedure.  A timeout was performed prior to the initiation of the procedure.  A pre procedural spot fluoroscopic image was obtained of the upper abdomen.  Ultrasound scanning performed of the kidney was negative for significant hydronephrosis.  As such, the stone within left UPJ was targeted fluoroscopically with a 22 gauge Chiba needle.  Access to the collecting system was confirmed with advancement of a Nitrex wire into the collecting system.  The needle was exchanged for the inner 3 French catheter from an Accustick set and contrast injection confirmed access.  A 3-French catheter was utilized to maintain access to the collecting system due to the patient's spontaneous movement on the fluoroscopy table and inability to cooperate with the procedure.  A small amount of air was injected into the collecting system to help delineate a posterior calyx.  A posterior calyx was targeted with a 22 gauge Chiba needle.  Access to the calyx was confirmed with advancement of a Nitrex wire into  the collecting system.  An Accustick set was utilized to dilate the tract. Over a Benson wire, the tract was serially dilated to 10- [place].  Initially a 10-French percutaneous nephrostomy tube was advanced to the superior aspect of the left ureter, however given the small size of the renal pelvis, the nephrostomy was unable to be coiled.  As such, this catheter was exchanged for a 10-French Renee Pain catheter which was ultimately coiled within the  superior aspect of the collecting system. Postprocedural spot radiographs were obtained in various obliquities and the catheter was sutured to the skin.  The catheter was capped and a dressing was placed.  The patient tolerated the procedure well without immediate postprocedural complication.  Findings:  Pre procedural spot radiographic images demonstrates an approximately 1.1 x 5.5 cm stone overlying the expected location of the left UPJ, as demonstrated on preprocedural abdominal CT. Several additional smaller stones are identified within the left kidney.  Ultrasound scanning was negative for significant hydronephrosis and as such, the UPJ stone was targeted fluoroscopically allowing access to the collecting system.  With the collecting system now opacified, a posterior calyx was targeted fluoroscopically ultimately allowing placement of a 10-French Renee Pain catheter coiled and locked within the superior aspect of the left collecting system.  IMPRESSION:  Successful fluoroscopic guided placement of a left sided 10-French Renee Pain catheter coiled within the superior aspect of the left collecting system.  Original Report Authenticated By: Waynard Reeds, M.D.   Ir US Guide Bx Asp/drain  01/13/2012  *RADIOLOGY REPORT*  Indication:  Obstructing left sided UPJ stone with concern for infected collecting system.  1.  FLUOROSCOPIC GUIDANCE FOR PUNCTURE OF THE LEFT RENAL COLLECTING SYSTEM. 2. LEFT SIDED PERCUTANEOUS NEPHROSTOMY TUBE PLACEMENT  Comparison: CT of the abdomen and pelvis - 01/12/2012; 03/18/2010  Intravenous medications: Fentanyl 200 mcg IV; Versed 4 mg IV; The patient is currently admitted to the hospital and on intravenous antibiotics. The antibiotic was administered in an appropriate time frame prior to skin puncture.  Total Moderate Sedation Time: 65 minutes.  Contrast: 60 mL Omnipaque-300 (administered into the collecting system)  Fluoroscopy Time: 17.1 minutes.  Complications: None  immediate  Procedure:  Informed written consent was obtained from the patient's family after a discussion of the risks, benefits, and alternatives to treatment.  The left flank region was prepped with Betadine in a sterile fashion, and a sterile drape was applied covering the operative field.  A sterile gown and sterile gloves were used for the procedure.  A timeout was performed prior to the initiation of the procedure.  A pre procedural spot fluoroscopic image was obtained of the upper abdomen.  Ultrasound scanning performed of the kidney was negative for significant hydronephrosis.  As such, the stone within left UPJ was targeted fluoroscopically with a 22 gauge Chiba needle.  Access to the collecting system was confirmed with advancement of a Nitrex wire into the collecting system.  The needle was exchanged for the inner 3 French catheter from an Accustick set and contrast injection confirmed access.  A 3-French catheter was utilized to maintain access to the collecting system due to the patient's spontaneous movement on the fluoroscopy table and inability to cooperate with the procedure.  A small amount of air was injected into the collecting system to help delineate a posterior calyx.  A posterior calyx was targeted with a 22 gauge Chiba needle.  Access to the calyx was confirmed with advancement of a Nitrex wire into the collecting system.  An Accustick set was utilized to dilate the tract. Over a Benson wire, the tract was serially dilated to 10- [place].  Initially a 10-French percutaneous nephrostomy tube was advanced to the superior aspect of the left ureter, however given the small size of the renal pelvis, the nephrostomy was unable to be coiled.  As such, this catheter was exchanged for a 10-French Renee Pain catheter which was ultimately coiled within the superior aspect of the collecting system. Postprocedural spot radiographs were obtained in various obliquities and the catheter was sutured to  the skin.  The catheter was capped and a dressing was placed.  The patient tolerated the procedure well without immediate postprocedural complication.  Findings:  Pre procedural spot radiographic images demonstrates an approximately 1.1 x 5.5 cm stone overlying the expected location of the left UPJ, as demonstrated on preprocedural abdominal CT. Several additional smaller stones are identified within the left kidney.  Ultrasound scanning was negative for significant hydronephrosis and as such, the UPJ stone was targeted fluoroscopically allowing access to the collecting system.  With the collecting system now opacified, a posterior calyx was targeted fluoroscopically ultimately allowing placement of a 10-French Renee Pain catheter coiled and locked within the superior aspect of the left collecting system.  IMPRESSION:  Successful fluoroscopic guided placement of a left sided 10-French Renee Pain catheter coiled within the superior aspect of the left collecting system.  Original Report Authenticated By: Waynard Reeds, M.D.    Anti-infectives: Anti-infectives     Start     Dose/Rate Route Frequency Ordered Stop   01/13/12 1800   vancomycin (VANCOCIN) 50 mg/mL oral solution 500 mg        500 mg Oral 4 times per day 01/13/12 1455 01/27/12 1759   01/13/12 1500   metroNIDAZOLE (FLAGYL) IVPB 500 mg        500 mg 100 mL/hr over 60 Minutes Intravenous Every 8 hours 01/13/12 1455 01/27/12 1459   01/13/12 1300   metroNIDAZOLE (FLAGYL) IVPB 500 mg  Status:  Discontinued        500 mg 100 mL/hr over 60 Minutes Intravenous Every 8 hours 01/13/12 1217 01/13/12 1455   01/13/12 0500   Levofloxacin (LEVAQUIN) IVPB 750 mg  Status:  Discontinued        750 mg 100 mL/hr over 90 Minutes Intravenous Every 24 hours 01/12/12 0450 01/12/12 0600   01/12/12 1200   vancomycin (VANCOCIN) 750 mg in sodium chloride 0.9 % 150 mL IVPB  Status:  Discontinued        750 mg 150 mL/hr over 60 Minutes Intravenous Every  12 hours 01/12/12 0450 01/14/12 1625   01/12/12 0800   piperacillin-tazobactam (ZOSYN) IVPB 3.375 g        3.375 g 12.5 mL/hr over 240 Minutes Intravenous Every 8 hours 01/12/12 0450     01/12/12 0400   Levofloxacin (LEVAQUIN) IVPB 750 mg        750 mg 100 mL/hr over 90 Minutes Intravenous  Once 01/12/12 0334 01/12/12 0552   01/12/12 0015   vancomycin (VANCOCIN) IVPB 1000 mg/200 mL premix        1,000 mg 200 mL/hr over 60 Minutes Intravenous  Once 01/11/12 2350 01/12/12 0107   01/12/12 0015   piperacillin-tazobactam (ZOSYN) IVPB 3.375 g        3.375 g 12.5 mL/hr over 240 Minutes Intravenous  Once 01/11/12 2350 01/12/12 0407          Assessment/Plan:  Left PCN placed 01/13/12 secondary to  renal stones and ARF.  Draining well, will have RN place on I/O page to know directly how much coming from PCN - appears > daily.  Continue PCN until treatment per Dr. Annabell Howells for nephrolithiasis. Improved leukocytosis.   LOS: 4 days    Enyla Lisbon D 01/15/2012

## 2012-01-15 NOTE — Progress Notes (Deleted)
55 YOM adm 2/17 w/fever and hypoxia. Sepsis w/ bilat renal stones. Pt from [facility], hx mental retardation  2/17 >> Zosyn >> 2/17 >> Vanc IV >> 2/20 2/18 >> Levaquin >> 2/18 2/19 >> Flagyl >> 2/19 >> Vanc PO >>  Tm Afebrile WBC improved to wnl. Renal wnl   Cx: 2/18 MRSA by PCR positive 2/18 Bcx x 2 NGTD 2/19 Cdiff positive  Radiographs 2/17 CXR = Dense right mid and upper lung zone pneumonia, Diffuse colonic ileus or distal obstruction 2/18 CT = Partial consolidation of the right lower lung lobe, concerning for pneumonia, though significant atelectasis could have a similar appearance  Vanc Levels: 2/20: VT 20.1  2/20: D4 Vanc 750mg  IV q12 (after 1g in ER 2/17)/Zosyn EI. D2 Flagyl IV 500mg  q8h/Vanc PO 500mg  q6h per Cdiff protocol. No PPI on board. Est wt used (200#). Wt documented as 90.7kg. PCN done 2/19, plan perc lithotomy 2-4 weeks. Vanc tr at high end of range but Scr has improved so imagine this will come down so no change to dose

## 2012-01-15 NOTE — ED Provider Notes (Signed)
Medical screening examination/treatment/procedure(s) were performed by non-physician practitioner and as supervising physician I was immediately available for consultation/collaboration.  Dietrich Samuelson P Rayvon Brandvold, MD 01/15/12 0712 

## 2012-01-16 ENCOUNTER — Other Ambulatory Visit: Payer: Self-pay | Admitting: Urology

## 2012-01-16 LAB — BASIC METABOLIC PANEL
CO2: 27 mEq/L (ref 19–32)
Calcium: 7.9 mg/dL — ABNORMAL LOW (ref 8.4–10.5)
Chloride: 121 mEq/L — ABNORMAL HIGH (ref 96–112)
Creatinine, Ser: 0.71 mg/dL (ref 0.50–1.35)
GFR calc Af Amer: >90 mL/min (ref >90)
Sodium: 156 mEq/L — ABNORMAL HIGH (ref 135–145)

## 2012-01-16 LAB — CBC
Platelets: 272 10*3/uL (ref 150–400)
RBC: 4.51 MIL/uL (ref 4.22–5.81)
RDW: 16.7 % — ABNORMAL HIGH (ref 11.5–15.5)
WBC: 7.9 10*3/uL (ref 4.0–10.5)

## 2012-01-16 LAB — MAGNESIUM: Magnesium: 1.9 mg/dL (ref 1.5–2.5)

## 2012-01-16 MED ORDER — POTASSIUM CHLORIDE CRYS ER 20 MEQ PO TBCR
20.0000 meq | EXTENDED_RELEASE_TABLET | Freq: Once | ORAL | Status: AC
  Administered 2012-01-16: 20 meq via ORAL
  Filled 2012-01-16: qty 1

## 2012-01-16 NOTE — Progress Notes (Signed)
ANTIBIOTIC CONSULT NOTE - FOLLOW UP  Pharmacy Consult for Zosyn Indication: HCAP/Sepsis  No Known Allergies  Patient Measurements: Height: 5\' 5"  (165.1 cm) Weight: 200 lb (90.719 kg) IBW/kg (Calculated) : 61.5    Vital Signs: Temp: 97.1 F (36.2 C) (02/22 0543) Temp src: Axillary (02/22 0543) BP: 112/77 mmHg (02/22 0543) Pulse Rate: 86  (02/22 0543) Intake/Output from previous day: 02/21 0701 - 02/22 0700 In: 1555 [P.O.:5; I.V.:1500; IV Piggyback:50] Out: 1126 [Urine:1125; Stool:1] Intake/Output from this shift:    Labs:  Basename 01/16/12 0338 01/15/12 1700 01/15/12 0330  WBC 7.9 -- 6.7  HGB 13.9 -- 12.4*  PLT 272 -- 280  LABCREA -- -- --  CREATININE 0.71 0.73 0.82   Estimated Creatinine Clearance: 110.6 ml/min (by C-G formula based on Cr of 0.71).  Basename 01/14/12 1229  VANCOTROUGH 20.1*  VANCOPEAK --  Drue Dun --  GENTTROUGH --  GENTPEAK --  GENTRANDOM --  TOBRATROUGH --  TOBRAPEAK --  TOBRARND --  AMIKACINPEAK --  AMIKACINTROU --  AMIKACIN --     Microbiology: Recent Results (from the past 720 hour(s))  CULTURE, BLOOD (ROUTINE X 2)     Status: Normal (Preliminary result)   Collection Time   01/12/12 12:00 AM      Component Value Range Status Comment   Specimen Description BLOOD LEFT HAND   Final    Special Requests BOTTLES DRAWN AEROBIC ONLY 2 CC   Final    Culture  Setup Time [id number]   Final    Culture     Final    Value:        BLOOD CULTURE RECEIVED NO GROWTH TO DATE CULTURE WILL BE HELD FOR 5 DAYS BEFORE ISSUING A FINAL NEGATIVE REPORT   Report Status PENDING   Incomplete   CULTURE, BLOOD (ROUTINE X 2)     Status: Normal (Preliminary result)   Collection Time   01/12/12  2:15 AM      Component Value Range Status Comment   Specimen Description BLOOD LEFT FINGER   Final    Special Requests BOTTLES DRAWN AEROBIC ONLY 5 CC   Final    Culture  Setup Time [id number]   Final    Culture     Final    Value:        BLOOD CULTURE RECEIVED  NO GROWTH TO DATE CULTURE WILL BE HELD FOR 5 DAYS BEFORE ISSUING A FINAL NEGATIVE REPORT   Report Status PENDING   Incomplete   MRSA PCR SCREENING     Status: Abnormal   Collection Time   01/12/12  4:03 AM      Component Value Range Status Comment   MRSA by PCR POSITIVE (*) NEGATIVE  Final   CLOSTRIDIUM DIFFICILE BY PCR     Status: Abnormal   Collection Time   01/13/12  7:50 AM      Component Value Range Status Comment   C difficile by pcr POSITIVE (*) NEGATIVE  Final     Anti-infectives     Start     Dose/Rate Route Frequency Ordered Stop   01/13/12 1800   vancomycin (VANCOCIN) 50 mg/mL oral solution 500 mg        500 mg Oral 4 times per day 01/13/12 1455 01/27/12 1759   01/13/12 1500   metroNIDAZOLE (FLAGYL) IVPB 500 mg        500 mg 100 mL/hr over 60 Minutes Intravenous Every 8 hours 01/13/12 1455 01/27/12 1459   01/13/12 1300   metroNIDAZOLE (FLAGYL)  IVPB 500 mg  Status:  Discontinued        500 mg 100 mL/hr over 60 Minutes Intravenous Every 8 hours 01/13/12 1217 01/13/12 1455   01/13/12 0500   Levofloxacin (LEVAQUIN) IVPB 750 mg  Status:  Discontinued        750 mg 100 mL/hr over 90 Minutes Intravenous Every 24 hours 01/12/12 0450 01/12/12 0600   01/12/12 1200   vancomycin (VANCOCIN) 750 mg in sodium chloride 0.9 % 150 mL IVPB  Status:  Discontinued        750 mg 150 mL/hr over 60 Minutes Intravenous Every 12 hours 01/12/12 0450 01/14/12 1625   01/12/12 0800   piperacillin-tazobactam (ZOSYN) IVPB 3.375 g        3.375 g 12.5 mL/hr over 240 Minutes Intravenous Every 8 hours 01/12/12 0450     01/12/12 0400   Levofloxacin (LEVAQUIN) IVPB 750 mg        750 mg 100 mL/hr over 90 Minutes Intravenous  Once 01/12/12 0334 01/12/12 0552   01/12/12 0015   vancomycin (VANCOCIN) IVPB 1000 mg/200 mL premix        1,000 mg 200 mL/hr over 60 Minutes Intravenous  Once 01/11/12 2350 01/12/12 0107   01/12/12 0015   piperacillin-tazobactam (ZOSYN) IVPB 3.375 g        3.375 g 12.5 mL/hr  over 240 Minutes Intravenous  Once 01/11/12 2350 01/12/12 0407          Assessment:  56 yo M on day#6 Zosyn for HCAP/Sepsis.  Day #4 Flagyl/PO Vanc for C.diff colitis.  Afebrile  WBCs wnl  SCr wnl  Blood cultures remain negative.    Goal of Therapy:  Vancomycin trough level 15-20 mcg/ml  Plan:   Continue Zosyn 3.375g IV Q8H infused over 4hrs. Not likely to need a dose adjustment.  Length of therapy per MD.  Pharmacy will sign-off.  Charolotte Eke, PharmD, pager [phone]. 01/16/2012,10:43 AM.

## 2012-01-16 NOTE — Progress Notes (Signed)
CSW continues to follow for return to [facility] upon discharge. Ethelreda Sukhu C. Izzabelle Bouley MSW, LCSW [phone]

## 2012-01-16 NOTE — Progress Notes (Signed)
Subjective: Resting, seems less agitated.   Objective: Vital signs in last 24 hours: Temp:  [97.1 F (36.2 C)-98.7 F (37.1 C)] 98.7 F (37.1 C) (02/22 1506) Pulse Rate:  [86-107] 93  (02/22 1506) Resp:  [16-20] 18  (02/22 1506) BP: (112-138)/(77-91) 124/91 mmHg (02/22 1506) SpO2:  [92 %-94 %] 92 % (02/22 1506) Last BM Date: 01/13/12  Intake/Output from previous day: 02/21 0701 - 02/22 0700 In: 1555 [P.O.:5; I.V.:1500; IV Piggyback:50] Out: 1126 [Urine:1125; Stool:1]  Physical exam :  Left PCN remains - slightly less blood tinged today - 925 ml documented output since yesterday with renal function normal.  No evidence of leakage around catheter. Foley urine remains clear.   Lab Results:   Basename 01/16/12 0338 01/15/12 0330  WBC 7.9 6.7  HGB 13.9 12.4*  HCT 42.5 36.8*  PLT 272 280   BMET  Basename 01/16/12 0338 01/15/12 1700  NA 156* 165*  K 3.4* 2.9*  CL 121* 127*  CO2 27 29  GLUCOSE 210* 135*  BUN 13 17  CREATININE 0.71 0.73  CALCIUM 7.9* 8.2*   PT/INR No results found for this basename: LABPROT:2,INR:2 in the last 72 hours ABG No results found for this basename: PHART:2,PCO2:2,PO2:2,HCO3:2 in the last 72 hours  Studies/Results: No results found.  Anti-infectives: Anti-infectives     Start     Dose/Rate Route Frequency Ordered Stop   01/13/12 1800   vancomycin (VANCOCIN) 50 mg/mL oral solution 500 mg        500 mg Oral 4 times per day 01/13/12 1455 01/27/12 1759   01/13/12 1500   metroNIDAZOLE (FLAGYL) IVPB 500 mg        500 mg 100 mL/hr over 60 Minutes Intravenous Every 8 hours 01/13/12 1455 01/27/12 1459   01/13/12 1300   metroNIDAZOLE (FLAGYL) IVPB 500 mg  Status:  Discontinued        500 mg 100 mL/hr over 60 Minutes Intravenous Every 8 hours 01/13/12 1217 01/13/12 1455   01/13/12 0500   Levofloxacin (LEVAQUIN) IVPB 750 mg  Status:  Discontinued        750 mg 100 mL/hr over 90 Minutes Intravenous Every 24 hours 01/12/12 0450 01/12/12 0600     01/12/12 1200   vancomycin (VANCOCIN) 750 mg in sodium chloride 0.9 % 150 mL IVPB  Status:  Discontinued        750 mg 150 mL/hr over 60 Minutes Intravenous Every 12 hours 01/12/12 0450 01/14/12 1625   01/12/12 0800   piperacillin-tazobactam (ZOSYN) IVPB 3.375 g        3.375 g 12.5 mL/hr over 240 Minutes Intravenous Every 8 hours 01/12/12 0450     01/12/12 0400   Levofloxacin (LEVAQUIN) IVPB 750 mg        750 mg 100 mL/hr over 90 Minutes Intravenous  Once 01/12/12 0334 01/12/12 0552   01/12/12 0015   vancomycin (VANCOCIN) IVPB 1000 mg/200 mL premix        1,000 mg 200 mL/hr over 60 Minutes Intravenous  Once 01/11/12 2350 01/12/12 0107   01/12/12 0015   piperacillin-tazobactam (ZOSYN) IVPB 3.375 g        3.375 g 12.5 mL/hr over 240 Minutes Intravenous  Once 01/11/12 2350 01/12/12 0407          Assessment/Plan:  Renal stone s/p left PCN placement 2/19.  Urine clearing some - but may not completely given stones.  Drain to remain until litho treatment per urology - to be scheduled once patient improved from multiple  infectious processes. IR to follow twice weekly while remains IP - and of course PRN for any acute issues with drain.   LOS: 5 days    Whitley Strycharz D 01/16/2012

## 2012-01-16 NOTE — Progress Notes (Signed)
Subjective: Patient is more calm and rested today.  Appetite still poor.  Objective: Vital signs in last 24 hours: Filed Vitals:   01/15/12 0511 01/15/12 1335 01/15/12 2143 01/16/12 0543  BP: 116/81 119/76 138/89 112/77  Pulse: 81 108 107 86  Temp: 98.4 F (36.9 C) 98.6 F (37 C) 98.4 F (36.9 C) 97.1 F (36.2 C)  TempSrc: Axillary Axillary Axillary Axillary  Resp: 20 18 16 20   Height:      Weight:      SpO2: 91% 94% 92% 94%   Weight change:   Intake/Output Summary (Last 24 hours) at 01/16/12 1404 Last data filed at 01/16/12 0544  Gross per 24 hour  Intake   1550 ml  Output    725 ml  Net    825 ml    Physical Exam: General: Awake, No acute distress, nonverbal. HEENT: EOMI. Neck: Supple CV: S1 and S2 Lungs: Clear to ascultation bilaterally Abdomen: Soft, Nontender, Nondistended, +bowel sounds. Ext: Good pulses. Trace edema. Back: Percutaneous drain in place.  Lab Results:  Basename 01/16/12 0338 01/15/12 1700  NA 156* 165*  K 3.4* 2.9*  CL 121* 127*  CO2 27 29  GLUCOSE 210* 135*  BUN 13 17  CREATININE 0.71 0.73  CALCIUM 7.9* 8.2*  MG 1.9 --  PHOS -- --   No results found for this basename: AST:2,ALT:2,ALKPHOS:2,BILITOT:2,PROT:2,ALBUMIN:2 in the last 72 hours No results found for this basename: LIPASE:2,AMYLASE:2 in the last 72 hours  Basename 01/16/12 0338 01/15/12 0330  WBC 7.9 6.7  NEUTROABS -- --  HGB 13.9 12.4*  HCT 42.5 36.8*  MCV 94.2 92.2  PLT 272 280   No results found for this basename: CKTOTAL:3,CKMB:3,CKMBINDEX:3,TROPONINI:3 in the last 72 hours No components found with this basename: POCBNP:3 No results found for this basename: DDIMER:2 in the last 72 hours No results found for this basename: HGBA1C:2 in the last 72 hours No results found for this basename: CHOL:2,HDL:2,LDLCALC:2,TRIG:2,CHOLHDL:2,LDLDIRECT:2 in the last 72 hours No results found for this basename: TSH,T4TOTAL,FREET3,T3FREE,THYROIDAB in the last 72 hours No results  found for this basename: VITAMINB12:2,FOLATE:2,FERRITIN:2,TIBC:2,IRON:2,RETICCTPCT:2 in the last 72 hours  Micro Results: Recent Results (from the past 240 hour(s))  CULTURE, BLOOD (ROUTINE X 2)     Status: Normal (Preliminary result)   Collection Time   01/12/12 12:00 AM      Component Value Range Status Comment   Specimen Description BLOOD LEFT HAND   Final    Special Requests BOTTLES DRAWN AEROBIC ONLY 2 CC   Final    Culture  Setup Time [id number]   Final    Culture     Final    Value:        BLOOD CULTURE RECEIVED NO GROWTH TO DATE CULTURE WILL BE HELD FOR 5 DAYS BEFORE ISSUING A FINAL NEGATIVE REPORT   Report Status PENDING   Incomplete   CULTURE, BLOOD (ROUTINE X 2)     Status: Normal (Preliminary result)   Collection Time   01/12/12  2:15 AM      Component Value Range Status Comment   Specimen Description BLOOD LEFT FINGER   Final    Special Requests BOTTLES DRAWN AEROBIC ONLY 5 CC   Final    Culture  Setup Time [id number]   Final    Culture     Final    Value:        BLOOD CULTURE RECEIVED NO GROWTH TO DATE CULTURE WILL BE HELD FOR 5 DAYS BEFORE ISSUING A FINAL NEGATIVE REPORT  Report Status PENDING   Incomplete   MRSA PCR SCREENING     Status: Abnormal   Collection Time   01/12/12  4:03 AM      Component Value Range Status Comment   MRSA by PCR POSITIVE (*) NEGATIVE  Final   CLOSTRIDIUM DIFFICILE BY PCR     Status: Abnormal   Collection Time   01/13/12  7:50 AM      Component Value Range Status Comment   C difficile by pcr POSITIVE (*) NEGATIVE  Final     Studies/Results: No results found.  Medications: I have reviewed the patient's current medications. Scheduled Meds:    . Chlorhexidine Gluconate Cloth  6 each Topical Q0600  . cloNIDine  0.1 mg Transdermal Weekly  . enoxaparin  40 mg Subcutaneous Q24H  . lactulose  30 g Oral Daily  . vancomycin  500 mg Oral Q6H   And  . metronidazole  500 mg Intravenous Q8H  . mupirocin ointment  1 application Nasal BID    . PHENObarbital  160 mg Oral Daily  . phenytoin  200 mg Oral BID  . piperacillin-tazobactam (ZOSYN)  IV  3.375 g Intravenous Q8H  . potassium chloride  20 mEq Oral Once  . potassium chloride  40 mEq Oral Once   Continuous Infusions:    . dextrose 150 mL/hr at 01/16/12 1259   PRN Meds:.albuterol, ipratropium  Assessment/Plan: Healthcare-associated pneumonia / Leukocytosis / Sepsis / C. difficile colitis. The patient was admitted and blood cultures show no growth to date.  Patient has multiple infectious sources including his urinary tract, pneumonia and clostridium difficile colitis.  Levofloxacin on February 18 of 2013, discontinued.  Vancomycin 2/18 - 01/14/2012.  Continue Zosyn since 01/12/2012.  Discontinue IV metronidazole.  Continue oral vancomycin for C. difficile colitis, antibiotic since 01/13/2012.  Oxygen saturations are stable.  Leukocytosis improving. Speech therapy evaluation done is recommending dysphagia 3 diet with thin liquids with full supervision.    ARF (acute renal failure) / Nephrolithiasis /Hydronephrosis / Hypernatremia. Status post urological consultation.  A percutaneous nephrostomy tube done on 01/13/2012. Will need outpatient lithotripsy when stable.  Hypernatremia likely due to acute renal failure continue to monitor carefully, continue D5W at 150 cc per hour.  Once able to tolerate oral intake will decrease D5W.  Hypernatremia is likely due to poor by mouth intake.  Hypernatremia improved.  Epilepsy  The patient is being maintained on his home doses of dilantin and phenobarbital. No seizure events noted in the hospital.   Mental retardation  The patient understands "yes or no" but no other communication. He does not talk and stopped ambulating ~ 6 yrs ago. He does not sit up on his own. He usually has a good appetite and eats regular food.   Clostridium difficile colitis  Known history of ulcerative colitis. No evidence of obstruction or ileus. Stool for  clostridium difficile by PCR noted to be positive.  Continue IV metronidazole and oral Vancomycin.  Has not had any further episodes of diarrhea.  Hyperglycemia  Hemoglobin A1c is normal at 5.5%.  Prophylaxis Lovenox  Disposition Pending.   LOS: 5 days  Abra Lingenfelter A, MD 01/16/2012, 2:04 PM

## 2012-01-17 LAB — BASIC METABOLIC PANEL
BUN: 6 mg/dL (ref 6–23)
BUN: 5 mg/dL — ABNORMAL LOW (ref 6–23)
Calcium: 7 mg/dL — ABNORMAL LOW (ref 8.4–10.5)
Chloride: 108 mEq/L (ref 96–112)
Creatinine, Ser: 0.64 mg/dL (ref 0.50–1.35)
GFR calc Af Amer: >90 mL/min (ref >90)
GFR calc Af Amer: >90 mL/min (ref >90)
GFR calc non Af Amer: >90 mL/min (ref >90)
GFR calc non Af Amer: >90 mL/min (ref >90)
Glucose, Bld: 124 mg/dL — ABNORMAL HIGH (ref 70–99)
Potassium: 2.1 mEq/L — CL (ref 3.5–5.1)
Potassium: 3.1 mEq/L — ABNORMAL LOW (ref 3.5–5.1)
Sodium: 145 mEq/L (ref 135–145)

## 2012-01-17 LAB — CBC
HCT: 37.9 % — ABNORMAL LOW (ref 39.0–52.0)
Hemoglobin: 12.3 g/dL — ABNORMAL LOW (ref 13.0–17.0)
MCHC: 32.5 g/dL (ref 30.0–36.0)
RDW: 16.5 % — ABNORMAL HIGH (ref 11.5–15.5)
WBC: 6.8 10*3/uL (ref 4.0–10.5)

## 2012-01-17 MED ORDER — POTASSIUM CHLORIDE CRYS ER 20 MEQ PO TBCR
40.0000 meq | EXTENDED_RELEASE_TABLET | Freq: Once | ORAL | Status: AC
  Administered 2012-01-17: 40 meq via ORAL
  Filled 2012-01-17: qty 2

## 2012-01-17 MED ORDER — POTASSIUM CHLORIDE 10 MEQ/100ML IV SOLN
10.0000 meq | INTRAVENOUS | Status: AC
  Administered 2012-01-17 (×5): 10 meq via INTRAVENOUS
  Filled 2012-01-17 (×5): qty 100

## 2012-01-17 NOTE — Progress Notes (Signed)
CRITICAL VALUE ALERT  Critical value received:  K+ 2.1  Date of notification:  01/17/12  Time of notification:  0517  Critical value read back: yes  Nurse who received alert:  Angelena Form, RN  MD notified (1st page):  Lenny Pastel, NP  Time of first page:  0518  MD notified (2nd page): N/A  Time of second page:  Responding MD:  Lenny Pastel  Time MD responded:  [phone]

## 2012-01-17 NOTE — Progress Notes (Signed)
Subjective: Patient still nonverbal.  Had low potassium today which is being aggressively replaced.  Objective: Vital signs in last 24 hours: Filed Vitals:   01/16/12 1506 01/16/12 2150 01/17/12 0645 01/17/12 1300  BP: 124/91 128/82 130/80 130/87  Pulse: 93 90 92 97  Temp: 98.7 F (37.1 C) 98.2 F (36.8 C) 98 F (36.7 C) 98.1 F (36.7 C)  TempSrc: Axillary Axillary Oral   Resp: 18 16 18 16   Height:      Weight:      SpO2: 92% 93% 94% 95%   Weight change:   Intake/Output Summary (Last 24 hours) at 01/17/12 1526 Last data filed at 01/17/12 1300  Gross per 24 hour  Intake    120 ml  Output    751 ml  Net   -631 ml    Physical Exam: General: Awake, No acute distress, nonverbal. HEENT: EOMI. Neck: Supple CV: S1 and S2 Lungs: Clear to ascultation bilaterally Abdomen: Soft, Nontender, Nondistended, +bowel sounds. Ext: Good pulses. Trace edema. Back: Percutaneous drain in place.  Lab Results:  Basename 01/17/12 0425 01/16/12 0338  NA 145 156*  K 2.1* 3.4*  CL 108 121*  CO2 30 27  GLUCOSE 177* 210*  BUN 6 13  CREATININE 0.72 0.71  CALCIUM 7.2* 7.9*  MG -- 1.9  PHOS -- --   No results found for this basename: AST:2,ALT:2,ALKPHOS:2,BILITOT:2,PROT:2,ALBUMIN:2 in the last 72 hours No results found for this basename: LIPASE:2,AMYLASE:2 in the last 72 hours  Basename 01/17/12 0425 01/16/12 0338  WBC 6.8 7.9  NEUTROABS -- --  HGB 12.3* 13.9  HCT 37.9* 42.5  MCV 93.3 94.2  PLT 297 272   No results found for this basename: CKTOTAL:3,CKMB:3,CKMBINDEX:3,TROPONINI:3 in the last 72 hours No components found with this basename: POCBNP:3 No results found for this basename: DDIMER:2 in the last 72 hours No results found for this basename: HGBA1C:2 in the last 72 hours No results found for this basename: CHOL:2,HDL:2,LDLCALC:2,TRIG:2,CHOLHDL:2,LDLDIRECT:2 in the last 72 hours No results found for this basename: TSH,T4TOTAL,FREET3,T3FREE,THYROIDAB in the last 72 hours No  results found for this basename: VITAMINB12:2,FOLATE:2,FERRITIN:2,TIBC:2,IRON:2,RETICCTPCT:2 in the last 72 hours  Micro Results: Recent Results (from the past 240 hour(s))  CULTURE, BLOOD (ROUTINE X 2)     Status: Normal (Preliminary result)   Collection Time   01/12/12 12:00 AM      Component Value Range Status Comment   Specimen Description BLOOD LEFT HAND   Final    Special Requests BOTTLES DRAWN AEROBIC ONLY 2 CC   Final    Culture  Setup Time [id number]   Final    Culture     Final    Value:        BLOOD CULTURE RECEIVED NO GROWTH TO DATE CULTURE WILL BE HELD FOR 5 DAYS BEFORE ISSUING Humphrey FINAL NEGATIVE REPORT   Report Status PENDING   Incomplete   CULTURE, BLOOD (ROUTINE X 2)     Status: Normal (Preliminary result)   Collection Time   01/12/12  2:15 AM      Component Value Range Status Comment   Specimen Description BLOOD LEFT FINGER   Final    Special Requests BOTTLES DRAWN AEROBIC ONLY 5 CC   Final    Culture  Setup Time [id number]   Final    Culture     Final    Value:        BLOOD CULTURE RECEIVED NO GROWTH TO DATE CULTURE WILL BE HELD FOR 5 DAYS BEFORE ISSUING Humphrey FINAL  NEGATIVE REPORT   Report Status PENDING   Incomplete   MRSA PCR SCREENING     Status: Abnormal   Collection Time   01/12/12  4:03 AM      Component Value Range Status Comment   MRSA by PCR POSITIVE (*) NEGATIVE  Final   CLOSTRIDIUM DIFFICILE BY PCR     Status: Abnormal   Collection Time   01/13/12  7:50 AM      Component Value Range Status Comment   C difficile by pcr POSITIVE (*) NEGATIVE  Final     Studies/Results: No results found.  Medications: I have reviewed the patient's current medications. Scheduled Meds:    . cloNIDine  0.1 mg Transdermal Weekly  . enoxaparin  40 mg Subcutaneous Q24H  . lactulose  30 g Oral Daily  . mupirocin ointment  1 application Nasal BID  . PHENObarbital  160 mg Oral Daily  . phenytoin  200 mg Oral BID  . piperacillin-tazobactam (ZOSYN)  IV  3.375 g Intravenous  Q8H  . potassium chloride  10 mEq Intravenous Q1 Hr x 5  . potassium chloride  40 mEq Oral Once  . vancomycin  500 mg Oral Q6H  . DISCONTD: metronidazole  500 mg Intravenous Q8H   Continuous Infusions:    . dextrose 75 mL/hr at 01/17/12 0758   PRN Meds:.albuterol, ipratropium  Assessment/Plan: Healthcare-associated pneumonia / Leukocytosis / Sepsis / C. difficile colitis. The patient was admitted and blood cultures show no growth to date.  Patient has multiple infectious sources including his urinary tract, pneumonia and clostridium difficile colitis.  Levofloxacin on February 18 of 2013, discontinued.  Vancomycin 2/18 - 01/14/2012.  Continue Zosyn since 01/12/2012, define Humphrey total of 7 day course of antibiotics.  Metronidazole 2/19 - 01/16/2012.  Continue oral vancomycin for C. difficile colitis, antibiotic since 01/13/2012.  Oxygen saturations are stable.  Leukocytosis improving. Speech therapy evaluation done is recommending dysphagia 3 diet with thin liquids with full supervision.    ARF (acute renal failure) / Nephrolithiasis /Hydronephrosis / Hypernatremia. Status post urological consultation.  Humphrey percutaneous nephrostomy tube done on 01/13/2012. Will need outpatient lithotripsy when stable.  Hypernatremia likely due to acute renal failure resolved. Continue to monitor carefully, continue D5W to decrease the rate to 75 cc per hour.  Once able to tolerate oral intake will decrease D5W.  Hypernatremia is likely due to poor by mouth intake.  Epilepsy  The patient is being maintained on his home doses of dilantin and phenobarbital. No seizure events noted in the hospital.   Mental retardation  The patient understands "yes or no" but no other communication. He does not talk and stopped ambulating ~ 6 yrs ago. He does not sit up on his own. He usually has Humphrey good appetite and eats regular food.   Clostridium difficile colitis  Known history of ulcerative colitis. No evidence of obstruction or  ileus. Stool for clostridium difficile by PCR noted to be positive.  Continue oral Vancomycin.  Has not had any further episodes of diarrhea.  Hyperglycemia  Hemoglobin A1c is normal at 5.5%.  Prophylaxis Lovenox  Disposition Pending.   LOS: 6 days  Charles Scarlata A, MD 01/17/2012, 3:26 PM

## 2012-01-18 LAB — BASIC METABOLIC PANEL
BUN: 5 mg/dL — ABNORMAL LOW (ref 6–23)
BUN: 6 mg/dL (ref 6–23)
Calcium: 7.2 mg/dL — ABNORMAL LOW (ref 8.4–10.5)
Calcium: 7.2 mg/dL — ABNORMAL LOW (ref 8.4–10.5)
Chloride: 107 mEq/L (ref 96–112)
Chloride: 102 mEq/L (ref 96–112)
Creatinine, Ser: 0.7 mg/dL (ref 0.50–1.35)
Creatinine, Ser: 0.78 mg/dL (ref 0.50–1.35)
GFR calc Af Amer: >90 mL/min (ref >90)
GFR calc Af Amer: >90 mL/min (ref >90)

## 2012-01-18 LAB — MAGNESIUM: Magnesium: 1.6 mg/dL (ref 1.5–2.5)

## 2012-01-18 LAB — CULTURE, BLOOD (ROUTINE X 2)
Culture  Setup Time: 201302180818
Culture: NO GROWTH

## 2012-01-18 MED ORDER — POTASSIUM CHLORIDE CRYS ER 20 MEQ PO TBCR
40.0000 meq | EXTENDED_RELEASE_TABLET | Freq: Once | ORAL | Status: AC
  Administered 2012-01-18: 40 meq via ORAL
  Filled 2012-01-18: qty 2

## 2012-01-18 MED ORDER — POTASSIUM CHLORIDE 10 MEQ/100ML IV SOLN
10.0000 meq | INTRAVENOUS | Status: AC
  Administered 2012-01-18 (×3): 10 meq via INTRAVENOUS
  Filled 2012-01-18 (×4): qty 100

## 2012-01-18 MED ORDER — MAGNESIUM SULFATE IN D5W 10-5 MG/ML-% IV SOLN
1.0000 g | Freq: Once | INTRAVENOUS | Status: AC
  Administered 2012-01-18: 1 g via INTRAVENOUS
  Filled 2012-01-18: qty 100

## 2012-01-18 MED ORDER — POTASSIUM CHLORIDE 10 MEQ/100ML IV SOLN
10.0000 meq | INTRAVENOUS | Status: AC
  Administered 2012-01-18 (×4): 10 meq via INTRAVENOUS
  Filled 2012-01-18 (×4): qty 100

## 2012-01-18 NOTE — Progress Notes (Signed)
Subjective: Patient still nonverbal.  No family at the bedside.  Objective: Vital signs in last 24 hours: Filed Vitals:   01/16/12 2150 01/17/12 0645 01/17/12 1300 01/18/12 0506  BP: 128/82 130/80 130/87 112/75  Pulse: 90 92 97 97  Temp: 98.2 F (36.8 C) 98 F (36.7 C) 98.1 F (36.7 C) 97.9 F (36.6 C)  TempSrc: Axillary Oral  Axillary  Resp: 16 18 16 17   Height:      Weight:      SpO2: 93% 94% 95% 91%   Weight change:   Intake/Output Summary (Last 24 hours) at 01/18/12 1443 Last data filed at 01/18/12 0609  Gross per 24 hour  Intake  270.3 ml  Output    700 ml  Net -429.7 ml    Physical Exam: General: Awake, No acute distress, nonverbal. HEENT: EOMI. Neck: Supple CV: S1 and S2 Lungs: Clear to ascultation bilaterally Abdomen: Soft, Nontender, Nondistended, +bowel sounds. Ext: Good pulses. Trace edema. Back: Percutaneous drain in place.  Lab Results:  Basename 01/18/12 0745 01/17/12 1641 01/16/12 0338  NA 145 140 --  K 2.1* 3.1* --  CL 107 106 --  CO2 30 28 --  GLUCOSE 137* 124* --  BUN 5* 5* --  CREATININE 0.70 0.64 --  CALCIUM 7.2* 7.0* --  MG 1.6 -- 1.9  PHOS -- -- --   No results found for this basename: AST:2,ALT:2,ALKPHOS:2,BILITOT:2,PROT:2,ALBUMIN:2 in the last 72 hours No results found for this basename: LIPASE:2,AMYLASE:2 in the last 72 hours  Basename 01/17/12 0425 01/16/12 0338  WBC 6.8 7.9  NEUTROABS -- --  HGB 12.3* 13.9  HCT 37.9* 42.5  MCV 93.3 94.2  PLT 297 272   No results found for this basename: CKTOTAL:3,CKMB:3,CKMBINDEX:3,TROPONINI:3 in the last 72 hours No components found with this basename: POCBNP:3 No results found for this basename: DDIMER:2 in the last 72 hours No results found for this basename: HGBA1C:2 in the last 72 hours No results found for this basename: CHOL:2,HDL:2,LDLCALC:2,TRIG:2,CHOLHDL:2,LDLDIRECT:2 in the last 72 hours No results found for this basename: TSH,T4TOTAL,FREET3,T3FREE,THYROIDAB in the last 72  hours No results found for this basename: VITAMINB12:2,FOLATE:2,FERRITIN:2,TIBC:2,IRON:2,RETICCTPCT:2 in the last 72 hours  Micro Results: Recent Results (from the past 240 hour(s))  CULTURE, BLOOD (ROUTINE X 2)     Status: Normal   Collection Time   01/12/12 12:00 AM      Component Value Range Status Comment   Specimen Description BLOOD LEFT HAND   Final    Special Requests BOTTLES DRAWN AEROBIC ONLY 2 CC   Final    Culture  Setup Time [id number]   Final    Culture NO GROWTH 5 DAYS   Final    Report Status 01/18/2012 FINAL   Final   CULTURE, BLOOD (ROUTINE X 2)     Status: Normal   Collection Time   01/12/12  2:15 AM      Component Value Range Status Comment   Specimen Description BLOOD LEFT FINGER   Final    Special Requests BOTTLES DRAWN AEROBIC ONLY 5 CC   Final    Culture  Setup Time [id number]   Final    Culture NO GROWTH 5 DAYS   Final    Report Status 01/18/2012 FINAL   Final   MRSA PCR SCREENING     Status: Abnormal   Collection Time   01/12/12  4:03 AM      Component Value Range Status Comment   MRSA by PCR POSITIVE (*) NEGATIVE  Final   CLOSTRIDIUM  DIFFICILE BY PCR     Status: Abnormal   Collection Time   01/13/12  7:50 AM      Component Value Range Status Comment   C difficile by pcr POSITIVE (*) NEGATIVE  Final     Studies/Results: No results found.  Medications: I have reviewed the patient's current medications. Scheduled Meds:    . cloNIDine  0.1 mg Transdermal Weekly  . enoxaparin  40 mg Subcutaneous Q24H  . lactulose  30 g Oral Daily  . PHENObarbital  160 mg Oral Daily  . phenytoin  200 mg Oral BID  . piperacillin-tazobactam (ZOSYN)  IV  3.375 g Intravenous Q8H  . potassium chloride  10 mEq Intravenous Q1 Hr x 5  . potassium chloride  10 mEq Intravenous Q1 Hr x 4  . potassium chloride  40 mEq Oral Once  . potassium chloride  40 mEq Oral Once  . vancomycin  500 mg Oral Q6H   Continuous Infusions:    . dextrose 75 mL/hr at 01/18/12 0609   PRN  Meds:.albuterol, ipratropium  Assessment/Plan: Healthcare-associated pneumonia / Leukocytosis / Sepsis / C. difficile colitis. The patient was admitted and blood cultures show no growth to date.  Patient has multiple infectious sources including his urinary tract, pneumonia and clostridium difficile colitis.  Levofloxacin on February 18 of 2013, discontinued.  Vancomycin 2/18 - 01/14/2012.  Zosyn since 01/12/2012 will discontinue after today's dose to define a seven-day course of antibiotics.  Metronidazole 2/19 - 01/16/2012, for C. differential colitis.  Continue oral vancomycin for C. difficile colitis, antibiotic since 01/13/2012, define a total of 14 day course.  Oxygen saturations are stable.  Leukocytosis improving. Speech therapy evaluation done is recommending dysphagia 3 diet with thin liquids with full supervision.    ARF (acute renal failure) / Nephrolithiasis /Hydronephrosis / Hypernatremia. Status post urological consultation.  A percutaneous nephrostomy tube done on 01/13/2012. Will need outpatient lithotripsy when stable.  Hypernatremia likely due to acute renal failure resolved. Continue to monitor carefully, continue D5W at 75 cc per hour.  Once able to tolerate oral intake will decrease D5W.  Hypernatremia is likely due to poor by mouth intake.  Severe hypokalemia Replace as needed.  Magnesium level low normal will give the patient magnesium sulfate 1 g x1.  Continue to replete aggressively.  Epilepsy  The patient is being maintained on his home doses of dilantin and phenobarbital. No seizure events noted in the hospital.   Mental retardation  The patient understands "yes or no" but no other communication. He does not talk and stopped ambulating ~ 6 yrs ago. He does not sit up on his own. He usually has a good appetite and eats regular food.   Clostridium difficile colitis  Known history of ulcerative colitis. No evidence of obstruction or ileus. Stool for clostridium difficile by  PCR noted to be positive.  Continue oral Vancomycin as indicated above.  Has not had any further episodes of diarrhea.  Hyperglycemia  Hemoglobin A1c is normal at 5.5%.  Prophylaxis Lovenox  Disposition Pending, improvement in oral intake.   LOS: 7 days  Dellis Voght A, MD 01/18/2012, 2:43 PM

## 2012-01-18 NOTE — Progress Notes (Signed)
CRITICAL VALUE ALERT  Critical value received:  Potassium   Date of notification: 01/18/12  Time of notification:  1535  Critical value read back:yes  Nurse who received alert:  Philomena Doheny RN  MD notified (1st page):  Betti Cruz  Time of first page:  1545  MD notified (2nd page):  Time of second page:  Responding MD:  Betti Cruz  Time MD responded:  [phone]

## 2012-01-18 NOTE — Progress Notes (Signed)
CRITICAL VALUE ALERT  Critical value received:  Potassium 2.1  Date of notification: 01/18/12  Time of notification:  0835  Critical value read back:yes  Nurse who received alert:  Philomena Doheny RN  MD notified (1st page):  Betti Cruz  Time of first page: 0845  MD notified (2nd page):  Time of second page:  Responding MD: Betti Cruz  Time MD responded:  [phone]

## 2012-01-19 ENCOUNTER — Inpatient Hospital Stay (HOSPITAL_COMMUNITY)
Admit: 2012-01-19 | Discharge: 2012-01-19 | Disposition: A | Discharge status: Home / Self Care | Payer: Medicare Other | Attending: Internal Medicine | Admitting: Internal Medicine

## 2012-01-19 LAB — BASIC METABOLIC PANEL
BUN: 4 mg/dL — ABNORMAL LOW (ref 6–23)
Creatinine, Ser: 0.72 mg/dL (ref 0.50–1.35)
GFR calc Af Amer: >90 mL/min (ref >90)
GFR calc non Af Amer: >90 mL/min (ref >90)
Potassium: 3.1 mEq/L — ABNORMAL LOW (ref 3.5–5.1)

## 2012-01-19 LAB — CBC
HCT: 39.5 % (ref 39.0–52.0)
MCHC: 32.9 g/dL (ref 30.0–36.0)
MCV: 92.5 fL (ref 78.0–100.0)
Platelets: 360 10*3/uL (ref 150–400)
RDW: 16.4 % — ABNORMAL HIGH (ref 11.5–15.5)

## 2012-01-19 LAB — MAGNESIUM: Magnesium: 2 mg/dL (ref 1.5–2.5)

## 2012-01-19 MED ORDER — POTASSIUM CHLORIDE CRYS ER 20 MEQ PO TBCR
40.0000 meq | EXTENDED_RELEASE_TABLET | Freq: Once | ORAL | Status: AC
  Administered 2012-01-19: 40 meq via ORAL
  Filled 2012-01-19 (×2): qty 2

## 2012-01-19 MED ORDER — CEFAZOLIN SODIUM 1-5 GM-% IV SOLN: 1.0000 g | INTRAVENOUS | Status: DC

## 2012-01-19 MED ORDER — PHENOBARBITAL 97.2 MG PO TABS
97.2000 mg | ORAL_TABLET | Freq: Every day | ORAL | Status: DC
  Administered 2012-01-19: 97.2 mg via ORAL
  Filled 2012-01-19: qty 1

## 2012-01-19 MED ORDER — PHENOBARBITAL 32.4 MG PO TABS
64.8000 mg | ORAL_TABLET | Freq: Two times a day (BID) | ORAL | Status: DC
  Administered 2012-01-19: 64.8 mg via ORAL

## 2012-01-19 MED ORDER — CIPROFLOXACIN IN D5W 400 MG/200ML IV SOLN: 400.0000 mg | INTRAVENOUS | Status: DC

## 2012-01-19 MED ORDER — PHENOBARBITAL 30 MG PO TABS: 60.0000 mg | ORAL_TABLET | Freq: Two times a day (BID) | ORAL | Status: DC

## 2012-01-19 MED ORDER — PHENOBARBITAL 64.8 MG PO TABS
64.8000 mg | ORAL_TABLET | Freq: Every day | ORAL | Status: DC
  Filled 2012-01-19: qty 1

## 2012-01-19 MED ORDER — PHENOBARBITAL 97.2 MG PO TABS
97.2000 mg | ORAL_TABLET | Freq: Every day | ORAL | Status: DC
  Administered 2012-01-19 – 2012-01-20 (×2): 97.2 mg via ORAL
  Filled 2012-01-19: qty 1

## 2012-01-19 MED ORDER — POTASSIUM CHLORIDE CRYS ER 20 MEQ PO TBCR
60.0000 meq | EXTENDED_RELEASE_TABLET | Freq: Once | ORAL | Status: AC
  Administered 2012-01-19: 60 meq via ORAL
  Filled 2012-01-19: qty 3

## 2012-01-19 MED ORDER — PHENOBARBITAL 100 MG PO TABS: 100.0000 mg | ORAL_TABLET | Freq: Every day | ORAL | Status: DC

## 2012-01-19 MED ORDER — PHENOBARBITAL 97.2 MG PO TABS: 97.2000 mg | ORAL_TABLET | Freq: Every day | ORAL | Status: DC

## 2012-01-19 MED ORDER — ENSURE CLINICAL ST REVIGOR PO LIQD
237.0000 mL | Freq: Three times a day (TID) | ORAL | Status: DC
  Administered 2012-01-19: 18:00:00 via ORAL
  Administered 2012-01-20: 237 mL via ORAL

## 2012-01-19 MED ORDER — PHENOBARBITAL 32.4 MG PO TABS
64.8000 mg | ORAL_TABLET | Freq: Every day | ORAL | Status: DC
  Administered 2012-01-19 – 2012-01-20 (×2): 64.8 mg via ORAL
  Filled 2012-01-19: qty 2

## 2012-01-19 NOTE — Procedures (Signed)
EEG NUMBER:  HISTORY:  This is 56 year old male with altered mental status and history of mental retardation.  MEDICATIONS:  K-Dur, Catapres, Luminal, Lovenox, Chronulac, Dilantin, vancomycin.  CONDITIONS OF RECORDING:  This is a 16 channel EEG carried out with the patient in the unresponsive state.  DESCRIPTION:  Background activity is poorly organized and slow with the underlying polymorphic delta rhythm noted.  Superimposed symmetrical sleep spindles and vertex central sharp transients, thus suggests stage II sleep.  Also noted though are some sharp activity that does not appear to be related to sleep.  At times, this sharp activity is seen over the right hemisphere.  There are rare periods during the tracing, but this is seen over the left hemisphere as well.  There is no continuous epileptiform activity to suggest electrographic seizures. Hypoventilation and intermittent photic stimulation were not performed. The patient did show evidence of reactivity with painful stimulation.  IMPRESSION:  This is an abnormal EEG secondary to independent left and right hemispheric sharp activity that is suggestive of possible seizures disorder.  No electrographic seizures were noted.          ______________________________ Thana Farr, MD    [id number] D:  01/19/2012 19:58:55  T:  01/19/2012 22:26:39  Job #:  [id number]

## 2012-01-19 NOTE — Progress Notes (Signed)
Subjective: Patient nonverbal.  No family at the bedside.  Per nursing appetite has improved and is now able to eat oral intake at this time.  Objective: Vital signs in last 24 hours: Filed Vitals:   01/18/12 1500 01/18/12 2046 01/19/12 0410 01/19/12 1429  BP: 121/81 131/87 119/75 122/74  Pulse: 81 106 103 98  Temp: 98 F (36.7 C) 97.5 F (36.4 C) 97.7 F (36.5 C) 97.5 F (36.4 C)  TempSrc:  Oral Axillary Axillary  Resp: 16 18 18 18   Height:      Weight:      SpO2: 93% 92% 94% 95%   Weight change:   Intake/Output Summary (Last 24 hours) at 01/19/12 1658 Last data filed at 01/19/12 1300  Gross per 24 hour  Intake 2067.6 ml  Output   3200 ml  Net -1132.4 ml    Physical Exam: General: Awake, No acute distress, nonverbal. HEENT: EOMI. Neck: Supple CV: S1 and S2 Lungs: Clear to ascultation bilaterally Abdomen: Soft, Nontender, Nondistended, +bowel sounds. Ext: Good pulses. Trace edema. Back: Percutaneous drain in place.  Lab Results:  Basename 01/19/12 0500 01/18/12 1430 01/18/12 0745  NA 145 140 --  K 3.1* 2.3* --  CL 108 102 --  CO2 33* 32 --  GLUCOSE 151* 197* --  BUN 4* 6 --  CREATININE 0.72 0.78 --  CALCIUM 7.8* 7.2* --  MG 2.0 -- 1.6  PHOS -- -- --   No results found for this basename: AST:2,ALT:2,ALKPHOS:2,BILITOT:2,PROT:2,ALBUMIN:2 in the last 72 hours No results found for this basename: LIPASE:2,AMYLASE:2 in the last 72 hours  Basename 01/19/12 0500 01/17/12 0425  WBC 7.8 6.8  NEUTROABS -- --  HGB 13.0 12.3*  HCT 39.5 37.9*  MCV 92.5 93.3  PLT 360 297   No results found for this basename: CKTOTAL:3,CKMB:3,CKMBINDEX:3,TROPONINI:3 in the last 72 hours No components found with this basename: POCBNP:3 No results found for this basename: DDIMER:2 in the last 72 hours No results found for this basename: HGBA1C:2 in the last 72 hours No results found for this basename: CHOL:2,HDL:2,LDLCALC:2,TRIG:2,CHOLHDL:2,LDLDIRECT:2 in the last 72 hours No  results found for this basename: TSH,T4TOTAL,FREET3,T3FREE,THYROIDAB in the last 72 hours No results found for this basename: VITAMINB12:2,FOLATE:2,FERRITIN:2,TIBC:2,IRON:2,RETICCTPCT:2 in the last 72 hours  Micro Results: Recent Results (from the past 240 hour(s))  CULTURE, BLOOD (ROUTINE X 2)     Status: Normal   Collection Time   01/12/12 12:00 AM      Component Value Range Status Comment   Specimen Description BLOOD LEFT HAND   Final    Special Requests BOTTLES DRAWN AEROBIC ONLY 2 CC   Final    Culture  Setup Time [id number]   Final    Culture NO GROWTH 5 DAYS   Final    Report Status 01/18/2012 FINAL   Final   CULTURE, BLOOD (ROUTINE X 2)     Status: Normal   Collection Time   01/12/12  2:15 AM      Component Value Range Status Comment   Specimen Description BLOOD LEFT FINGER   Final    Special Requests BOTTLES DRAWN AEROBIC ONLY 5 CC   Final    Culture  Setup Time [id number]   Final    Culture NO GROWTH 5 DAYS   Final    Report Status 01/18/2012 FINAL   Final   MRSA PCR SCREENING     Status: Abnormal   Collection Time   01/12/12  4:03 AM      Component Value Range Status  Comment   MRSA by PCR POSITIVE (*) NEGATIVE  Final   CLOSTRIDIUM DIFFICILE BY PCR     Status: Abnormal   Collection Time   01/13/12  7:50 AM      Component Value Range Status Comment   C difficile by pcr POSITIVE (*) NEGATIVE  Final   OVA AND PARASITE EXAMINATION     Status: Normal   Collection Time   01/16/12  9:02 AM      Component Value Range Status Comment   Specimen Description STOOL   Final    Special Requests NONE   Final    Ova and parasites NO OVA OR PARASITES SEEN   Final    Report Status 01/19/2012 FINAL   Final     Studies/Results: No results found.  Medications: I have reviewed the patient's current medications. Scheduled Meds:    . cloNIDine  0.1 mg Transdermal Weekly  . enoxaparin  40 mg Subcutaneous Q24H  . feeding supplement  237 mL Oral TID WC  . lactulose  30 g Oral Daily    . PHENobarbital  64.8 mg Oral Daily   And  . PHENObarbital  97.2 mg Oral Daily  . phenytoin  200 mg Oral BID  . potassium chloride  10 mEq Intravenous Q1 Hr x 4  . potassium chloride  10 mEq Intravenous Q1 Hr x 4  . potassium chloride  40 mEq Oral Once  . potassium chloride  40 mEq Oral Once  . potassium chloride  60 mEq Oral Once  . vancomycin  500 mg Oral Q6H  . DISCONTD:  ceFAZolin (ANCEF) IV  1 g Intravenous 30 min Pre-Op  . DISCONTD: ciprofloxacin  400 mg Intravenous 60 min Pre-Op  . DISCONTD: PHENObarbital  100 mg Oral Daily  . DISCONTD: PHENObarbital  60 mg Oral BID  . DISCONTD: PHENObarbital  64.8 mg Oral BID  . DISCONTD: PHENobarbital  64.8 mg Oral Daily  . DISCONTD: PHENObarbital  97.2 mg Oral Daily  . DISCONTD: PHENObarbital  97.2 mg Oral Daily  . DISCONTD: PHENObarbital  160 mg Oral Daily   Continuous Infusions:    . dextrose 75 mL (01/19/12 0920)   PRN Meds:.albuterol, ipratropium  Assessment/Plan: Healthcare-associated pneumonia / Leukocytosis / Sepsis / C. difficile colitis. The patient was admitted and blood cultures show no growth to date.  Patient has multiple infectious sources including his urinary tract, pneumonia and clostridium difficile colitis.  Levofloxacin on February 18 of 2013, discontinued.  Vancomycin 2/18 - 01/14/2012.  Zosyn from 01/12/2012 - 01/18/2012 to complete a seven-day course of antibiotics.  Metronidazole 2/19 - 01/16/2012, for C. differential colitis.  Continue oral vancomycin for C. difficile colitis, antibiotic since 01/13/2012, define a total of 14 day course.  Oxygen saturations are stable.  Leukocytosis improving. Speech therapy recommending dysphagia 3 diet with thin liquids with full supervision.    ARF (acute renal failure) / Nephrolithiasis /Hydronephrosis / Hypernatremia. Status post urological consultation.  A percutaneous nephrostomy tube done on 01/13/2012. Will need outpatient lithotripsy when stable.  Hypernatremia likely due  to acute renal failure resolved. Continue to monitor carefully, decreased D5W to [facility], as patient's oral intake improved.  Hypernatremia is likely due to poor by mouth intake.  Severe hypokalemia Replace as needed.  Magnesium level low normal was given dose of magnesium sulfate 1 g IV on 01/18/2012.  Continue to replete aggressively.  Epilepsy  The patient is being maintained on his home doses of dilantin and phenobarbital. No seizure events noted in the  hospital.  Given patient's slow improvement mentation will get the EEG for better evaluation to make sure the patient is not having ongoing seizures triggered by healthcare associated pneumonia/leukocytosis/sepsis/C. difficile colitis.  Mental retardation  The patient understands "yes or no" but no other communication. He does not talk and stopped ambulating ~ 6 yrs ago. He does not sit up on his own. He usually has a good appetite and eats regular food, however has had a decreased oral intake which is slowly started to improve.  Clostridium difficile colitis  Known history of ulcerative colitis. No evidence of obstruction or ileus. Stool for clostridium difficile by PCR noted to be positive.  Continue oral Vancomycin as indicated above.  Has not had any further episodes of diarrhea.  Hyperglycemia  Hemoglobin A1c is normal at 5.5%.  Prophylaxis Lovenox  Disposition Pending, improvement in oral intake.   LOS: 8 days  Baylor Cortez A, MD 01/19/2012, 4:58 PM

## 2012-01-19 NOTE — Progress Notes (Signed)
Nutrition Follow-up  Diet Order: Dysphagia 3, thin  Calorie Count Results 2/24 Breakfast - 228 calories, 10g protein Lunch - 440 calories, 30g protein Dinner - Minimal calories/protein from few bites of food  2/25  Breakfast - 0% intake documented Lunch - 355 calories, 6g protein  - Attempted to meet with pt and family, however no family present and EEG being performed in room. RN reports pt ate better today with family assistance, 75% of lunch. Average intake of calories/protein per meal has been 341 calories, 15g protein for an estimated daily average of 1023 calories, 45g protein - meeting 57% calorie needs, 40% protein needs. RN had mentioned possible TF for nutrition, awaiting MD to discuss with family. Hopefully this decline in intake is just temporary since pt eating well PTA, and family assistance with meals will promote increased meal intake. Will send supplements for additional nutrition.    Meds: Scheduled Meds:   . cloNIDine  0.1 mg Transdermal Weekly  . enoxaparin  40 mg Subcutaneous Q24H  . lactulose  30 g Oral Daily  . magnesium sulfate 1 - 4 g bolus IVPB  1 g Intravenous Once  . PHENobarbital  64.8 mg Oral Daily   And  . PHENObarbital  97.2 mg Oral Daily  . phenytoin  200 mg Oral BID  . potassium chloride  10 mEq Intravenous Q1 Hr x 4  . potassium chloride  10 mEq Intravenous Q1 Hr x 4  . potassium chloride  40 mEq Oral Once  . potassium chloride  40 mEq Oral Once  . potassium chloride  60 mEq Oral Once  . vancomycin  500 mg Oral Q6H  . DISCONTD: PHENObarbital  100 mg Oral Daily  . DISCONTD: PHENObarbital  60 mg Oral BID  . DISCONTD: PHENObarbital  64.8 mg Oral BID  . DISCONTD: PHENobarbital  64.8 mg Oral Daily  . DISCONTD: PHENObarbital  97.2 mg Oral Daily  . DISCONTD: PHENObarbital  97.2 mg Oral Daily  . DISCONTD: PHENObarbital  160 mg Oral Daily   Continuous Infusions:   . dextrose 75 mL (01/19/12 0920)   PRN Meds:.albuterol, ipratropium  Labs:   CMP     Component Value Date/Time   NA 145 01/19/2012 0500   K 3.1* 01/19/2012 0500   CL 108 01/19/2012 0500   CO2 33* 01/19/2012 0500   GLUCOSE 151* 01/19/2012 0500   BUN 4* 01/19/2012 0500   CREATININE 0.72 01/19/2012 0500   CALCIUM 7.8* 01/19/2012 0500   PROT 7.6 01/11/2012 2319   ALBUMIN 2.6* 01/11/2012 2319   AST 11 01/11/2012 2319   ALT 10 01/11/2012 2319   ALKPHOS 154* 01/11/2012 2319   BILITOT 0.5 01/11/2012 2319   GFRNONAA >90 01/19/2012 0500   GFRAA >90 01/19/2012 0500     Intake/Output Summary (Last 24 hours) at 01/19/12 1450 Last data filed at 01/19/12 1300  Gross per 24 hour  Intake 2307.6 ml  Output   3900 ml  Net -1592.4 ml   Last BM - 01/18/12  Weight Status: No new weights  Nutrition Dx: Inadequate oral intake - ongoing  Goal:  1. Diet advancement back to regular - met 2. PO intake to meet >90% of estimated nutritional needs - not met.   Intervention: Ensure Clinical Strength TID. Family and nursing to continue to encourage increased meal intake.   Monitor: Weights, labs, intake    Marshall Cork Pager #: [phone]

## 2012-01-19 NOTE — Progress Notes (Signed)
Subjective: Patient with occasional cough, otherwise ok per family  Objective: Vital signs in last 24 hours: Temp:  [97.5 F (36.4 C)-98 F (36.7 C)] 97.7 F (36.5 C) (02/25 0410) Pulse Rate:  [81-106] 103  (02/25 0410) Resp:  [16-18] 18  (02/25 0410) BP: (119-131)/(75-87) 119/75 mmHg (02/25 0410) SpO2:  [92 %-94 %] 94 % (02/25 0410) Last BM Date: 01/18/12  Intake/Output from previous day: 02/24 0701 - 02/25 0700 In: 2067.6 [P.O.:240; I.V.:1390.4; IV Piggyback:437.2] Out: 3000 [Urine:3000] Intake/Output this shift:    Left PCN intact, output 800+ cc's clear yellow urine today, yellow urine in Foley  Lab Results:   Basename 01/19/12 0500 01/17/12 0425  WBC 7.8 6.8  HGB 13.0 12.3*  HCT 39.5 37.9*  PLT 360 297   BMET  Basename 01/19/12 0500 01/18/12 1430  NA 145 140  K 3.1* 2.3*  CL 108 102  CO2 33* 32  GLUCOSE 151* 197*  BUN 4* 6  CREATININE 0.72 0.78  CALCIUM 7.8* 7.2*   PT/INR No results found for this basename: LABPROT:2,INR:2 in the last 72 hours ABG No results found for this basename: PHART:2,PCO2:2,PO2:2,HCO3:2 in the last 72 hours  Studies/Results: No results found. Results for orders placed during the hospital encounter of 01/11/12  CULTURE, BLOOD (ROUTINE X 2)     Status: Normal   Collection Time   01/12/12 12:00 AM      Component Value Range Status Comment   Specimen Description BLOOD LEFT HAND   Final    Special Requests BOTTLES DRAWN AEROBIC ONLY 2 CC   Final    Culture  Setup Time [id number]   Final    Culture NO GROWTH 5 DAYS   Final    Report Status 01/18/2012 FINAL   Final   CULTURE, BLOOD (ROUTINE X 2)     Status: Normal   Collection Time   01/12/12  2:15 AM      Component Value Range Status Comment   Specimen Description BLOOD LEFT FINGER   Final    Special Requests BOTTLES DRAWN AEROBIC ONLY 5 CC   Final    Culture  Setup Time [id number]   Final    Culture NO GROWTH 5 DAYS   Final    Report Status 01/18/2012 FINAL   Final     MRSA PCR SCREENING     Status: Abnormal   Collection Time   01/12/12  4:03 AM      Component Value Range Status Comment   MRSA by PCR POSITIVE (*) NEGATIVE  Final   CLOSTRIDIUM DIFFICILE BY PCR     Status: Abnormal   Collection Time   01/13/12  7:50 AM      Component Value Range Status Comment   C difficile by pcr POSITIVE (*) NEGATIVE  Final     Anti-infectives: Anti-infectives     Start     Dose/Rate Route Frequency Ordered Stop   01/13/12 1800   vancomycin (VANCOCIN) 50 mg/mL oral solution 500 mg        500 mg Oral 4 times per day 01/13/12 1455 01/27/12 1759   01/13/12 1500   metroNIDAZOLE (FLAGYL) IVPB 500 mg  Status:  Discontinued        500 mg 100 mL/hr over 60 Minutes Intravenous Every 8 hours 01/13/12 1455 01/16/12 1553   01/13/12 1300   metroNIDAZOLE (FLAGYL) IVPB 500 mg  Status:  Discontinued        500 mg 100 mL/hr over 60 Minutes Intravenous Every 8 hours 01/13/12  1217 01/13/12 1455   01/13/12 0500   Levofloxacin (LEVAQUIN) IVPB 750 mg  Status:  Discontinued        750 mg 100 mL/hr over 90 Minutes Intravenous Every 24 hours 01/12/12 0450 01/12/12 0600   01/12/12 1200   vancomycin (VANCOCIN) 750 mg in sodium chloride 0.9 % 150 mL IVPB  Status:  Discontinued        750 mg 150 mL/hr over 60 Minutes Intravenous Every 12 hours 01/12/12 0450 01/14/12 1625   01/12/12 0800   piperacillin-tazobactam (ZOSYN) IVPB 3.375 g  Status:  Discontinued        3.375 g 12.5 mL/hr over 240 Minutes Intravenous Every 8 hours 01/12/12 0450 01/18/12 1449   01/12/12 0400   Levofloxacin (LEVAQUIN) IVPB 750 mg        750 mg 100 mL/hr over 90 Minutes Intravenous  Once 01/12/12 0334 01/12/12 0552   01/12/12 0015   vancomycin (VANCOCIN) IVPB 1000 mg/200 mL premix        1,000 mg 200 mL/hr over 60 Minutes Intravenous  Once 01/11/12 2350 01/12/12 0107   01/12/12 0015   piperacillin-tazobactam (ZOSYN) IVPB 3.375 g        3.375 g 12.5 mL/hr over 240 Minutes Intravenous  Once 01/11/12 2350  01/12/12 0407          Assessment/Plan: s/p left percutaneous nephrostomy 2/19; pt tentatively scheduled for nephrolithotomy by urology on 3/14 per family; continue current tx   LOS: 8 days    Tywone Bembenek,D [facility] 01/19/2012

## 2012-01-19 NOTE — Progress Notes (Signed)
   CARE MANAGEMENT NOTE 01/19/2012  Patient:  Charles Humphrey, Charles Humphrey   Account Number:  [id number]  Date Initiated:  01/12/2012  Documentation initiated by:  Jiles Crocker  Subjective/Objective Assessment:   ADMITTED WITH FEVER, HYPOXIA     Action/Plan:   PATIENT IS A RESIDENT OF A NURSING FACILITY; SOCIAL WORKER REFERRAL PLACED   Anticipated DC Date:  01/22/2012   Anticipated DC Plan:  SKILLED NURSING FACILITY  In-house referral  Clinical Social Worker         Choice offered to / List presented to:             Status of service:  In process, will continue to follow Medicare Important Message given?   (If response is "NO", the following Medicare IM given date fields will be blank) Date Medicare IM given:   Date Additional Medicare IM given:    Discharge Disposition:    Per UR Regulation:  Reviewed for med. necessity/level of care/duration of stay  Comments:  01/19/12 Tim Corriher RN,BSN NCM [phone] EEG DONE,NUTRITION FOLLOWING-ORAL INTAKE POOR.D/C PLAN RETURN SNF @ D/C.  01/15/12 Santanna Olenik RN,BSN NCM [phone] RENAL FOLLOWING-S/P PERC NEPHROSTOMY TUBE.HYPERNATREMIA.D/C PLAN RETURN SNF ONCE MED STABLE.CSW DILIGENTLY FOLLOWING. 01/12/2012 - Sofie Rower RN, BSN, MHA

## 2012-01-19 NOTE — Progress Notes (Signed)
EEG portable offsite, completed.

## 2012-01-19 NOTE — Plan of Care (Signed)
Problem: Inadequate Intake (NI-2.1) Goal: Food and/or nutrient delivery Individualized approach for food/nutrient provision.  Outcome: Not Progressing Meal intake varies from 0-75%, average meal intake meeting ~50% of nutritional needs

## 2012-01-20 LAB — BASIC METABOLIC PANEL
BUN: 5 mg/dL — ABNORMAL LOW (ref 6–23)
Chloride: 110 mEq/L (ref 96–112)
GFR calc Af Amer: >90 mL/min (ref >90)
GFR calc non Af Amer: >90 mL/min (ref >90)
Glucose, Bld: 125 mg/dL — ABNORMAL HIGH (ref 70–99)
Potassium: 3.9 mEq/L (ref 3.5–5.1)
Sodium: 147 mEq/L — ABNORMAL HIGH (ref 135–145)

## 2012-01-20 MED ORDER — VANCOMYCIN 50 MG/ML ORAL SOLUTION: 500.0000 mg | Freq: Four times a day (QID) | ORAL | Status: AC

## 2012-01-20 MED ORDER — CLONIDINE HCL 0.1 MG/24HR TD PTWK: 1.0000 | MEDICATED_PATCH | TRANSDERMAL | Status: DC

## 2012-01-20 MED ORDER — ENSURE CLINICAL ST REVIGOR PO LIQD: 237.0000 mL | Freq: Three times a day (TID) | ORAL | Status: DC

## 2012-01-20 NOTE — Discharge Summary (Signed)
Discharge Summary  Charles Humphrey MR#: [id number]  DOB:Apr 20, 1956  Date of Admission: 01/11/2012 Date of Discharge: 01/20/2012  Patient's PCP: Kaleen Mask, MD, MD  Attending Physician:Russell Quinney A  Consults: Treatment Team:  Anner Crete, MD, urology  Discharge Diagnoses: Principal Problem:  *Healthcare-associated pneumonia Active Problems:  Epilepsy  Mental retardation  ARF (acute renal failure)  Nephrolithiasis  Hydronephrosis  C. difficile colitis  Hyperglycemia  Leukocytosis  Sepsis  Hypernatremia  Hypokalemia  Acute renal failure  Brief Admitting History and Physical 56 year old gentleman who is a resident of [facility], has a history of mental retarded patient who presented on 01/12/2012 fever and hypoxia.  Discharge Medications Medication List  As of 01/20/2012  1:17 PM   STOP taking these medications         OVER THE COUNTER MEDICATION         TAKE these medications         albuterol (2.5 MG/3ML) 0.083% nebulizer solution   Commonly known as: PROVENTIL   Take 2.5 mg by nebulization every 6 (six) hours as needed.      cloNIDine 0.1 mg/24hr patch   Commonly known as: CATAPRES - Dosed in mg/24 hr   Place 1 patch (0.1 mg total) onto the skin once a week.      dextromethorphan 30 MG/5ML liquid   Commonly known as: DELSYM   Take 60 mg by mouth as needed.      feeding supplement Liqd   Take 237 mLs by mouth 3 (three) times daily with meals.      guaiFENesin 600 MG 12 hr tablet   Commonly known as: MUCINEX   Take 1,200 mg by mouth 2 (two) times daily.      lactulose 10 GM/15ML solution   Commonly known as: CHRONULAC   Take 30 g by mouth daily.      PHENObarbital 100 MG tablet   Commonly known as: LUMINAL   Take 100 mg by mouth daily. Takes along with a 60mg  tablet for a total of 160mg       PHENObarbital 60 MG tablet   Commonly known as: LUMINAL   Take 60 mg by mouth daily. Takes along with a 100mg  tablet for a total  dosing of 160mg       phenytoin 125 MG/5ML suspension   Commonly known as: DILANTIN   Take 200 mg by mouth 2 (two) times daily.      potassium chloride 10 MEQ tablet   Commonly known as: K-DUR   Take 20 mEq by mouth daily.      simethicone 40 MG/0.6ML drops   Commonly known as: MYLICON   Take 120 mg by mouth every 12 (twelve) hours.      vancomycin 50 mg/mL oral solution   Commonly known as: VANCOCIN   Take 10 mLs (500 mg total) by mouth every 6 (six) hours. Until 01/27/2012.            Hospital Course: Healthcare-associated pneumonia / Leukocytosis / Sepsis / C. difficile colitis. The patient was admitted and blood cultures show no growth to date.  Patient has multiple infectious sources including his urinary tract, pneumonia and clostridium difficile colitis.  Vancomycin IV from 2/18 - 01/14/2012.  Zosyn IV from 01/12/2012 - 01/18/2012 to complete a seven-day course of antibiotics.  Metronidazole 2/19 - 01/16/2012, for C. differential colitis.  Diarrhea improved with a course of hospital stay.  Respiratory status also improved during the course of hospital stay Continue oral vancomycin for C. difficile  colitis, antibiotic since 01/13/2012, define a total of 14 day course.  Oxygen saturations are stable.  Leukocytosis improving. Speech therapy recommending dysphagia 3 diet with thin liquids with full supervision.    ARF (acute renal failure) / Nephrolithiasis /Hydronephrosis / Hypernatremia. Status post urological consultation.  A percutaneous nephrostomy tube done on 01/13/2012. Will need outpatient lithotripsy when stable, family already given follow up appointment for urology.  Hypernatremia improved likely due to acute renal failure and poor by mouth intake was initially placed on D5W and as oral intake improved was weaned off D5W.  Severe hypokalemia Patient needed aggressive replacement during the course of hospital stay.  Given magnesium sulfate x1 on 01/18/2012.  Continue  potassium daily.  Requested electrolytes to be checked on February 28th 2013 or 01/23/2012 and have the results forwarded to primary care physician.    Epilepsy  The patient is being maintained on his home doses of dilantin and phenobarbital. No seizure events noted in the hospital.  EEG performed on February 25 of 2013 showed abnormal EEG secondary to independent left and right hemisphere sharp activity but is suggestive of possible seizure disorder, no electrographic seizures were noted.  EEG was done given patient had poor by mouth intake initially during the course of hospital stay.  Oral intake improved from 01/18/2012.  Mental retardation  The patient understands "yes or no" but no other communication. He does not talk and stopped ambulating ~ 6 yrs ago. He does not sit up on his own. He usually has a good appetite and eats regular food, however has had a decreased oral intake which is slowly started to improve.  Clostridium difficile colitis  Known history of ulcerative colitis. No evidence of obstruction or ileus. Stool for clostridium difficile by PCR noted to be positive.  Continue oral Vancomycin as indicated above.  Has not had any further episodes of diarrhea.  Hyperglycemia  Hemoglobin A1c is normal at 5.5%.  CODE STATUS  DO NOT RESUSCITATE as already established.  Day of Discharge BP 108/70  Pulse 114  Temp(Src) 96.7 F (35.9 C) (Axillary)  Resp 16  Ht 5\' 5"  (1.651 m)  Wt 90.719 kg (200 lb)  BMI 33.28 kg/m2  SpO2 93%  Results for orders placed during the hospital encounter of 01/11/12 (from the past 48 hour(s))  BASIC METABOLIC PANEL     Status: Abnormal   Collection Time   01/18/12  2:30 PM      Component Value Range Comment   Sodium 140  135 - 145 (mEq/L)    Potassium 2.3 (*) 3.5 - 5.1 (mEq/L)    Chloride 102  96 - 112 (mEq/L)    CO2 32  19 - 32 (mEq/L)    Glucose, Bld 197 (*) 70 - 99 (mg/dL)    BUN 6  6 - 23 (mg/dL)    Creatinine, Ser 4.78  0.50 - 1.35 (mg/dL)     Calcium 7.2 (*) 8.4 - 10.5 (mg/dL)    GFR calc non Af Amer >90  >90 (mL/min)    GFR calc Af Amer >90  >90 (mL/min)   CBC     Status: Abnormal   Collection Time   01/19/12  5:00 AM      Component Value Range Comment   WBC 7.8  4.0 - 10.5 (K/uL)    RBC 4.27  4.22 - 5.81 (MIL/uL)    Hemoglobin 13.0  13.0 - 17.0 (g/dL)    HCT 29.5  62.1 - 30.8 (%)    MCV  92.5  78.0 - 100.0 (fL)    MCH 30.4  26.0 - 34.0 (pg)    MCHC 32.9  30.0 - 36.0 (g/dL)    RDW 16.1 (*) 09.6 - 15.5 (%)    Platelets 360  150 - 400 (K/uL)   BASIC METABOLIC PANEL     Status: Abnormal   Collection Time   01/19/12  5:00 AM      Component Value Range Comment   Sodium 145  135 - 145 (mEq/L)    Potassium 3.1 (*) 3.5 - 5.1 (mEq/L)    Chloride 108  96 - 112 (mEq/L)    CO2 33 (*) 19 - 32 (mEq/L)    Glucose, Bld 151 (*) 70 - 99 (mg/dL)    BUN 4 (*) 6 - 23 (mg/dL)    Creatinine, Ser 0.45  0.50 - 1.35 (mg/dL)    Calcium 7.8 (*) 8.4 - 10.5 (mg/dL)    GFR calc non Af Amer >90  >90 (mL/min)    GFR calc Af Amer >90  >90 (mL/min)   MAGNESIUM     Status: Normal   Collection Time   01/19/12  5:00 AM      Component Value Range Comment   Magnesium 2.0  1.5 - 2.5 (mg/dL)   BASIC METABOLIC PANEL     Status: Abnormal   Collection Time   01/20/12  3:25 AM      Component Value Range Comment   Sodium 147 (*) 135 - 145 (mEq/L)    Potassium 3.9  3.5 - 5.1 (mEq/L)    Chloride 110  96 - 112 (mEq/L)    CO2 33 (*) 19 - 32 (mEq/L)    Glucose, Bld 125 (*) 70 - 99 (mg/dL)    BUN 5 (*) 6 - 23 (mg/dL)    Creatinine, Ser 4.09  0.50 - 1.35 (mg/dL)    Calcium 8.4  8.4 - 10.5 (mg/dL)    GFR calc non Af Amer >90  >90 (mL/min)    GFR calc Af Amer >90  >90 (mL/min)   MAGNESIUM     Status: Normal   Collection Time   01/20/12  3:25 AM      Component Value Range Comment   Magnesium 2.0  1.5 - 2.5 (mg/dL)     Ct Abdomen Pelvis W Contrast  01/12/2012  *RADIOLOGY REPORT*  Clinical Data: Fever; ileus or distal obstruction suspected on abdominal  radiograph.  Assess for ileus or obstruction.  CT ABDOMEN AND PELVIS WITH CONTRAST  Technique:  Multidetector CT imaging of the abdomen and pelvis was performed following the standard protocol during bolus administration of intravenous contrast.  Contrast: 80mL OMNIPAQUE IOHEXOL 300 MG/ML IV SOLN  Comparison: Abdominal radiograph performed 01/11/2012, and CT of the abdomen and pelvis performed 03/18/2010  Findings: There is partial consolidation of the right lower lobe; this raises concern for pneumonia, though significant atelectasis could have a similar appearance.  Scattered calcifications at the right lung base likely reflect calcified granulomata.  The liver and spleen are unremarkable in appearance.  The gallbladder is within normal limits.  The pancreas and adrenal glands are unremarkable.  There is a large 1.6 x 1.0 cm stone lodged at the left renal pelvis, with a 0.8 cm stone noted within a left renal calyx, and scattered smaller left renal stones also seen.  There is suggestion of minimal hydronephrosis, raising concern for some degree of obstruction.  Soft tissue inflammation is noted about the largest stone; mild infection cannot be excluded.  The appearance raises concern for impending development of staghorn calculus, new from the prior study.  A smaller 0.6 cm stone is noted within the right renal pelvis, without significant soft tissue inflammation.  There is also a nonobstructing 6 mm stone at the upper pole of the right kidney. Mild nonspecific perinephric stranding is noted bilaterally.  Delayed imaging demonstrates no evidence of contrast excretion from either kidney; this raises concern for decreased renal function.  No free fluid is identified.  The small bowel is largely decompressed and unremarkable in appearance.  The stomach is within normal limits.  No acute vascular abnormalities are seen.  The appendix is normal in caliber, without evidence for appendicitis.  The ascending and descending  colon remain normal in caliber, and there is only mild distension of the transverse colon; there is slightly more distension of the sigmoid colon; this appears to reflect chronic underlying sigmoid inflammation given prior evidence of colitis in 2011.  There is corresponding progressive colonic wall thickening along the distal sigmoid colon and rectum, likely reflecting the prior inflammatory process.  No distal obstruction is identified. Findings are not particularly suggestive of ileus.  A 1.3 cm soft tissue cyst along the mid back pain most likely benign in nature, given its appearance.  The bladder is mildly distended and grossly unremarkable in appearance.  The prostate is normal in size, with scattered calcification.  No inguinal lymphadenopathy is seen.  No acute osseous abnormalities are identified.  IMPRESSION:  1.  No evidence of distal obstruction; colonic distension involves predominantly the sigmoid colon, and is thought to reflect underlying chronic sequelae of prior colitis.  No definite evidence of ileus.  Mild colonic wall thickening along the distal sigmoid colon and rectum is significantly improved from 2011, likely reflects chronic underlying inflammation. 2.  Large 1.6 x 1.0 cm stone lodged at the left renal pelvis, with additional smaller left renal stones.  Soft tissue inflammation about this largest stone, with suggestion of minimal hydronephrosis, raising concern for some degree of obstruction. Infection cannot be excluded, given soft tissue inflammation; the appearance raises concern for impending development of staghorn calculus, new from the prior study. 3.  Smaller 0.6 cm stone at the right renal pelvis; additional nonobstructing right renal stones seen. 4.  No evidence of contrast excretion from either kidney on delayed images, raising concern for bilaterally decreased renal function. 5.  Partial consolidation of the right lower lung lobe, concerning for pneumonia, though significant  atelectasis could have a similar appearance.  Original Report Authenticated By: Tonia Ghent, M.D.   Ir Perc Nephrostomy Left  01/13/2012  *RADIOLOGY REPORT*  Indication:  Obstructing left sided UPJ stone with concern for infected collecting system.  1.  FLUOROSCOPIC GUIDANCE FOR PUNCTURE OF THE LEFT RENAL COLLECTING SYSTEM. 2. LEFT SIDED PERCUTANEOUS NEPHROSTOMY TUBE PLACEMENT  Comparison: CT of the abdomen and pelvis - 01/12/2012; 03/18/2010  Intravenous medications: Fentanyl 200 mcg IV; Versed 4 mg IV; The patient is currently admitted to the hospital and on intravenous antibiotics. The antibiotic was administered in an appropriate time frame prior to skin puncture.  Total Moderate Sedation Time: 65 minutes.  Contrast: 60 mL Omnipaque-300 (administered into the collecting system)  Fluoroscopy Time: 17.1 minutes.  Complications: None immediate  Procedure:  Informed written consent was obtained from the patient's family after a discussion of the risks, benefits, and alternatives to treatment.  The left flank region was prepped with Betadine in a sterile fashion, and a sterile drape was applied covering the operative  field.  A sterile gown and sterile gloves were used for the procedure.  A timeout was performed prior to the initiation of the procedure.  A pre procedural spot fluoroscopic image was obtained of the upper abdomen.  Ultrasound scanning performed of the kidney was negative for significant hydronephrosis.  As such, the stone within left UPJ was targeted fluoroscopically with a 22 gauge Chiba needle.  Access to the collecting system was confirmed with advancement of a Nitrex wire into the collecting system.  The needle was exchanged for the inner 3 French catheter from an Accustick set and contrast injection confirmed access.  A 3-French catheter was utilized to maintain access to the collecting system due to the patient's spontaneous movement on the fluoroscopy table and inability to cooperate with  the procedure.  A small amount of air was injected into the collecting system to help delineate a posterior calyx.  A posterior calyx was targeted with a 22 gauge Chiba needle.  Access to the calyx was confirmed with advancement of a Nitrex wire into the collecting system.  An Accustick set was utilized to dilate the tract. Over a Benson wire, the tract was serially dilated to 10- [place].  Initially a 10-French percutaneous nephrostomy tube was advanced to the superior aspect of the left ureter, however given the small size of the renal pelvis, the nephrostomy was unable to be coiled.  As such, this catheter was exchanged for a 10-French Renee Pain catheter which was ultimately coiled within the superior aspect of the collecting system. Postprocedural spot radiographs were obtained in various obliquities and the catheter was sutured to the skin.  The catheter was capped and a dressing was placed.  The patient tolerated the procedure well without immediate postprocedural complication.  Findings:  Pre procedural spot radiographic images demonstrates an approximately 1.1 x 5.5 cm stone overlying the expected location of the left UPJ, as demonstrated on preprocedural abdominal CT. Several additional smaller stones are identified within the left kidney.  Ultrasound scanning was negative for significant hydronephrosis and as such, the UPJ stone was targeted fluoroscopically allowing access to the collecting system.  With the collecting system now opacified, a posterior calyx was targeted fluoroscopically ultimately allowing placement of a 10-French Renee Pain catheter coiled and locked within the superior aspect of the left collecting system.  IMPRESSION:  Successful fluoroscopic guided placement of a left sided 10-French Renee Pain catheter coiled within the superior aspect of the left collecting system.  Original Report Authenticated By: Waynard Reeds, M.D.   Ir US Guide Bx Asp/drain  01/13/2012   *RADIOLOGY REPORT*  Indication:  Obstructing left sided UPJ stone with concern for infected collecting system.  1.  FLUOROSCOPIC GUIDANCE FOR PUNCTURE OF THE LEFT RENAL COLLECTING SYSTEM. 2. LEFT SIDED PERCUTANEOUS NEPHROSTOMY TUBE PLACEMENT  Comparison: CT of the abdomen and pelvis - 01/12/2012; 03/18/2010  Intravenous medications: Fentanyl 200 mcg IV; Versed 4 mg IV; The patient is currently admitted to the hospital and on intravenous antibiotics. The antibiotic was administered in an appropriate time frame prior to skin puncture.  Total Moderate Sedation Time: 65 minutes.  Contrast: 60 mL Omnipaque-300 (administered into the collecting system)  Fluoroscopy Time: 17.1 minutes.  Complications: None immediate  Procedure:  Informed written consent was obtained from the patient's family after a discussion of the risks, benefits, and alternatives to treatment.  The left flank region was prepped with Betadine in a sterile fashion, and a sterile drape was applied covering the operative field.  A  sterile gown and sterile gloves were used for the procedure.  A timeout was performed prior to the initiation of the procedure.  A pre procedural spot fluoroscopic image was obtained of the upper abdomen.  Ultrasound scanning performed of the kidney was negative for significant hydronephrosis.  As such, the stone within left UPJ was targeted fluoroscopically with a 22 gauge Chiba needle.  Access to the collecting system was confirmed with advancement of a Nitrex wire into the collecting system.  The needle was exchanged for the inner 3 French catheter from an Accustick set and contrast injection confirmed access.  A 3-French catheter was utilized to maintain access to the collecting system due to the patient's spontaneous movement on the fluoroscopy table and inability to cooperate with the procedure.  A small amount of air was injected into the collecting system to help delineate a posterior calyx.  A posterior calyx was  targeted with a 22 gauge Chiba needle.  Access to the calyx was confirmed with advancement of a Nitrex wire into the collecting system.  An Accustick set was utilized to dilate the tract. Over a Benson wire, the tract was serially dilated to 10- [place].  Initially a 10-French percutaneous nephrostomy tube was advanced to the superior aspect of the left ureter, however given the small size of the renal pelvis, the nephrostomy was unable to be coiled.  As such, this catheter was exchanged for a 10-French Renee Pain catheter which was ultimately coiled within the superior aspect of the collecting system. Postprocedural spot radiographs were obtained in various obliquities and the catheter was sutured to the skin.  The catheter was capped and a dressing was placed.  The patient tolerated the procedure well without immediate postprocedural complication.  Findings:  Pre procedural spot radiographic images demonstrates an approximately 1.1 x 5.5 cm stone overlying the expected location of the left UPJ, as demonstrated on preprocedural abdominal CT. Several additional smaller stones are identified within the left kidney.  Ultrasound scanning was negative for significant hydronephrosis and as such, the UPJ stone was targeted fluoroscopically allowing access to the collecting system.  With the collecting system now opacified, a posterior calyx was targeted fluoroscopically ultimately allowing placement of a 10-French Renee Pain catheter coiled and locked within the superior aspect of the left collecting system.  IMPRESSION:  Successful fluoroscopic guided placement of a left sided 10-French Renee Pain catheter coiled within the superior aspect of the left collecting system.  Original Report Authenticated By: Waynard Reeds, M.D.   Dg Abd Acute W/chest  01/11/2012  *RADIOLOGY REPORT*  Clinical Data: Fever.  Abdominal distention.  ACUTE ABDOMEN SERIES (ABDOMEN 2 VIEW & CHEST 1 VIEW)  Comparison: Chest dated  03/23/2010 and abdomen and pelvis CT dated 03/18/2010.  Findings: Interval dense opacity in the right mid and upper lung zones.  Clear left lung.  Grossly stable enlarged cardiac silhouette.  Prominent gas-filled redundant colon throughout the abdomen.  Small amount of fluid in the bowel loops in the decubitus position.  No free peritoneal air.  Diffuse osteopenia.  IMPRESSION:  1.  Dense right mid and upper lung zone pneumonia.  Follow-up chest radiographs are recommended until clearing to exclude an underlying mass. 2.  Stable cardiomegaly. 3.  Diffuse colonic ileus or distal obstruction.  Original Report Authenticated By: Darrol Angel, M.D.     Disposition: Skilled nursing facility  Diet: Dysphagia 3 diet with thin liquids.  Activity: Resume as tolerated   Follow-up Appts: Discharge Orders  Future Orders Please Complete By Expires   Increase activity slowly      Discharge instructions      Comments:   Followup with urology as previously scheduled in March.  Please check electrolytes on 01/22/2012 or 01/23/2012.  Please have the results forwarded to patient's primary care physician. Followup with PCP in 1 week.  Diet: Dysphagia 3 diet with thin liquids.      TESTS THAT NEED FOLLOW-UP None  Time spent on discharge, talking to the patient, and coordinating care: 35 mins.   Signed: Cristal Ford, MD 01/20/2012, 1:17 PM

## 2012-01-20 NOTE — Progress Notes (Signed)
Subjective: Patient nonverbal, per family improved.  Appetite improved, actually eating with the family's health today.  Objective: Vital signs in last 24 hours: Filed Vitals:   01/19/12 0410 01/19/12 1429 01/19/12 2141 01/20/12 0548  BP: 119/75 122/74 118/85 108/70  Pulse: 103 98 118 114  Temp: 97.7 F (36.5 C) 97.5 F (36.4 C) 98.7 F (37.1 C) 96.7 F (35.9 C)  TempSrc: Axillary Axillary Axillary Axillary  Resp: 18 18 16 16   Height:      Weight:      SpO2: 94% 95% 96% 93%   Weight change:   Intake/Output Summary (Last 24 hours) at 01/20/12 1306 Last data filed at 01/20/12 0900  Gross per 24 hour  Intake 1007.17 ml  Output   1300 ml  Net -292.83 ml    Physical Exam: General: Awake, No acute distress, nonverbal. HEENT: EOMI. Neck: Supple CV: S1 and S2 Lungs: Clear to ascultation bilaterally Abdomen: Soft, Nontender, Nondistended, +bowel sounds. Ext: Good pulses. Trace edema. Back: Percutaneous drain in place.  Lab Results:  Basename 01/20/12 0325 01/19/12 0500  NA 147* 145  K 3.9 3.1*  CL 110 108  CO2 33* 33*  GLUCOSE 125* 151*  BUN 5* 4*  CREATININE 0.80 0.72  CALCIUM 8.4 7.8*  MG 2.0 2.0  PHOS -- --   No results found for this basename: AST:2,ALT:2,ALKPHOS:2,BILITOT:2,PROT:2,ALBUMIN:2 in the last 72 hours No results found for this basename: LIPASE:2,AMYLASE:2 in the last 72 hours  Basename 01/19/12 0500  WBC 7.8  NEUTROABS --  HGB 13.0  HCT 39.5  MCV 92.5  PLT 360   No results found for this basename: CKTOTAL:3,CKMB:3,CKMBINDEX:3,TROPONINI:3 in the last 72 hours No components found with this basename: POCBNP:3 No results found for this basename: DDIMER:2 in the last 72 hours No results found for this basename: HGBA1C:2 in the last 72 hours No results found for this basename: CHOL:2,HDL:2,LDLCALC:2,TRIG:2,CHOLHDL:2,LDLDIRECT:2 in the last 72 hours No results found for this basename: TSH,T4TOTAL,FREET3,T3FREE,THYROIDAB in the last 72 hours No  results found for this basename: VITAMINB12:2,FOLATE:2,FERRITIN:2,TIBC:2,IRON:2,RETICCTPCT:2 in the last 72 hours  Micro Results: Recent Results (from the past 240 hour(s))  CULTURE, BLOOD (ROUTINE X 2)     Status: Normal   Collection Time   01/12/12 12:00 AM      Component Value Range Status Comment   Specimen Description BLOOD LEFT HAND   Final    Special Requests BOTTLES DRAWN AEROBIC ONLY 2 CC   Final    Culture  Setup Time [id number]   Final    Culture NO GROWTH 5 DAYS   Final    Report Status 01/18/2012 FINAL   Final   CULTURE, BLOOD (ROUTINE X 2)     Status: Normal   Collection Time   01/12/12  2:15 AM      Component Value Range Status Comment   Specimen Description BLOOD LEFT FINGER   Final    Special Requests BOTTLES DRAWN AEROBIC ONLY 5 CC   Final    Culture  Setup Time [id number]   Final    Culture NO GROWTH 5 DAYS   Final    Report Status 01/18/2012 FINAL   Final   MRSA PCR SCREENING     Status: Abnormal   Collection Time   01/12/12  4:03 AM      Component Value Range Status Comment   MRSA by PCR POSITIVE (*) NEGATIVE  Final   CLOSTRIDIUM DIFFICILE BY PCR     Status: Abnormal   Collection Time   01/13/12  7:50 AM      Component Value Range Status Comment   C difficile by pcr POSITIVE (*) NEGATIVE  Final   OVA AND PARASITE EXAMINATION     Status: Normal   Collection Time   01/16/12  9:02 AM      Component Value Range Status Comment   Specimen Description STOOL   Final    Special Requests NONE   Final    Ova and parasites NO OVA OR PARASITES SEEN   Final    Report Status 01/19/2012 FINAL   Final     Studies/Results: No results found.  Medications: I have reviewed the patient's current medications. Scheduled Meds:    .  ceFAZolin (ANCEF) IV  1 g Intravenous 30 min Pre-Op  . ciprofloxacin  400 mg Intravenous 60 min Pre-Op  . cloNIDine  0.1 mg Transdermal Weekly  . enoxaparin  40 mg Subcutaneous Q24H  . feeding supplement  237 mL Oral TID WC  . lactulose   30 g Oral Daily  . PHENobarbital  64.8 mg Oral Daily   And  . PHENObarbital  97.2 mg Oral Daily  . phenytoin  200 mg Oral BID  . potassium chloride  40 mEq Oral Once  . vancomycin  500 mg Oral Q6H  . DISCONTD:  ceFAZolin (ANCEF) IV  1 g Intravenous 30 min Pre-Op  . DISCONTD: ciprofloxacin  400 mg Intravenous 60 min Pre-Op  . DISCONTD: PHENObarbital  100 mg Oral Daily  . DISCONTD: PHENObarbital  60 mg Oral BID  . DISCONTD: PHENObarbital  64.8 mg Oral BID  . DISCONTD: PHENobarbital  64.8 mg Oral Daily  . DISCONTD: PHENObarbital  97.2 mg Oral Daily  . DISCONTD: PHENObarbital  97.2 mg Oral Daily  . DISCONTD: PHENObarbital  160 mg Oral Daily   Continuous Infusions:    . dextrose 10 mL (01/19/12 1702)   PRN Meds:.albuterol, ipratropium  Assessment/Plan: Healthcare-associated pneumonia / Leukocytosis / Sepsis / C. difficile colitis. The patient was admitted and blood cultures show no growth to date.  Patient has multiple infectious sources including his urinary tract, pneumonia and clostridium difficile colitis.  Vancomycin 2/18 - 01/14/2012.  Zosyn from 01/12/2012 - 01/18/2012 to complete a seven-day course of antibiotics.  Metronidazole 2/19 - 01/16/2012, for C. differential colitis.  Continue oral vancomycin for C. difficile colitis, antibiotic since 01/13/2012, define a total of 14 day course.  Oxygen saturations are stable.  Leukocytosis improving. Speech therapy recommending dysphagia 3 diet with thin liquids with full supervision.    ARF (acute renal failure) / Nephrolithiasis /Hydronephrosis / Hypernatremia. Status post urological consultation.  A percutaneous nephrostomy tube done on 01/13/2012. Will need outpatient lithotripsy when stable.  Hypernatremia improved likely due to acute renal failure.  Severe hypokalemia Final a result the Replace as needed.  Given my magnesium sulfate x1 on 01/18/2012.  Will define low dose potassium daily for 5 days after discharge.  Continue to  replete aggressively.  Epilepsy  The patient is being maintained on his home doses of dilantin and phenobarbital. No seizure events noted in the hospital.  EEG performed on February 25 of 2013 showed abnormal EEG secondary to independent left and right hemisphere sharp activity but is suggestive of possible seizure disorder.  No electrographic seizures were noted.    Mental retardation  The patient understands "yes or no" but no other communication. He does not talk and stopped ambulating ~ 6 yrs ago. He does not sit up on his own. He usually has a good  appetite and eats regular food, however has had a decreased oral intake which is slowly started to improve.  Clostridium difficile colitis  Known history of ulcerative colitis. No evidence of obstruction or ileus. Stool for clostridium difficile by PCR noted to be positive.  Continue oral Vancomycin as indicated above.  Has not had any further episodes of diarrhea.  Hyperglycemia  Hemoglobin A1c is normal at 5.5%.  Prophylaxis Lovenox  Disposition Discharge the patient to skilled nursing facility.   LOS: 9 days  Charles Humphrey A, MD 01/20/2012, 1:06 PM

## 2012-01-27 ENCOUNTER — Encounter (HOSPITAL_COMMUNITY): Payer: Self-pay | Admitting: *Deleted

## 2012-01-27 ENCOUNTER — Encounter (HOSPITAL_COMMUNITY): Payer: Self-pay | Admitting: Pharmacy Technician

## 2012-01-27 NOTE — Pre-Procedure Instructions (Signed)
Pre-op instructions for day of surgery reviewed with Charles Humphrey over the phone along with faxing the sheet also to her at [phone].

## 2012-01-27 NOTE — Pre-Procedure Instructions (Signed)
Consulted with Anesthesia,Dr. Acey Lav about patient's Phenobarital dose (nursing home states they give it in applesauce). Per Dr. Acey Lav, give Phenobarbital 100 mg at bedtime night before surgery and Hold am dose am of surgery.

## 2012-01-30 ENCOUNTER — Other Ambulatory Visit: Payer: Self-pay | Admitting: Urology

## 2012-02-04 NOTE — Pre-Procedure Instructions (Signed)
SPOKE WITH LORI LANGLEY, LPN AT [facility] TO LET HER KNOW PT'S SURGERY TIME MOVED UP TO 8:30 AM  TOMORROW  3/14  AND PT TO ARRIVE TO SHORT STAY BY 6:30 AM.  Lawson Fiscal WILL NOTIFY PT'S FATHER OF TIME CHANGE--AS HE IS POA AND PLANS TO COME TO SIGN CONSENT.

## 2012-02-05 ENCOUNTER — Encounter (HOSPITAL_COMMUNITY)
Admission: RE | Disposition: A | Discharge status: Skilled Nursing Facility | Payer: Self-pay | Source: Ambulatory Visit | Attending: Urology

## 2012-02-05 ENCOUNTER — Ambulatory Visit (HOSPITAL_COMMUNITY): Payer: Medicare Other

## 2012-02-05 ENCOUNTER — Observation Stay (HOSPITAL_COMMUNITY)
Admission: RE | Admit: 2012-02-05 | Discharge: 2012-02-06 | Disposition: A | Discharge status: Skilled Nursing Facility | Payer: Medicare Other | Source: Ambulatory Visit | Attending: Urology | Admitting: Urology

## 2012-02-05 ENCOUNTER — Observation Stay (HOSPITAL_COMMUNITY): Payer: Medicare Other

## 2012-02-05 ENCOUNTER — Ambulatory Visit (HOSPITAL_COMMUNITY): Payer: Medicare Other | Admitting: *Deleted

## 2012-02-05 ENCOUNTER — Encounter (HOSPITAL_COMMUNITY): Payer: Self-pay | Admitting: *Deleted

## 2012-02-05 ENCOUNTER — Other Ambulatory Visit: Payer: Self-pay

## 2012-02-05 DIAGNOSIS — Z01818 Encounter for other preprocedural examination: Secondary | ICD-10-CM | POA: Insufficient documentation

## 2012-02-05 DIAGNOSIS — J189 Pneumonia, unspecified organism: Secondary | ICD-10-CM | POA: Insufficient documentation

## 2012-02-05 DIAGNOSIS — N179 Acute kidney failure, unspecified: Secondary | ICD-10-CM | POA: Insufficient documentation

## 2012-02-05 DIAGNOSIS — Z79899 Other long term (current) drug therapy: Secondary | ICD-10-CM | POA: Insufficient documentation

## 2012-02-05 DIAGNOSIS — G40909 Epilepsy, unspecified, not intractable, without status epilepticus: Secondary | ICD-10-CM | POA: Insufficient documentation

## 2012-02-05 DIAGNOSIS — D72829 Elevated white blood cell count, unspecified: Secondary | ICD-10-CM | POA: Insufficient documentation

## 2012-02-05 DIAGNOSIS — N200 Calculus of kidney: Principal | ICD-10-CM | POA: Insufficient documentation

## 2012-02-05 DIAGNOSIS — N201 Calculus of ureter: Secondary | ICD-10-CM | POA: Insufficient documentation

## 2012-02-05 DIAGNOSIS — F79 Unspecified intellectual disabilities: Secondary | ICD-10-CM | POA: Insufficient documentation

## 2012-02-05 DIAGNOSIS — A0472 Enterocolitis due to Clostridium difficile, not specified as recurrent: Secondary | ICD-10-CM | POA: Insufficient documentation

## 2012-02-05 DIAGNOSIS — Z436 Encounter for attention to other artificial openings of urinary tract: Secondary | ICD-10-CM | POA: Insufficient documentation

## 2012-02-05 DIAGNOSIS — G9341 Metabolic encephalopathy: Secondary | ICD-10-CM | POA: Insufficient documentation

## 2012-02-05 HISTORY — PX: NEPHROLITHOTOMY: SHX5134

## 2012-02-05 LAB — CBC
HCT: 40.6 % (ref 39.0–52.0)
Hemoglobin: 13.1 g/dL (ref 13.0–17.0)
MCHC: 32.3 g/dL (ref 30.0–36.0)
RDW: 16.3 % — ABNORMAL HIGH (ref 11.5–15.5)
WBC: 6.7 10*3/uL (ref 4.0–10.5)

## 2012-02-05 LAB — BASIC METABOLIC PANEL
BUN: 16 mg/dL (ref 6–23)
Chloride: 107 mEq/L (ref 96–112)
GFR calc Af Amer: >90 mL/min (ref >90)
GFR calc non Af Amer: >90 mL/min (ref >90)
Potassium: 4.3 mEq/L (ref 3.5–5.1)
Sodium: 140 mEq/L (ref 135–145)

## 2012-02-05 LAB — SURGICAL PCR SCREEN
MRSA, PCR: NEGATIVE
Staphylococcus aureus: NEGATIVE

## 2012-02-05 SURGERY — NEPHROLITHOTOMY PERCUTANEOUS
Anesthesia: General | Laterality: Left | Wound class: Clean Contaminated

## 2012-02-05 MED ORDER — IOHEXOL 300 MG/ML  SOLN
INTRAMUSCULAR | Status: AC
  Filled 2012-02-05: qty 2

## 2012-02-05 MED ORDER — BISACODYL 10 MG RE SUPP: 10.0000 mg | Freq: Every day | RECTAL | Status: DC | PRN

## 2012-02-05 MED ORDER — FENTANYL CITRATE 0.05 MG/ML IJ SOLN
INTRAMUSCULAR | Status: DC | PRN
  Administered 2012-02-05 (×2): 50 ug via INTRAVENOUS

## 2012-02-05 MED ORDER — KCL IN DEXTROSE-NACL 20-5-0.45 MEQ/L-%-% IV SOLN
INTRAVENOUS | Status: DC
  Administered 2012-02-05: 16:00:00 via INTRAVENOUS
  Filled 2012-02-05 (×5): qty 1000

## 2012-02-05 MED ORDER — HYDROCODONE-ACETAMINOPHEN 5-325 MG PO TABS
1.0000 | ORAL_TABLET | ORAL | Status: DC | PRN
  Filled 2012-02-05: qty 2

## 2012-02-05 MED ORDER — LACTATED RINGERS IV SOLN
INTRAVENOUS | Status: DC | PRN
  Administered 2012-02-05: 08:00:00 via INTRAVENOUS

## 2012-02-05 MED ORDER — ALBUTEROL SULFATE (5 MG/ML) 0.5% IN NEBU
2.5000 mg | INHALATION_SOLUTION | Freq: Four times a day (QID) | RESPIRATORY_TRACT | Status: DC
  Administered 2012-02-05 (×2): 2.5 mg via RESPIRATORY_TRACT
  Filled 2012-02-05 (×3): qty 0.5

## 2012-02-05 MED ORDER — GUAIFENESIN ER 600 MG PO TB12
1200.0000 mg | ORAL_TABLET | Freq: Two times a day (BID) | ORAL | Status: DC
  Administered 2012-02-05 – 2012-02-06 (×2): 1200 mg via ORAL
  Filled 2012-02-05 (×4): qty 2

## 2012-02-05 MED ORDER — PHENOBARBITAL 100 MG PO TABS: 100.0000 mg | ORAL_TABLET | ORAL | Status: DC

## 2012-02-05 MED ORDER — DEXTROSE 5 % IV SOLN
1.0000 g | Freq: Once | INTRAVENOUS | Status: DC
  Filled 2012-02-05: qty 10

## 2012-02-05 MED ORDER — CISATRACURIUM BESYLATE 2 MG/ML IV SOLN
INTRAVENOUS | Status: DC | PRN
  Administered 2012-02-05: 8 mg via INTRAVENOUS

## 2012-02-05 MED ORDER — HYDROMORPHONE HCL PF 1 MG/ML IJ SOLN
0.5000 mg | INTRAMUSCULAR | Status: DC | PRN
  Administered 2012-02-05: 1 mg via INTRAVENOUS
  Filled 2012-02-05: qty 1

## 2012-02-05 MED ORDER — CEFAZOLIN SODIUM-DEXTROSE 2-3 GM-% IV SOLR: 2.0000 g | Freq: Once | INTRAVENOUS | Status: DC

## 2012-02-05 MED ORDER — ZOLPIDEM TARTRATE 5 MG PO TABS: 5.0000 mg | ORAL_TABLET | Freq: Every evening | ORAL | Status: DC | PRN

## 2012-02-05 MED ORDER — LACTULOSE 10 GM/15ML PO SOLN
30.0000 g | Freq: Every day | ORAL | Status: DC
  Administered 2012-02-06: 30 g via ORAL
  Filled 2012-02-05: qty 45

## 2012-02-05 MED ORDER — ACETAMINOPHEN 325 MG PO TABS: 650.0000 mg | ORAL_TABLET | ORAL | Status: DC | PRN

## 2012-02-05 MED ORDER — POTASSIUM CHLORIDE ER 10 MEQ PO TBCR
20.0000 meq | EXTENDED_RELEASE_TABLET | Freq: Every day | ORAL | Status: DC
  Administered 2012-02-06: 20 meq via ORAL
  Filled 2012-02-05: qty 2

## 2012-02-05 MED ORDER — HYDROMORPHONE HCL PF 1 MG/ML IJ SOLN
INTRAMUSCULAR | Status: AC
  Filled 2012-02-05: qty 1

## 2012-02-05 MED ORDER — ENSURE CLINICAL ST REVIGOR PO LIQD
237.0000 mL | Freq: Three times a day (TID) | ORAL | Status: DC
  Administered 2012-02-05: 237 mL via ORAL
  Administered 2012-02-06: 08:00:00 via ORAL
  Administered 2012-02-06: 200 mL via ORAL

## 2012-02-05 MED ORDER — NEOSTIGMINE METHYLSULFATE 1 MG/ML IJ SOLN
INTRAMUSCULAR | Status: DC | PRN
  Administered 2012-02-05: 2 mg via INTRAVENOUS

## 2012-02-05 MED ORDER — DEXTROSE 5 % IV SOLN
1.0000 g | INTRAVENOUS | Status: DC
  Administered 2012-02-06: 1 g via INTRAVENOUS
  Filled 2012-02-05 (×2): qty 10

## 2012-02-05 MED ORDER — MUPIROCIN 2 % EX OINT
TOPICAL_OINTMENT | CUTANEOUS | Status: AC
  Filled 2012-02-05: qty 22

## 2012-02-05 MED ORDER — ALBUTEROL SULFATE (5 MG/ML) 0.5% IN NEBU
2.5000 mg | INHALATION_SOLUTION | Freq: Three times a day (TID) | RESPIRATORY_TRACT | Status: DC
  Administered 2012-02-06: 2.5 mg via RESPIRATORY_TRACT
  Filled 2012-02-05: qty 0.5

## 2012-02-05 MED ORDER — PHENOBARBITAL 60 MG PO TABS
60.0000 mg | ORAL_TABLET | Freq: Every day | ORAL | Status: DC
  Administered 2012-02-06: 60 mg via ORAL
  Filled 2012-02-05: qty 1

## 2012-02-05 MED ORDER — SODIUM CHLORIDE 0.9 % IV SOLN
INTRAVENOUS | Status: DC | PRN
  Administered 2012-02-05: 09:00:00 via INTRAVENOUS

## 2012-02-05 MED ORDER — SODIUM CHLORIDE 0.9 % IV SOLN
1.0000 g | INTRAVENOUS | Status: DC | PRN
  Administered 2012-02-05: 1 g via INTRAVENOUS

## 2012-02-05 MED ORDER — CIPROFLOXACIN IN D5W 400 MG/200ML IV SOLN
INTRAVENOUS | Status: DC | PRN
  Administered 2012-02-05: 400 mg via INTRAVENOUS

## 2012-02-05 MED ORDER — EPHEDRINE SULFATE 50 MG/ML IJ SOLN
INTRAMUSCULAR | Status: DC | PRN
  Administered 2012-02-05: 10 mg via INTRAVENOUS

## 2012-02-05 MED ORDER — SODIUM CHLORIDE 0.9 % IR SOLN
Status: DC | PRN
  Administered 2012-02-05: 7000 mL

## 2012-02-05 MED ORDER — CIPROFLOXACIN IN D5W 400 MG/200ML IV SOLN
INTRAVENOUS | Status: AC
  Filled 2012-02-05: qty 200

## 2012-02-05 MED ORDER — MIDAZOLAM HCL 2 MG/2ML IJ SOLN
INTRAMUSCULAR | Status: AC
  Filled 2012-02-05: qty 2

## 2012-02-05 MED ORDER — ONDANSETRON HCL 4 MG/2ML IJ SOLN: 4.0000 mg | INTRAMUSCULAR | Status: DC | PRN

## 2012-02-05 MED ORDER — HYDROMORPHONE HCL PF 1 MG/ML IJ SOLN
0.2500 mg | INTRAMUSCULAR | Status: DC | PRN
  Administered 2012-02-05 (×4): 0.25 mg via INTRAVENOUS

## 2012-02-05 MED ORDER — ALBUTEROL SULFATE (5 MG/ML) 0.5% IN NEBU: 2.5000 mg | INHALATION_SOLUTION | Freq: Four times a day (QID) | RESPIRATORY_TRACT | Status: DC | PRN

## 2012-02-05 MED ORDER — IOHEXOL 300 MG/ML  SOLN
INTRAMUSCULAR | Status: DC | PRN
  Administered 2012-02-05: 15 mL

## 2012-02-05 MED ORDER — SUCCINYLCHOLINE CHLORIDE 20 MG/ML IJ SOLN
INTRAMUSCULAR | Status: DC | PRN
  Administered 2012-02-05: 100 mg via INTRAVENOUS

## 2012-02-05 MED ORDER — LIDOCAINE HCL (CARDIAC) 20 MG/ML IV SOLN
INTRAVENOUS | Status: DC | PRN
  Administered 2012-02-05: 75 mg via INTRAVENOUS

## 2012-02-05 MED ORDER — CIPROFLOXACIN IN D5W 400 MG/200ML IV SOLN: 400.0000 mg | INTRAVENOUS | Status: DC

## 2012-02-05 MED ORDER — GLYCOPYRROLATE 0.2 MG/ML IJ SOLN
INTRAMUSCULAR | Status: DC | PRN
  Administered 2012-02-05: 0.4 mg via INTRAVENOUS

## 2012-02-05 MED ORDER — PHENOBARBITAL 20 MG/5ML PO ELIX
100.0000 mg | ORAL_SOLUTION | Freq: Once | ORAL | Status: DC
  Administered 2012-02-05: 100 mg via ORAL
  Filled 2012-02-05: qty 30

## 2012-02-05 MED ORDER — ETOMIDATE 2 MG/ML IV SOLN
INTRAVENOUS | Status: DC | PRN
  Administered 2012-02-05: 14 mg via INTRAVENOUS

## 2012-02-05 MED ORDER — PHENYTOIN 125 MG/5ML PO SUSP
200.0000 mg | Freq: Two times a day (BID) | ORAL | Status: DC
Start: 2012-02-05 — End: 2012-02-06
  Administered 2012-02-05 – 2012-02-06 (×2): 200 mg via ORAL
  Filled 2012-02-05 (×4): qty 8

## 2012-02-05 MED ORDER — MIDAZOLAM HCL 5 MG/5ML IJ SOLN
INTRAMUSCULAR | Status: DC | PRN
  Administered 2012-02-05: .5 mg via INTRAVENOUS

## 2012-02-05 SURGICAL SUPPLY — 40 items
BAG URINE DRAINAGE (UROLOGICAL SUPPLIES) ×2 IMPLANT
BASKET ZERO TIP NITINOL 2.4FR (BASKET) IMPLANT
BENZOIN TINCTURE PRP APPL 2/3 (GAUZE/BANDAGES/DRESSINGS) ×2 IMPLANT
BLADE SURG 15 STRL LF DISP TIS (BLADE) ×1 IMPLANT
BLADE SURG 15 STRL SS (BLADE) ×1
CATH FOLEY 2W COUNCIL 20FR 5CC (CATHETERS) IMPLANT
CATH ROBINSON RED A/P 20FR (CATHETERS) IMPLANT
CATH X-FORCE N30 NEPHROSTOMY (TUBING) ×2 IMPLANT
CLOTH BEACON ORANGE TIMEOUT ST (SAFETY) ×2 IMPLANT
COVER SURGICAL LIGHT HANDLE (MISCELLANEOUS) ×2 IMPLANT
DRAPE C-ARM 42X72 X-RAY (DRAPES) ×2 IMPLANT
DRAPE CAMERA CLOSED 9X96 (DRAPES) ×2 IMPLANT
DRAPE LINGEMAN PERC (DRAPES) ×2 IMPLANT
DRAPE SURG IRRIG POUCH 19X23 (DRAPES) ×2 IMPLANT
DRSG PAD ABDOMINAL 8X10 ST (GAUZE/BANDAGES/DRESSINGS) ×2 IMPLANT
DRSG TEGADERM 8X12 (GAUZE/BANDAGES/DRESSINGS) ×2 IMPLANT
GLOVE SURG SS PI 8.0 STRL IVOR (GLOVE) ×12 IMPLANT
GOWN STRL REIN XL XLG (GOWN DISPOSABLE) ×4 IMPLANT
GUIDEWIRE STR DUAL SENSOR (WIRE) ×2 IMPLANT
KIT BASIN OR (CUSTOM PROCEDURE TRAY) ×2 IMPLANT
LASER FIBER DISP (UROLOGICAL SUPPLIES) IMPLANT
LASER FIBER DISP 1000U (UROLOGICAL SUPPLIES) IMPLANT
MANIFOLD NEPTUNE II (INSTRUMENTS) ×2 IMPLANT
NS IRRIG 1000ML POUR BTL (IV SOLUTION) ×2 IMPLANT
PACK BASIC VI WITH GOWN DISP (CUSTOM PROCEDURE TRAY) ×2 IMPLANT
PAD ABD 7.5X8 STRL (GAUZE/BANDAGES/DRESSINGS) ×4 IMPLANT
PROBE LITHOCLAST ULTRA 3.8X403 (UROLOGICAL SUPPLIES) IMPLANT
PROBE PNEUMATIC 1.0MMX570MM (UROLOGICAL SUPPLIES) IMPLANT
SET IRRIG Y TYPE TUR BLADDER L (SET/KITS/TRAYS/PACK) IMPLANT
SET WARMING FLUID IRRIGATION (MISCELLANEOUS) ×2 IMPLANT
SPONGE GAUZE 4X4 12PLY (GAUZE/BANDAGES/DRESSINGS) ×2 IMPLANT
SPONGE LAP 4X18 X RAY DECT (DISPOSABLE) ×2 IMPLANT
STONE CATCHER W/TUBE ADAPTER (UROLOGICAL SUPPLIES) IMPLANT
SUT SILK 2 0 30  PSL (SUTURE) ×1
SUT SILK 2 0 30 PSL (SUTURE) ×1 IMPLANT
SYR 20CC LL (SYRINGE) ×4 IMPLANT
SYRINGE 10CC LL (SYRINGE) ×2 IMPLANT
TOWEL OR NON WOVEN STRL DISP B (DISPOSABLE) IMPLANT
TRAY FOLEY CATH 14FRSI W/METER (CATHETERS) ×2 IMPLANT
TUBING CONNECTING 10 (TUBING) ×4 IMPLANT

## 2012-02-05 NOTE — Interval H&P Note (Signed)
History and Physical Interval Note:  02/05/2012 7:24 AM  Charles Humphrey  has presented today for surgery, with the diagnosis of left renal stone  The various methods of treatment have been discussed with the patient and family. After consideration of risks, benefits and other options for treatment, the patient has consented to  Procedure(s) (LRB): NEPHROLITHOTOMY PERCUTANEOUS (Left) as a surgical intervention .  The patients' history has been reviewed, patient examined, no change in status, stable for surgery.  I have reviewed the patients' chart and labs.  Questions were answered to the patient's father's satisfaction.     Kainon Varady J

## 2012-02-05 NOTE — Anesthesia Postprocedure Evaluation (Signed)
  Anesthesia Post-op Note  Patient: Charles Humphrey  Procedure(s) Performed: Procedure(s) (LRB): NEPHROLITHOTOMY PERCUTANEOUS (Left)  Patient Location: PACU  Anesthesia Type: General  Level of Consciousness: responds to stimulation  Airway and Oxygen Therapy: Patient Spontanous Breathing and Patient connected to nasal cannula oxygen  Post-op Pain: mild  Post-op Assessment: Post-op Vital signs reviewed, Patient's Cardiovascular Status Stable, Respiratory Function Stable and Patent Airway  Post-op Vital Signs: stable  Complications: No apparent anesthesia complications

## 2012-02-05 NOTE — Anesthesia Preprocedure Evaluation (Signed)
Anesthesia Evaluation  Patient identified by MRN, date of birth, ID band Patient awake  General Assessment Comment:Hx from parents  Reviewed: Allergy & Precautions, H&P , NPO status , Patient's Chart, lab work & pertinent test results, reviewed documented beta blocker date and time   Airway Mallampati: III  Neck ROM: Limited    Dental  (+) Teeth Intact   Pulmonary neg pulmonary ROS,  breath sounds clear to auscultation        Cardiovascular negative cardio ROS  Rhythm:Regular Rate:Normal  Denies cardiac symptoms   Neuro/Psych Seizures -, Well Controlled,  MR Does communicate Occasional petit mal seizure negative psych ROS   GI/Hepatic negative GI ROS, Neg liver ROS,   Endo/Other  negative endocrine ROS  Renal/GU Kidney stone-left  negative genitourinary   Musculoskeletal negative musculoskeletal ROS (+)   Abdominal   Peds negative pediatric ROS (+)  Hematology negative hematology ROS (+)   Anesthesia Other Findings   Reproductive/Obstetrics negative OB ROS                           Anesthesia Physical Anesthesia Plan  ASA: III  Anesthesia Plan: General   Post-op Pain Management:    Induction: Intravenous  Airway Management Planned: Oral ETT  Additional Equipment:   Intra-op Plan:   Post-operative Plan: Extubation in OR  Informed Consent: I have reviewed the patients History and Physical, chart, labs and discussed the procedure including the risks, benefits and alternatives for the proposed anesthesia with the patient or authorized representative who has indicated his/her understanding and acceptance.   Dental advisory given  Plan Discussed with: CRNA and Surgeon  Anesthesia Plan Comments:         Anesthesia Quick Evaluation

## 2012-02-05 NOTE — Progress Notes (Signed)
Pt's Father unable to get here until 8:30 which is what he was previously informed of before surgery time changed.  Pt's Father (POA) unaware of pt's surgery time changed.  Dr. Annabell Howells notifed and decided to go ahead and take pt to holding and Pt's father can sign consent in OR.

## 2012-02-05 NOTE — Discharge Instructions (Signed)
[address] E[address]ARD  01/27/2012   Your procedure is scheduled on:  Thursday 02/05/2012 at 1100am  Report to [facility] at 0800 AM.  Call this number if you have problems the morning of surgery: [phone]   Remember:Per Anesthesia ,Dr. Acey Lav, give Phenobarbital 100 mg night before surgery and Do Not give AM dose.   Do not eat food:After Midnight.  May have clear liquids:until Midnight .  Take these medicines the morning of surgery with A SIP OF WATER: Dilantin,Proventil nebulizer if needed   Do not wear jewelry,  Do not wear lotions, powders, or perfumes.     For patients admitted to the hospital, checkout time is 11:00 AM the day of discharge    Special Instructions: CHG Shower Use Special Wash: 1/2 bottle night before surgery and 1/2 bottle morning of surgery.(bathe chin to toes with Betasept and wash face and private area with regular soap)   Please read over the following fact sheets that you were given: MRSA Information   Any questions -call Meriam Sprague Nanney,RN,Percutaneous Nephrolithotomy Kidney stones can cause a great deal of pain. They can block urine from leaving the body. And they can lead to infection. Kidney stones might pass on their own, or they can be broken up by shock waves from a special machine. But sometimes surgery is needed to get rid of kidney stones. One type of surgery is called percutaneous nephrolithotomy. "Nephro" means kidney, "litho" means stone and "tomy" means removal by surgery. "Percutaneous" means through the skin. This type of surgery needs only a small cut (incision) in the skin. This surgery may be suggested for various reasons. They include:  The kidney stones are 2 centimeters wide (about 3/4 inch) or bigger. They also might be oddly shaped.   Other treatments were tried, but all or some of the kidney stones remain.   Infection has developed.   No other treatment can be used.  LET YOUR CAREGIVER KNOW ABOUT:   Any  allergies.   All medications you are taking, including:   Herbs, eyedrops, over-the-counter medications and creams.   Blood thinners (anticoagulants), aspirin or other drugs that could affect blood clotting.   Use of steroids (by mouth or as creams).   Previous problems with anesthetics, including local anesthetics.   Possibility of pregnancy, if this applies.   Any history of blood clots.   Any history of bleeding or other blood problems.   Previous surgery.   Smoking history.   Other health problems.  RISKS AND COMPLICATIONS   During surgery:   Sometimes all the kidney stones cannot be removed through the tube. Then, the percutaneous nephrolithotomy procedure would be stopped. Open surgery would be used to remove the remaining stones.   Short-term risks from the surgery could include:   Excessive bleeding.   Blood in your urine.   Holes in the kidney. These usually heal on their own.   Pain.   Redness or tenderness at the incision site.   Numbness (loss of feeling) in the area treated. Tingling also is possible.   A pooling of blood in the wound (hematoma).   Infection.   Slow healing.   Longer-term possibilities include:   Kidney damage.   Damage to organs near the kidney.   Need for a repeat surgery.  BEFORE THE PROCEDURE  You may need to take some tests before your surgery. These might include:   Blood tests.   Urine tests.   Tests to make sure your  heart is working properly.   Let your healthcare provider know if you think you might have a urinary tract infection. You will probably need to take an antibiotic to treat this before the surgery.   Two weeks before your surgery, stop using aspirin and non-steroidal anti-inflammatory drugs (NSAIDs) for pain relief. This includes prescription drugs and over-the-counter drugs such as ibuprofen and naproxen. Also stop taking vitamin E.   If you take blood-thinners, ask your healthcare provider when  you should stop taking them.   Do not eat or drink for about 8 hours before your surgery.   You might be asked to shower or wash with a special antibacterial soap before the procedure.   Arrive at least an hour before the surgery, or whenever your surgeon recommends. This will give you time to check in and fill out any needed paperwork.  PROCEDURE  The preparation:   You will change into a hospital gown.   You will be given an IV. A needle will be inserted in your arm. Medication will be able to flow directly into your body through this needle.   You might be given a sedative. This medication will help you relax.   You may be given a general anesthetic (a drug that will put you to sleep during the surgery). Or, you may get a local anesthetic (part of your body will be numb, but you will remain awake).   A catheter (tube) will be put in your bladder to drain urine during and after surgery.   The procedure:   The surgeon will make a small incision in your lower back.   A tube will be inserted through the incision into your kidney.   Each kidney stone is removed through this tube. Larger stones may first be broken up with a laser (a high-intensity light beam) or other tools.   If a kidney stone has already left the kidney, the surgeon would use a special tool to bring it back in. Then it would be removed through the tube.   After all the stones are taken out, a catheter will be put in. Fluid can build up around the kidney as it heals. The catheter lets this fluid drain out of the body.   A dressing (medicine and bandage) will be put on the incision area.   The surgery usually takes three to four hours.  AFTER THE PROCEDURE  You will stay in a recovery area until the anesthesia has worn off. Your blood pressure and pulse will be checked often. You might be given more pain medication.   You will be moved to a hospital room for the rest of your stay.   The catheter that is taking  urine out of your body will be taken out within 24 hours.   The day after your surgery, you should be able to walk around. Walking helps prevent blood clots (thick clumps that can block the flow of blood).   You may be asked to do some breathing exercises.   You will be able to have only liquids for a day or two.   Before you go home:   The catheter that is draining fluid from the kidney area is usually taken out.   You will be taught how to care for the incision. Be sure to ask how often the dressing should be changed and when it can get wet.   You also will be told what you should and should not do while your  kidney and incisions heal. For instance, you may be urged to walk to prevent blood clots.  Document Released: 09/07/2009 Document Revised: 10/30/2011 Document Reviewed: 09/07/2009 [facility] Patient Information 2012 [place], [place].BSN at [phone].

## 2012-02-05 NOTE — Progress Notes (Signed)
Pt brought in on stretcher via EMS.  Pt does not respond verbally.  Awaiting on pt's Father so he can sign patients consent.

## 2012-02-05 NOTE — Progress Notes (Signed)
Patient ID: Charles Humphrey, male   DOB: July 26, 1956, 56 y.o.   MRN: [id number] [name] seems comfortable.  He has excellent UOP from his LNT with pink urine in the tube.  He also has excellent foley output with clear urine.  He is afebrile.  CT in AM to assess for residual fragments.   If there are no fragments, the tube can be removed and he will be discharged.   If there are fragments, he will need to be scheduled for a second look, but can probably be returned to [facility] in the interim.  He also has a right renal stone that will be assess with the CT.

## 2012-02-05 NOTE — Op Note (Signed)
NAMEMarland Kitchen  BRON, SNELLINGS NO.:  [id number]  MEDICAL RECORD NO.:  [id number]  LOCATION:  1419                         FACILITY:  [facility]  PHYSICIAN:  Excell Seltzer. Annabell Howells, M.D.    DATE OF BIRTH:  11/21/1956  DATE OF PROCEDURE:  02/05/2012 DATE OF DISCHARGE:                              OPERATIVE REPORT   PROCEDURE:  Left percutaneous nephrostolithotomy for less than 2-cm stone.  PREOPERATIVE DIAGNOSIS:  Left renal stone.  POSTOPERATIVE DIAGNOSIS:  Left renal stone.  SURGEON:  Excell Seltzer. Annabell Howells, M.D.  ANESTHESIA:  General.  SPECIMEN:  Stone fragments.  DRAINS: 1. A 22-French left nephrostomy tube. 2. A 6-French Kumpe safety catheter. 3. Foley catheter.  BLOOD LOSS:  Minimal.  COMPLICATIONS:  None.  INDICATIONS:  Charles Humphrey is a 56 year old severely mentally handicapped white male, who was recently admitted to the hospital with urosepsis.  He was found to have a 1.6-cm obstructing left renal pelvic stone as well as smaller stones in the left upper pole.  A percutaneous nephrostomy tube was placed and he was given antibiotics.  He now returns for removal of the stone with percutaneous nephrolithotomy.  FINDINGS AND DESCRIPTION OF PROCEDURE:  He was given Cipro and ceftriaxone.  He was taken to the operating room, where general anesthetic was induced on the holding room stretcher.  A Foley catheter was inserted using sterile technique.  He was then turned prone on chest rolls with all pressure points padded and his arms on arm boards.  The left nephrostomy tube site was prepped with Betadine solution.  He was draped in usual sterile fashion.  The nephrostomy catheter was removed over a wire; however, the wire slipped out during removal but replacement of the wire through the mature tract was not problematic.  A 10-French peel-away sheath was placed over the wire beyond the stone in the proximal ureter.  The inner core was removed and a second wire was placed to  the bladder.  The peel-away sheath was then removed.  The skin incision was opened sufficient to allow the balloon dilation of the tract and a trach master balloon was placed over the working wire into the renal pelvis.  The balloon was dilated to 18 atmospheres and the nephrostomy sheath was advanced over the balloon into the renal pelvis.  The balloon was removed leaving the working wire in place.  The rigid nephroscope was then passed and the stones were identified in the renal pelvis and removed, the largest being the 1.6-cm obstructing stone.  Once these stones were removed, the rigid scope was switched to the flexible scope, and the upper and lower pole areas were inspected.  I was able to get into the lower pole.  No stones were seen.  In the upper pole, I was able to get into 1 infundibulum and I saw a few small stones.  They had been flushed into the renal pelvis.  I was unable to advance the scope fully into the upper pole calyx.  At this point, those residual fragments were removed using the rigid nephroscope and the 3-prong grasper.  Once all the stones were removed, a 22-French Council catheter was placed over the working wire, and the sheath was  removed.  The catheter balloon was filled with 2 cc of fluid and the contrast was instilled.  This revealed no significant extravasation from the collecting system and good filling of the collecting system.  There was a small filling defect in an infundibulum of the upper pole calix, but it appeared more consistent with clot and stone.  No other filling defects were identified.  At this point, a 6-French Kumpe catheter was placed over the safety wire to just above the bladder and the safety wire was removed.  The nephrostomy sheath had been removed prior to the placement Kumpe catheter.  The nephrostomy tube and safety catheter were then secured to the skin with a 2-0 silk suture and the nephrostomy catheter was placed to  straight drainage.  The safety catheter was capped.  The drapes were removed and dressings were applied.  The patient was then rolled back supine on the recovery room stretcher.  His anesthetic was reversed and he was taken to recovery room in stable condition.  There were no complications.     Excell Seltzer. Annabell Howells, M.D.     JJW/MEDQ  D:  02/05/2012  T:  02/05/2012  Job:  [id number]  cc:   Windle Guard, M.D. Fax: [phone]

## 2012-02-05 NOTE — Progress Notes (Signed)
Pt's percutaneous tube is noted to be leaking.  Pt's bed changed and pt repositioned and cleaned up prior to taking over to holding.

## 2012-02-05 NOTE — Transfer of Care (Signed)
Immediate Anesthesia Transfer of Care Note  Patient: Charles Humphrey  Procedure(s) Performed: Procedure(s) (LRB): NEPHROLITHOTOMY PERCUTANEOUS (Left)  Patient Location: PACU  Anesthesia Type: General  Level of Consciousness: awake, unresponsive and pateint uncooperative  Airway & Oxygen Therapy: Patient Spontanous Breathing and Patient connected to face mask oxygen  Post-op Assessment: Report given to PACU RN, Post -op Vital signs reviewed and stable and Patient moving all extremities  Post vital signs: Reviewed and stable  Complications: No apparent anesthesia complications

## 2012-02-05 NOTE — Brief Op Note (Signed)
02/05/2012  10:31 AM  PATIENT:  Charles Humphrey  56 y.o. male  PRE-OPERATIVE DIAGNOSIS:  left renal stone  POST-OPERATIVE DIAGNOSIS:  left renal stone  PROCEDURE:  Procedure(s) (LRB): NEPHROLITHOTOMY PERCUTANEOUS <2cm (Left)  SURGEON:  Surgeon(s) and Role:    * Anner Crete, MD - Primary  PHYSICIAN ASSISTANT:   ASSISTANTS: none   ANESTHESIA:   general  EBL:  Total I/O In: 1000 [I.V.:1000] Out: -   BLOOD ADMINISTERED:none  DRAINS: Urinary Catheter (Foley) and 86fr left nephrostomy tube and 53fr Kompy cath.   LOCAL MEDICATIONS USED:  NONE  SPECIMEN:  Source of Specimen:  Stone  DISPOSITION OF SPECIMEN:  To office to send for analysis.  COUNTS:  YES  TOURNIQUET:  * No tourniquets in log *  DICTATION: .Other Dictation: Dictation Number [id number]  PLAN OF CARE: Admit for overnight observation  PATIENT DISPOSITION:  PACU - hemodynamically stable.   Delay start of Pharmacological VTE agent (>24hrs) due to surgical blood loss or risk of bleeding: yes

## 2012-02-05 NOTE — Preoperative (Signed)
Beta Blockers   Reason not to administer Beta Blockers:Not Applicable 

## 2012-02-05 NOTE — H&P (View-Only) (Signed)
Reason for Consult: Renal stones and sepsis Referring Physician: Dr. Rama  Charles Humphrey is an 55 y.o. male.  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  He was found to have a pneumonia but he also had a CT that showed bilateral renal stones.  There is a 6 mm stone in the right renal pelvis without obstruction.  There is a 1.6cm stone at the left UPJ with an additional smaller stone in the upper pole.  There is inflammation of the collecting system but there is not significant dilation of the system.  His UA has 11-20 WBC's and many bacteria and his WBC count is 27,000.   I have spoken to his father and there is no prior history of stones or UTI's.    Past Medical History  Diagnosis Date  . Ulcerative colitis, unspecified   . Metabolic encephalopathy   . Unspecified intellectual disabilities   . Obesity, unspecified   . Hyperpotassemia   . Other convulsions   . Other lymphedema   . Unspecified constipation   . Other physical therapy   . Muscle weakness (generalized)   . Nephrolithiasis 01/12/2012  . Hydronephrosis 01/12/2012  . Healthcare-associated pneumonia 01/12/2012    No past surgical history on file.  No family history on file.  Social History:  reports that he has never smoked. He does not have any smokeless tobacco history on file. He reports that he does not drink alcohol. His drug history not on file.  He lives in a nursing facility and is non-verbal.    Allergies: No Known Allergies  Medications: I have reviewed the patient's current medications.  Results for orders placed during the hospital encounter of 01/11/12 (from the past 48 hour(s))  COMPREHENSIVE METABOLIC PANEL     Status: Abnormal   Collection Time   01/11/12 11:19 PM      Component Value Range Comment   Sodium 142  135 - 145 (mEq/L)    Potassium 3.7  3.5 - 5.1 (mEq/L)    Chloride 104  96 - 112 (mEq/L)    CO2 25  19 - 32 (mEq/L)    Glucose, Bld  208 (*) 70 - 99 (mg/dL)    BUN 46 (*) 6 - 23 (mg/dL)    Creatinine, Ser 1.79 (*) 0.50 - 1.35 (mg/dL)    Calcium 9.1  8.4 - 10.5 (mg/dL)    Total Protein 7.6  6.0 - 8.3 (g/dL)    Albumin 2.6 (*) 3.5 - 5.2 (g/dL)    AST 11  0 - 37 (U/L)    ALT 10  0 - 53 (U/L)    Alkaline Phosphatase 154 (*) 39 - 117 (U/L)    Total Bilirubin 0.5  0.3 - 1.2 (mg/dL)    GFR calc non Af Amer 42 (*) >90 (mL/min)    GFR calc Af Amer 48 (*) >90 (mL/min)   PHENYTOIN LEVEL, TOTAL     Status: Normal   Collection Time   01/12/12  2:15 AM      Component Value Range Comment   Phenytoin Lvl 16.9  10.0 - 20.0 (ug/mL)   PHENOBARBITAL LEVEL     Status: Normal   Collection Time   01/12/12  2:15 AM      Component Value Range Comment   Phenobarbital 25.7  15.0 - 40.0 (ug/mL)   BASIC METABOLIC PANEL     Status: Abnormal   Collection Time   01/12/12  2:15 AM        Component Value Range Comment   Sodium 141  135 - 145 (mEq/L)    Potassium 3.7  3.5 - 5.1 (mEq/L)    Chloride 104  96 - 112 (mEq/L)    CO2 25  19 - 32 (mEq/L)    Glucose, Bld 225 (*) 70 - 99 (mg/dL)    BUN 46 (*) 6 - 23 (mg/dL)    Creatinine, Ser 1.66 (*) 0.50 - 1.35 (mg/dL)    Calcium 8.7  8.4 - 10.5 (mg/dL)    GFR calc non Af Amer 46 (*) >90 (mL/min)    GFR calc Af Amer 53 (*) >90 (mL/min)   CBC     Status: Abnormal   Collection Time   01/12/12  2:15 AM      Component Value Range Comment   WBC 27.5 (*) 4.0 - 10.5 (K/uL) REPEATED TO VERIFY   RBC 4.47  4.22 - 5.81 (MIL/uL)    Hemoglobin 13.8  13.0 - 17.0 (g/dL)    HCT 42.5  39.0 - 52.0 (%)    MCV 95.1  78.0 - 100.0 (fL)    MCH 30.9  26.0 - 34.0 (pg)    MCHC 32.5  30.0 - 36.0 (g/dL)    RDW 15.5  11.5 - 15.5 (%)    Platelets 190  150 - 400 (K/uL)   MRSA PCR SCREENING     Status: Abnormal   Collection Time   01/12/12  4:03 AM      Component Value Range Comment   MRSA by PCR POSITIVE (*) NEGATIVE    URINALYSIS, ROUTINE W REFLEX MICROSCOPIC     Status: Abnormal   Collection Time   01/12/12  1:34 PM       Component Value Range Comment   Color, Urine YELLOW  YELLOW     APPearance TURBID (*) CLEAR     Specific Gravity, Urine 1.039 (*) 1.005 - 1.030     pH 5.5  5.0 - 8.0     Glucose, UA NEGATIVE  NEGATIVE (mg/dL)    Hgb urine dipstick MODERATE (*) NEGATIVE     Bilirubin Urine NEGATIVE  NEGATIVE     Ketones, ur NEGATIVE  NEGATIVE (mg/dL)    Protein, ur 100 (*) NEGATIVE (mg/dL)    Urobilinogen, UA 0.2  0.0 - 1.0 (mg/dL)    Nitrite NEGATIVE  NEGATIVE     Leukocytes, UA MODERATE (*) NEGATIVE    URINE MICROSCOPIC-ADD ON     Status: Abnormal   Collection Time   01/12/12  1:34 PM      Component Value Range Comment   Squamous Epithelial / LPF FEW (*) RARE     WBC, UA 11-20  <3 (WBC/hpf)    RBC / HPF 3-6  <3 (RBC/hpf)    Bacteria, UA MANY (*) RARE     Casts GRANULAR CAST (*) NEGATIVE     Crystals URIC ACID CRYSTALS (*) NEGATIVE     Urine-Other MUCOUS PRESENT   AMORPHOUS URATES/PHOSPHATES    Ct Abdomen Pelvis W Contrast  01/12/2012  *RADIOLOGY REPORT*  Clinical Data: Fever; ileus or distal obstruction suspected on abdominal radiograph.  Assess for ileus or obstruction.  CT ABDOMEN AND PELVIS WITH CONTRAST  Technique:  Multidetector CT imaging of the abdomen and pelvis was performed following the standard protocol during bolus administration of intravenous contrast.  Contrast: 80mL OMNIPAQUE IOHEXOL 300 MG/ML IV SOLN  Comparison: Abdominal radiograph performed 01/11/2012, and CT of the abdomen and pelvis performed 03/18/2010  Findings: There is partial consolidation of   the right lower lobe; this raises concern for pneumonia, though significant atelectasis could have a similar appearance.  Scattered calcifications at the right lung base likely reflect calcified granulomata.  The liver and spleen are unremarkable in appearance.  The gallbladder is within normal limits.  The pancreas and adrenal glands are unremarkable.  There is a large 1.6 x 1.0 cm stone lodged at the left renal pelvis, with a 0.8 cm  stone noted within a left renal calyx, and scattered smaller left renal stones also seen.  There is suggestion of minimal hydronephrosis, raising concern for some degree of obstruction.  Soft tissue inflammation is noted about the largest stone; mild infection cannot be excluded.  The appearance raises concern for impending development of staghorn calculus, new from the prior study.  A smaller 0.6 cm stone is noted within the right renal pelvis, without significant soft tissue inflammation.  There is also a nonobstructing 6 mm stone at the upper pole of the right kidney. Mild nonspecific perinephric stranding is noted bilaterally.  Delayed imaging demonstrates no evidence of contrast excretion from either kidney; this raises concern for decreased renal function.  No free fluid is identified.  The small bowel is largely decompressed and unremarkable in appearance.  The stomach is within normal limits.  No acute vascular abnormalities are seen.  The appendix is normal in caliber, without evidence for appendicitis.  The ascending and descending colon remain normal in caliber, and there is only mild distension of the transverse colon; there is slightly more distension of the sigmoid colon; this appears to reflect chronic underlying sigmoid inflammation given prior evidence of colitis in 2011.  There is corresponding progressive colonic wall thickening along the distal sigmoid colon and rectum, likely reflecting the prior inflammatory process.  No distal obstruction is identified. Findings are not particularly suggestive of ileus.  A 1.3 cm soft tissue cyst along the mid back pain most likely benign in nature, given its appearance.  The bladder is mildly distended and grossly unremarkable in appearance.  The prostate is normal in size, with scattered calcification.  No inguinal lymphadenopathy is seen.  No acute osseous abnormalities are identified.  IMPRESSION:  1.  No evidence of distal obstruction; colonic distension  involves predominantly the sigmoid colon, and is thought to reflect underlying chronic sequelae of prior colitis.  No definite evidence of ileus.  Mild colonic wall thickening along the distal sigmoid colon and rectum is significantly improved from 2011, likely reflects chronic underlying inflammation. 2.  Large 1.6 x 1.0 cm stone lodged at the left renal pelvis, with additional smaller left renal stones.  Soft tissue inflammation about this largest stone, with suggestion of minimal hydronephrosis, raising concern for some degree of obstruction. Infection cannot be excluded, given soft tissue inflammation; the appearance raises concern for impending development of staghorn calculus, new from the prior study. 3.  Smaller 0.6 cm stone at the right renal pelvis; additional nonobstructing right renal stones seen. 4.  No evidence of contrast excretion from either kidney on delayed images, raising concern for bilaterally decreased renal function. 5.  Partial consolidation of the right lower lung lobe, concerning for pneumonia, though significant atelectasis could have a similar appearance.  Original Report Authenticated By: JEFFREY CHANG, M.D.   Dg Abd Acute W/chest  01/11/2012  *RADIOLOGY REPORT*  Clinical Data: Fever.  Abdominal distention.  ACUTE ABDOMEN SERIES (ABDOMEN 2 VIEW & CHEST 1 VIEW)  Comparison: Chest dated 03/23/2010 and abdomen and pelvis CT dated 03/18/2010.  Findings: Interval dense   opacity in the right mid and upper lung zones.  Clear left lung.  Grossly stable enlarged cardiac silhouette.  Prominent gas-filled redundant colon throughout the abdomen.  Small amount of fluid in the bowel loops in the decubitus position.  No free peritoneal air.  Diffuse osteopenia.  IMPRESSION:  1.  Dense right mid and upper lung zone pneumonia.  Follow-up chest radiographs are recommended until clearing to exclude an underlying mass. 2.  Stable cardiomegaly. 3.  Diffuse colonic ileus or distal obstruction.  Original  Report Authenticated By: STEVEN H. REID, M.D.    Review of Systems  Unable to perform ROS: patient nonverbal   Blood pressure 125/81, pulse 130, temperature 99 F (37.2 C), temperature source Axillary, resp. rate 22, SpO2 90.00%. Physical Exam  Constitutional:       He has severe MR and is non-verbal.  He has contracted lower extremities.  Cardiovascular: Regular rhythm.  Tachycardia present.   Respiratory: Not tachypneic and not bradypneic. No respiratory distress.  GI: Soft. Normal appearance. There is no tenderness.    Assessment/Plan: He has sepsis with bilateral renal stones with a large left UPJ stone with inflammation and probable obstruction despite the lack of hydro.   He also has an apparent pneumonia.  With the 1.6cm stone in the left kidney along with a second stone, he will be best served by percutaneous nephrostomy tube placement in the near future followed by a left percutaneous nephrolithotomy on an elective basis.    He will require close f/u and probable treatment of the right renal stones as well, but that might be done with ureteroscopy at a later date.  I have spoken with Dr. Rama and Dr. Haas about this patient.  Takenya Travaglini J 01/12/2012, 4:53 PM      

## 2012-02-06 ENCOUNTER — Observation Stay (HOSPITAL_COMMUNITY): Payer: Medicare Other

## 2012-02-06 MED ORDER — HYDROCODONE-ACETAMINOPHEN 5-325 MG PO TABS: 1.0000 | ORAL_TABLET | Freq: Four times a day (QID) | ORAL | Status: AC | PRN

## 2012-02-06 NOTE — Progress Notes (Signed)
1 Day Post-Op Subjective: Patient is non verbal.  He appears comfortable and is awake/alert.  Objective: Vital signs in last 24 hours: Temp:  [97.3 F (36.3 C)-98.7 F (37.1 C)] 97.5 F (36.4 C) (03/15 0600) Pulse Rate:  [71-122] 122  (03/15 0600) Resp:  [14-23] 20  (03/15 0600) BP: (130-150)/(81-106) 140/86 mmHg (03/15 0600) SpO2:  [93 %-100 %] 93 % (03/15 0600) Weight:  [84 kg (185 lb 3 oz)] 84 kg (185 lb 3 oz) (03/14 1300)  Intake/Output from previous day: 03/14 0701 - 03/15 0700 In: 2846.7 [I.V.:2846.7] Out: 2490 [Urine:2490] Intake/Output this shift: Total I/O In: 360 [P.O.:360] Out: -   Physical Exam:  General:alert and no distress Cardiac: RRR Lungs: CTA GI: soft; NT; ND Perc tube in place with bloody discharge    Lab Results:  [facility] 02/05/12 0715  HGB 13.1  HCT 40.6   BMET  Basename 02/05/12 0715  NA 140  K 4.3  CL 107  CO2 25  GLUCOSE 102*  BUN 16  CREATININE 0.53  CALCIUM 8.6   No results found for this basename: LABPT:3,INR:3 in the last 72 hours No results found for this basename: LABURIN:1 in the last 72 hours Results for orders placed during the hospital encounter of 02/05/12  SURGICAL PCR SCREEN     Status: Normal   Collection Time   02/05/12  6:36 AM      Component Value Range Status Comment   MRSA, PCR NEGATIVE  NEGATIVE  Final    Staphylococcus aureus NEGATIVE  NEGATIVE  Final     Studies/Results: Ct Abdomen Pelvis Wo Contrast  02/06/2012  *RADIOLOGY REPORT*  Clinical Data: Follow-up following percutaneous nephrolithotomy for left-sided renal stone.  CT ABDOMEN AND PELVIS WITHOUT CONTRAST  Technique:  Multidetector CT imaging of the abdomen and pelvis was performed following the standard protocol without intravenous contrast.  Comparison: CT of abdomen and pelvis 01/12/2012.  Findings: To  Lung Bases: Evaluation of the lung bases is limited by respiratory motion.  Probable subsegmental atelectasis and/or scarring in the lower lobes  of the lungs bilaterally.  Abdomen/Pelvis:  There is a left-sided ureteral stent extending down to the left ureterovesicular junction.  A left-sided nephrostomy tube is in place with tip within the left renal collecting system.  Multiple locules of gas are present within the collecting system of the left kidney.  Several tiny nonobstructive calculi are noted within the collecting system of the left kidney. No definite calculi along the course of the left ureter.  Within the right kidney there are two nonobstructive calculi measuring up to 6 mm in length.  No right ureteral calculi are identified.  The left lobe of the liver is diminutive.  The unenhanced appearance of the liver is otherwise unremarkable.  The gallbladder, pancreas, spleen and bilateral adrenal glands are unremarkable in appearance.  Normal appendix.  No ascites or pneumoperitoneum and no definite pathologic distension of bowel. No definite pathologic adenopathy appreciated within the abdomen or pelvis.  A Foley balloon catheter is present within the lumen of the urinary bladder.  Large amount of gas is noted non dependently within the urinary bladder, presumably secondary to the indwelling Foley.  Musculoskeletal: There are no aggressive appearing lytic or blastic lesions noted in the visualized portions of the skeleton.  IMPRESSION: 1.  Post procedural changes of left sided nephrostomy tube placement and left-sided ureteral stent placement.  There are multiple nonobstructive calculi within the collecting systems of the kidneys bilaterally.  No definite ureteral calculi noted  at this time, and no signs of urinary tract obstruction. 2.  No definite acute findings within the abdomen or pelvis. 3.  Foley catheter in place within the lumen of the urinary bladder.  Original Report Authenticated By: Florencia Reasons, M.D.   Dg Chest 2 View  02/05/2012  *RADIOLOGY REPORT*  Clinical Data: Preop nephrolithotomy, recent pneumonia  CHEST - 2 VIEW   Comparison: 03/23/2010  Findings: Low lung volumes with vascular crowding.  Mild patchy right midlung opacity, possibly reflecting resolving pneumonia. No pleural effusion or pneumothorax.  The heart is top normal in size for inspiration.  IMPRESSION: Low lung volumes with vascular crowding.  Mild patchy right midlung opacity, possibly reflecting resolving pneumonia.  Original Report Authenticated By: Charline Bills, M.D.   Dg C-arm 1-60 Min-no Report  02/05/2012  CLINICAL DATA: percutaneous nephrolithotomy   C-ARM 1-60 MINUTES  Fluoroscopy was utilized by the requesting physician.  No radiographic  interpretation.      Assessment/Plan: 1 Day Post-Op Procedure(s) (LRB): NEPHROLITHOTOMY PERCUTANEOUS (Left)  Pt is doing well POD 1  Dr. Annabell Howells has reviewed the CT scan and gave instructions for the perc tube to be removed.  Perc tube and stent removed without difficulty.  The 2.23mm fragment noted in the left kidney does not require additional treatment at this time.  Pt is stable to return to SNF today    LOS: 1 day   YARBROUGH,Oshay Stranahan G. 02/06/2012, 9:09 AM

## 2012-02-06 NOTE — Progress Notes (Signed)
CSW assisting with d/c planning. Pt is from [facility] and is able to return there today. Met with pt's parents to review d/c plan. They were happy that pt is able to return to [facility] today. Will assist with d/c planning to SNF.  Cori Razor  LCSW  [phone]

## 2012-02-06 NOTE — Discharge Summary (Signed)
Date of admission: 02/05/2012  Date of discharge: 02/06/2012  Admission diagnosis: left nephrolithiasis  Discharge diagnosis: same  Secondary diagnoses: mental retardation, metabolic encephalopathy, epilepsy, pneumonia, C. Difficile colitis, leukocytosis, and acute renal failure   History and Physical:  For full details, please see admission history and physical. Briefly, Charles Humphrey is a 56 y.o. year old patient that presented in Feb 2013 with sepsis, bilateral renal stones with a large left UPJ stone with inflammation and probable obstruction despite the lack of hydro. He also had pneumonia.  With the 1.6cm stone in the left kidney along with a second stone, he will be best served by percutaneous nephrostomy tube placement followed by a left percutaneous nephrolithotomy on an elective basis.    Hospital Course:  Pt was admitted and taken to the OR on 02/05/12 for a left percutaneous nephrolithotomy by Dr. Annabell Howells.  Pt tolerated the procedure well and was hemodynamically stable immediately post op.  He was transferred to the floor without difficulty. He has remained afebrile with stable vitals signs with the exception of some sinus tachycardia.  CT scan performed on 02/06/12 revealed a non obstructing 2.14mm stone fragment in the left kidney which does not require further treatment.  It also shows that the 6mm right renal stone is now in a posterior calyx which does not require intervention at this time.  Left perc tube was removed without difficulty.  Pt is in stable condition and felt ready for discharge back the SNF.     Laboratory values:  Basename 02/05/12 0715  HGB 13.1  HCT 40.6    Basename 02/05/12 0715  CREATININE 0.53    Disposition: Home  Discharge instruction: The patient was instructed to be ambulatory but told to refrain from heavy lifting, strenuous activity, or driving. Clean, dry dressings will need to be applied to the drain site and changed prn  saturation.  Discharge medications:  Medication List  As of 02/06/2012  9:23 AM   START taking these medications         HYDROcodone-acetaminophen 5-325 MG per tablet   Commonly known as: NORCO   Take 1-2 tablets by mouth every 6 (six) hours as needed.         CONTINUE taking these medications         albuterol (2.5 MG/3ML) 0.083% nebulizer solution   Commonly known as: PROVENTIL      feeding supplement Liqd   Take 237 mLs by mouth 3 (three) times daily with meals.      guaiFENesin 600 MG 12 hr tablet   Commonly known as: MUCINEX      lactulose 10 GM/15ML solution   Commonly known as: CHRONULAC      * PHENObarbital 100 MG tablet   Commonly known as: LUMINAL      * PHENObarbital 60 MG tablet   Commonly known as: LUMINAL      phenytoin 125 MG/5ML suspension   Commonly known as: DILANTIN      potassium chloride 10 MEQ tablet   Commonly known as: K-DUR     * Notice: This list has 2 medication(s) that are the same as other medications prescribed for you. Read the directions carefully, and ask your doctor or other care provider to review them with you.       STOP taking these medications         VANCOMYCIN HCL PO          Where to get your medications    These are  the prescriptions that you need to pick up.   You may get these medications from any pharmacy.         HYDROcodone-acetaminophen 5-325 MG per tablet            Followup:  Follow-up Information    Follow up with Anner Crete, MD on 02/16/2012. (at 2:15)    Contact information:   [address] [place] [address] [place] [address] [phone]

## 2012-02-18 ENCOUNTER — Encounter (HOSPITAL_COMMUNITY): Payer: Self-pay | Admitting: Urology

## 2012-05-21 ENCOUNTER — Other Ambulatory Visit: Payer: Self-pay

## 2012-05-21 ENCOUNTER — Encounter (HOSPITAL_COMMUNITY): Payer: Self-pay | Admitting: Emergency Medicine

## 2012-05-21 ENCOUNTER — Other Ambulatory Visit (HOSPITAL_COMMUNITY): Payer: Self-pay | Admitting: Family Medicine

## 2012-05-21 ENCOUNTER — Ambulatory Visit (HOSPITAL_COMMUNITY)
Admission: RE | Admit: 2012-05-21 | Discharge: 2012-05-21 | Disposition: A | Discharge status: Home / Self Care | Payer: Medicare Other | Source: Ambulatory Visit | Attending: Family Medicine | Admitting: Family Medicine

## 2012-05-21 ENCOUNTER — Emergency Department (HOSPITAL_COMMUNITY)
Admission: EM | Admit: 2012-05-21 | Discharge: 2012-05-21 | Disposition: A | Discharge status: Home / Self Care | Payer: Medicare Other | Attending: Emergency Medicine | Admitting: Emergency Medicine

## 2012-05-21 DIAGNOSIS — Z79899 Other long term (current) drug therapy: Secondary | ICD-10-CM | POA: Insufficient documentation

## 2012-05-21 DIAGNOSIS — R109 Unspecified abdominal pain: Secondary | ICD-10-CM | POA: Insufficient documentation

## 2012-05-21 DIAGNOSIS — N200 Calculus of kidney: Secondary | ICD-10-CM | POA: Insufficient documentation

## 2012-05-21 DIAGNOSIS — N390 Urinary tract infection, site not specified: Secondary | ICD-10-CM

## 2012-05-21 DIAGNOSIS — K560 Paralytic ileus: Secondary | ICD-10-CM | POA: Insufficient documentation

## 2012-05-21 DIAGNOSIS — N209 Urinary calculus, unspecified: Secondary | ICD-10-CM | POA: Insufficient documentation

## 2012-05-21 DIAGNOSIS — K3189 Other diseases of stomach and duodenum: Secondary | ICD-10-CM | POA: Insufficient documentation

## 2012-05-21 DIAGNOSIS — K56609 Unspecified intestinal obstruction, unspecified as to partial versus complete obstruction: Secondary | ICD-10-CM | POA: Insufficient documentation

## 2012-05-21 DIAGNOSIS — K567 Ileus, unspecified: Secondary | ICD-10-CM

## 2012-05-21 DIAGNOSIS — E669 Obesity, unspecified: Secondary | ICD-10-CM | POA: Insufficient documentation

## 2012-05-21 LAB — COMPREHENSIVE METABOLIC PANEL
ALT: <5 U/L (ref 0–53)
Albumin: 2.5 g/dL — ABNORMAL LOW (ref 3.5–5.2)
Alkaline Phosphatase: 110 U/L (ref 39–117)
Potassium: 3.5 mEq/L (ref 3.5–5.1)
Sodium: 148 mEq/L — ABNORMAL HIGH (ref 135–145)
Total Protein: 7.4 g/dL (ref 6.0–8.3)

## 2012-05-21 LAB — CBC WITH DIFFERENTIAL/PLATELET
Basophils Relative: 0 % (ref 0–1)
Eosinophils Absolute: 0.4 10*3/uL (ref 0.0–0.7)
MCH: 30.9 pg (ref 26.0–34.0)
MCHC: 32.2 g/dL (ref 30.0–36.0)
Neutrophils Relative %: 71 % (ref 43–77)
Platelets: 241 10*3/uL (ref 150–400)
RDW: 14.5 % (ref 11.5–15.5)

## 2012-05-21 LAB — URINE MICROSCOPIC-ADD ON

## 2012-05-21 LAB — URINALYSIS, ROUTINE W REFLEX MICROSCOPIC
Nitrite: NEGATIVE
Specific Gravity, Urine: >1.046 — ABNORMAL HIGH (ref 1.005–1.030)
pH: 5 (ref 5.0–8.0)

## 2012-05-21 LAB — LIPASE, BLOOD: Lipase: 17 U/L (ref 11–59)

## 2012-05-21 MED ORDER — SODIUM CHLORIDE 0.9 % IV BOLUS (SEPSIS)
1000.0000 mL | Freq: Once | INTRAVENOUS | Status: AC
  Administered 2012-05-21: 1000 mL via INTRAVENOUS

## 2012-05-21 MED ORDER — LEVOFLOXACIN IN D5W 750 MG/150ML IV SOLN
750.0000 mg | INTRAVENOUS | Status: DC
  Administered 2012-05-21: 750 mg via INTRAVENOUS
  Filled 2012-05-21: qty 150

## 2012-05-21 MED ORDER — DOCUSATE SODIUM 100 MG PO CAPS: 100.0000 mg | ORAL_CAPSULE | Freq: Two times a day (BID) | ORAL | Status: AC

## 2012-05-21 MED ORDER — HYDROCODONE-ACETAMINOPHEN 5-325 MG PO TABS: 1.0000 | ORAL_TABLET | Freq: Four times a day (QID) | ORAL | Status: AC | PRN

## 2012-05-21 MED ORDER — LEVOFLOXACIN 750 MG PO TABS: 750.0000 mg | ORAL_TABLET | Freq: Every day | ORAL | Status: AC

## 2012-05-21 MED ORDER — IOHEXOL 300 MG/ML  SOLN
100.0000 mL | Freq: Once | INTRAMUSCULAR | Status: AC | PRN
  Administered 2012-05-21: 100 mL via INTRAVENOUS

## 2012-05-21 MED ORDER — DEXTROSE 5 % IV SOLN: 1.0000 g | Freq: Once | INTRAVENOUS | Status: DC

## 2012-05-21 MED ORDER — TAMSULOSIN HCL 0.4 MG PO CAPS: 0.4000 mg | ORAL_CAPSULE | Freq: Every day | ORAL | Status: DC

## 2012-05-21 NOTE — ED Notes (Addendum)
Pt from [facility]  Brought by [facility]  To have a CT  Of the abd r/o  obstruction Ct completed to ED for evaluation

## 2012-05-21 NOTE — Discharge Instructions (Signed)
Ureteral Colic (Kidney Stones) Ureteral colic is the result of a condition when kidney stones form inside the kidney. Once kidney stones are formed they may move into the tube that connects the kidney with the bladder (ureter). If this occurs, this condition may cause pain (colic) in the ureter.  CAUSES  Pain is caused by stone movement in the ureter and the obstruction caused by the stone. SYMPTOMS  The pain comes and goes as the ureter contracts around the stone. The pain is usually intense, sharp, and stabbing in character. The location of the pain may move as the stone moves through the ureter. When the stone is near the kidney the pain is usually located in the back and radiates to the belly (abdomen). When the stone is ready to pass into the bladder the pain is often located in the lower abdomen on the side the stone is located. At this location, the symptoms may mimic those of a urinary tract infection with urinary frequency. Once the stone is located here it often passes into the bladder and the pain disappears completely. TREATMENT   Your caregiver will provide you with medicine for pain relief.   You may require specialized follow-up X-rays.   The absence of pain does not always mean that the stone has passed. It may have just stopped moving. If the urine remains completely obstructed, it can cause loss of kidney function or even complete destruction of the involved kidney. It is your responsibility and in your interest that X-rays and follow-ups as suggested by your caregiver are completed. Relief of pain without passage of the stone can be associated with severe damage to the kidney, including loss of kidney function on that side.   If your stone does not pass on its own, additional measures may be taken by your caregiver to ensure its removal.  HOME CARE INSTRUCTIONS   Increase your fluid intake. Water is the preferred fluid since juices containing vitamin C may acidify the urine making  it less likely for certain stones (uric acid stones) to pass.   Strain all urine. A strainer will be provided. Keep all particulate matter or stones for your caregiver to inspect.   Take your pain medicine as directed.   Make a follow-up appointment with your caregiver as directed.   Remember that the goal is passage of your stone. The absence of pain does not mean the stone is gone. Follow your caregiver's instructions.   Only take over-the-counter or prescription medicines for pain, discomfort, or fever as directed by your caregiver.  SEEK MEDICAL CARE IF:   Pain cannot be controlled with the prescribed medicine.   You have a fever.   Pain continues for longer than your caregiver advises it should.   There is a change in the pain, and you develop chest discomfort or constant abdominal pain.   You feel faint or pass out.  MAKE SURE YOU:   Understand these instructions.   Will watch your condition.   Will get help right away if you are not doing well or get worse.  Document Released: 08/20/2005 Document Revised: 10/30/2011 Document Reviewed: 05/07/2011 [facility] Patient Information 2012 [place], [place].  Urinary Tract Infection Infections of the urinary tract can start in several places. A bladder infection (cystitis), a kidney infection (pyelonephritis), and a prostate infection (prostatitis) are different types of urinary tract infections (UTIs). They usually get better if treated with medicines (antibiotics) that kill germs. Take all the medicine until it is gone. You  or your child may feel better in a few days, but TAKE ALL MEDICINE or the infection may not respond and may become more difficult to treat. HOME CARE INSTRUCTIONS   Drink enough water and fluids to keep the urine clear or pale yellow. Cranberry juice is especially recommended, in addition to large amounts of water.   Avoid caffeine, tea, and carbonated beverages. They tend to irritate the bladder.   Alcohol may  irritate the prostate.   Only take over-the-counter or prescription medicines for pain, discomfort, or fever as directed by your caregiver.  To prevent further infections:  Empty the bladder often. Avoid holding urine for long periods of time.   After a bowel movement, women should cleanse from front to back. Use each tissue only once.   Empty the bladder before and after sexual intercourse.  FINDING OUT THE RESULTS OF YOUR TEST Not all test results are available during your visit. If your or your child's test results are not back during the visit, make an appointment with your caregiver to find out the results. Do not assume everything is normal if you have not heard from your caregiver or the medical facility. It is important for you to follow up on all test results. SEEK MEDICAL CARE IF:   There is back pain.   Your baby is older than 3 months with a rectal temperature of 100.5 F (38.1 C) or higher for more than 1 day.   Your or your child's problems (symptoms) are no better in 3 days. Return sooner if you or your child is getting worse.  SEEK IMMEDIATE MEDICAL CARE IF:   There is severe back pain or lower abdominal pain.   You or your child develops chills.   You have a fever.   Your baby is older than 3 months with a rectal temperature of 102 F (38.9 C) or higher.   Your baby is 67 months old or younger with a rectal temperature of 100.4 F (38 C) or higher.   There is nausea or vomiting.   There is continued burning or discomfort with urination.  MAKE SURE YOU:   Understand these instructions.   Will watch your condition.   Will get help right away if you are not doing well or get worse.  Document Released: 08/20/2005 Document Revised: 10/30/2011 Document Reviewed: 03/25/2007 [facility] Patient Information 2012 [place], [place].  Ileus The intestine (bowel, or gut) is a long muscular tube connecting your stomach to your rectum. If the intestine stops working, food  cannot pass through. This is called an ileus. This can happen for a variety of reasons. Ileus is a major medical problem that usually requires hospitalization. If your intestine stops working because of a blockage, this is called a bowel obstruction, and is a different condition. CAUSES   Surgery in your abdomen. This can last from a few hours to a few days.   An infection or inflammation in the belly (abdomen). This includes inflammation of the lining of the abdomen (peritonitis).   Infection or inflammation in other parts of the body, such as pneumonia or pancreatitis.   Passage of gallstones or kidney stones.   Damage to the nerves or blood vessels which go to the bowel.   Imbalance in the salts in the blood (electrolytes).   Injury to the brain and or spinal cord.   Medications. Many medications can cause ileus or make it worse. The most common of these are strong pain medications.  SYMPTOMS  Symptoms of bowel obstruction come from the bowel inactivity. They may include:  Bloating. Your belly gets bigger (distension).   Pain or discomfort in the abdomen.   Poor appetite, feeling sick to your stomach (nausea) and vomiting.   You may also not be able to hear your normal bowel sounds, such as "growling" in your stomach.  DIAGNOSIS   Your history and a physical exam will usually suggest to your caregiver that you have an ileus.   X-rays or a CT scan of your abdomen will confirm the diagnosis. X-rays, CT scans and lab tests may also suggest the cause.  TREATMENT   Rest the intestine until it starts working again. This is most often accomplished by:   Stopping intake of oral food and drink. Dehydration is prevented by using IV (intravenous) fluids.   Sometimes, a naso-gastric tube (NG tube) is needed. This is a narrow plastic tube inserted through your nose and into your stomach. It is connected to suction to keep the stomach emptied out. This also helps treat the nausea and  vomiting.   If there is an imbalance in the electrolytes, they are corrected with supplements in your intravenous fluids.   Medications that might make an ileus worse might be stopped.   There are no medications that reliably treat ileus, though your caregiver may suggest a trial of certain medications.   If your condition is slow to resolve, you will be re-evaluated to be sure another condition, such as a blockage, is not present.  Ileus is common and usually has a good outcome. Depending on cause of your ileus, it usually can be treated by your caregivers with good results. Sometimes, specialists (surgeons or gastroenterologists) are asked to assist in your care.  HOME CARE INSTRUCTIONS   Follow your caregiver's instructions regarding diet and fluid intake. This will usually include drinking plenty of clear fluids, avoiding alcohol and caffeine, and eating a gentle diet.   Follow your caregiver's instructions regarding activity. A period of rest is sometimes advised before returning to work or school.   Take only medications prescribed by your caregiver. Be especially careful with narcotic pain medication, which can slow your bowel activity and contribute to ileus.   Keep any follow-up appointments with your caregiver or specialists.  SEEK MEDICAL CARE IF:   You have a recurrence of nausea, vomiting or abdominal discomfort.   You develop fever of more than 102 F (38.9 C).  SEEK IMMEDIATE MEDICAL CARE IF:   You have severe abdominal pain.   You are unable to keep fluids down.  Document Released: 11/13/2003 Document Revised: 10/30/2011 Document Reviewed: 03/15/2009 [facility] Patient Information 2012 [place], [place].   RESOURCE GUIDE  Dental Problems  Patients with Medicaid: [facility] [phone] [address]                                           [phone] [address] Phone:  [phone]                                                    Phone:  [phone]  If unable to pay or uninsured, contact:  [facility] or [facility]. to become qualified for the adult dental clinic.  Chronic Pain Problems Contact [facility]  [phone] Patients need to be referred by their primary care doctor.  Insufficient Money for Medicine Contact United Way:  call "211" or [facility] [phone].  No Primary Care Doctor Call [facility]  [phone] Other agencies that provide inexpensive medical care    [facility]  [phone]    [facility]  [phone]    [facility]  [phone]    [facility]  [phone]    [facility]  [phone]    [facility]  [phone]  Psychological Services [facility]  [phone] [facility]  [phone] [facility]   [phone] (emergency services [phone])  Abuse/Neglect [facility] Child Abuse Hotline 228-557-6094 [facility] Child Abuse Hotline [phone] (After Hours)  Emergency Shelter [facility] [phone]  Maternity Homes [facility] [phone] [facility] [phone]  MRSA Hotline #:   956-415-4624    [facility] Resources  [facility]  United [employer]                           [facility] Dept. [address] [place]                     [address]         [address]  Blondell Reveal Phone:  [phone]                                  Phone:  [phone]                   Phone:  [phone]  [facility] Phone:  [phone]  [facility] Child Abuse Hotline [phone] (After Hours)

## 2012-05-21 NOTE — ED Provider Notes (Signed)
History     CSN: [id number]  Arrival date & time 05/21/12  1315   First MD Initiated Contact with Patient 05/21/12 1506      Chief Complaint  Patient presents with  . Abdominal Pain   Level 5 caveat AMS  (Consider location/radiation/quality/duration/timing/severity/associated sxs/prior treatment) HPI  H/o metabolic encephalopathy, MR, seizure d/o reports decreased appetite x one week.  Taking sips of liquid per day. Denies known fever. No skin breakdown. Was recently diagnosed with UTI recently and unk if he has been taking Bactrim per family. Has been compliant with dilantin and phenobarbital. Previous nephrostomy tubes placed for renal stone in past (was septic during that admission), since removed. CT scan prior to ED visit today showed 7mm left renal stone, ileus without obstruction. He does not appear to be in pain per family. Pt baseline MR and does not communicate.   Past Medical History  Diagnosis Date  . Ulcerative colitis, unspecified   . Metabolic encephalopathy   . Unspecified intellectual disabilities   . Obesity, unspecified   . Hyperpotassemia   . Other convulsions   . Other lymphedema   . Unspecified constipation   . Other physical therapy   . Muscle weakness (generalized)   . Nephrolithiasis 01/12/2012  . Hydronephrosis 01/12/2012  . Healthcare-associated pneumonia 01/12/2012    Past Surgical History  Procedure Date  . Nephrolithotomy 02/05/2012    Procedure: NEPHROLITHOTOMY PERCUTANEOUS;  Surgeon: Anner Crete, MD;  Location: [facility];  Service: Urology;  Laterality: Left;  left kidney    No family history on file.  History  Substance Use Topics  . Smoking status: Never Smoker   . Smokeless tobacco: Not on file  . Alcohol Use: No    Review of Systems  Unable to perform ROS: Other    Allergies  Review of patient's allergies indicates no known allergies.  Home Medications   Current Outpatient Rx  Name Route Sig Dispense Refill  . ALBUTEROL  SULFATE (2.5 MG/3ML) 0.083% IN NEBU Nebulization Take 2.5 mg by nebulization every 6 (six) hours as needed.    . GUAIFENESIN ER 600 MG PO TB12 Oral Take 1,200 mg by mouth 2 (two) times daily as needed.     Marland Kitchen LACTULOSE 10 GM/15ML PO SOLN Oral Take 30 g by mouth every morning.     Marland Kitchen PHENOBARBITAL 100 MG PO TABS Oral Take 100 mg by mouth every morning. Takes along with a 60mg  tablet for a total of 160mg     . PHENOBARBITAL 60 MG PO TABS Oral Take 60 mg by mouth every morning. Takes along with a 100mg  tablet for a total dosing of 160mg     . PHENYTOIN 125 MG/5ML PO SUSP Oral Take 75 mg by mouth every 12 (twelve) hours.     Marland Kitchen POTASSIUM CHLORIDE ER 10 MEQ PO TBCR Oral Take 20 mEq by mouth every morning.     Marland Kitchen FLEET PEDIATRIC 3.5-9.5 GM/59ML RE ENEM Rectal Place 1 enema rectally as needed. bm    . SULFAMETHOXAZOLE-TMP DS 800-160 MG PO TABS Oral Take 1 tablet by mouth every 12 (twelve) hours.    . DOCUSATE SODIUM 100 MG PO CAPS Oral Take 1 capsule (100 mg total) by mouth every 12 (twelve) hours. 60 capsule 0  . HYDROCODONE-ACETAMINOPHEN 5-325 MG PO TABS Oral Take 1 tablet by mouth every 6 (six) hours as needed for pain. 10 tablet 0  . LEVOFLOXACIN 750 MG PO TABS Oral Take 1 tablet (750 mg total) by mouth daily. 5 tablet  0  . TAMSULOSIN HCL 0.4 MG PO CAPS Oral Take 1 capsule (0.4 mg total) by mouth daily. 10 capsule 0    BP 125/82  Pulse 105  Temp 98 F (36.7 C) (Axillary)  Resp 20  SpO2 97%  Physical Exam  Nursing note and vitals reviewed. Constitutional: He is oriented to person, place, and time. He appears well-developed and well-nourished. No distress.  HENT:  Head: Atraumatic.       Mm dry  Eyes: Conjunctivae are normal. Pupils are equal, round, and reactive to light.  Neck: Neck supple.  Cardiovascular: Normal rate, regular rhythm, normal heart sounds and intact distal pulses.  Exam reveals no gallop and no friction rub.   No murmur heard. Pulmonary/Chest: Effort normal. No respiratory  distress. He has no wheezes. He has no rales.  Abdominal: Soft. Bowel sounds are normal. He exhibits no distension. There is no tenderness. There is no rebound and no guarding.  Genitourinary: Penis normal.  Musculoskeletal: Normal range of motion. He exhibits no edema and no tenderness.  Neurological: He is alert and oriented to person, place, and time.  Skin: Skin is warm and dry.  Psychiatric: He has a normal mood and affect.    ED Course  Procedures (including critical care time)  Labs Reviewed  COMPREHENSIVE METABOLIC PANEL - Abnormal; Notable for the following:    Sodium 148 (*)     Albumin 2.5 (*)     Total Bilirubin 0.2 (*)     All other components within normal limits  URINALYSIS, ROUTINE W REFLEX MICROSCOPIC - Abnormal; Notable for the following:    APPearance CLOUDY (*)     Specific Gravity, Urine >1.046 (*)     Hgb urine dipstick LARGE (*)     Bilirubin Urine SMALL (*)     Ketones, ur 15 (*)     Protein, ur 30 (*)     Leukocytes, UA SMALL (*)     All other components within normal limits  URINE MICROSCOPIC-ADD ON - Abnormal; Notable for the following:    Bacteria, UA MANY (*)     All other components within normal limits  CBC WITH DIFFERENTIAL  LIPASE, BLOOD  PHENOBARBITAL LEVEL  PHENYTOIN LEVEL, FREE  URINE CULTURE   Ct Abdomen Pelvis W Contrast  05/21/2012  *RADIOLOGY REPORT*  Clinical Data: Severe abdominal pain.  Urolithiasis.  CT ABDOMEN AND PELVIS WITH CONTRAST  Technique:  Multidetector CT imaging of the abdomen and pelvis was performed following the standard protocol during bolus administration of intravenous contrast.  Contrast: OMNIPAQUE IOHEXOL 300 MG/ML  SOLN  Comparison: 02/06/2012  Findings: Now seen is a 7 mm calculus at the right ureteral pelvic junction causing mild right hydronephrosis.  Several nonobstructing intrarenal calculi are seen bilaterally measuring less than 1 cm in size.  No evidence of renal mass or abscess.  No evidence of distal  ureteral calculi or dilatation.  Unopacified urinary bladder is unremarkable appearance.  The other abdominal parenchymal organs are normal appearance.  No soft tissue masses or lymphadenopathy identified.  No evidence of focal inflammatory process or abnormal fluid collections.  Gaseous distention of the nondependent sigmoid and transverse colon and stomach is seen, but there is no evidence of bowel obstruction.  IMPRESSION:  1.  7 mm obstructing calculus at the right ureteral pelvic junction causing mild right hydronephrosis. 2.  Nonobstructing bilateral nephrolithiasis. 3.  Gaseous distention of the stomach and on the in the colon, suspicious for mild ileus.  No evidence of  bowel obstruction or other anatomic etiology.  Original Report Authenticated By: Danae Orleans, M.D.    1. Ileus   2. Renal stone   3. UTI (lower urinary tract infection)     MDM  Renal stone, UTI. Given levaquin in the Emergency Department. Tolerating PO prior to arrival. He will go home with oral abx to f/u with Dr. Annabell Howells. D/W Urology on call who agrees with plan. Family updated on plan. Sinus tachycardia noted during previous admissions, baseline. No EMC precluding discharge at this time. Given Precautions for return. PMD f/u.    BP 125/82  Pulse 105  Temp 98 F (36.7 C) (Axillary)  Resp 20  SpO2 97%      Forbes Cellar, MD 05/21/12 2047

## 2012-05-21 NOTE — ED Notes (Signed)
PTAR CALLED  °

## 2012-05-21 NOTE — ED Notes (Signed)
[id number]<AV> Expected date:05/21/12<BR> Expected time:<BR> Means of arrival:<BR> Comments:<BR> M120 - 89yof - decreased appetite

## 2012-05-23 LAB — URINE CULTURE: Colony Count: 15000

## 2012-05-25 LAB — PHENYTOIN LEVEL, FREE AND TOTAL
Phenytoin Bound: 14.7 mg/L
Phenytoin, Free: 2.2 mg/L — ABNORMAL HIGH (ref 1.0–2.0)
Phenytoin, Total: 16.9 mg/L (ref 10.0–20.0)

## 2012-06-01 ENCOUNTER — Other Ambulatory Visit (HOSPITAL_COMMUNITY): Payer: Medicare Other

## 2012-06-01 ENCOUNTER — Other Ambulatory Visit (HOSPITAL_COMMUNITY): Payer: Self-pay | Admitting: Adult Health

## 2012-06-01 DIAGNOSIS — N200 Calculus of kidney: Secondary | ICD-10-CM

## 2012-06-03 ENCOUNTER — Other Ambulatory Visit: Payer: Self-pay | Admitting: Urology

## 2012-06-03 DIAGNOSIS — N200 Calculus of kidney: Secondary | ICD-10-CM

## 2012-06-04 ENCOUNTER — Ambulatory Visit (HOSPITAL_COMMUNITY)
Admission: RE | Admit: 2012-06-04 | Discharge: 2012-06-04 | Disposition: A | Discharge status: Home / Self Care | Payer: Medicare Other | Source: Ambulatory Visit | Attending: Urology | Admitting: Urology

## 2012-06-04 DIAGNOSIS — N200 Calculus of kidney: Secondary | ICD-10-CM | POA: Insufficient documentation

## 2012-06-08 ENCOUNTER — Other Ambulatory Visit: Payer: Self-pay | Admitting: Urology

## 2012-06-10 ENCOUNTER — Encounter (HOSPITAL_COMMUNITY): Payer: Self-pay | Admitting: Pharmacy Technician

## 2012-06-10 ENCOUNTER — Encounter (HOSPITAL_COMMUNITY): Payer: Self-pay | Admitting: *Deleted

## 2012-06-10 NOTE — Progress Notes (Signed)
06/10/12 Pharmacy entered medications.  Nurse rechecked meds.  Med list from Nursing Home with chart.  Nurse spoke with father- Marik Sedore- father to be here at 0745 am on 06/17/12 and is aware surgery at 1015am.  Father states patient does not communicate at all.  Made father aware that [facility] would be receiving a copy of preop instructions as well as a phone call.  Father has phone numbers for Short Stay as well as PST in case needs to contact us.  Father verified [facility] would be transportation for patient.  Nurse contacted [facility] and spoke with Harriett Sine, nurse of patient fax of preop instructions had been sent and she will call me if any questions regarding instructions.  Nurse verified that [facility] would provide transportation for patient.  Medical history entered into EPIC from information received from [facility].

## 2012-06-16 ENCOUNTER — Emergency Department (HOSPITAL_COMMUNITY): Payer: Medicare Other

## 2012-06-16 ENCOUNTER — Encounter (HOSPITAL_COMMUNITY): Payer: Self-pay | Admitting: Anesthesiology

## 2012-06-16 ENCOUNTER — Emergency Department (HOSPITAL_COMMUNITY): Payer: Medicare Other | Admitting: Anesthesiology

## 2012-06-16 ENCOUNTER — Encounter (HOSPITAL_COMMUNITY)
Admission: EM | Disposition: A | Discharge status: Skilled Nursing Facility | Payer: Self-pay | Source: Home / Self Care | Attending: Urology

## 2012-06-16 ENCOUNTER — Inpatient Hospital Stay (HOSPITAL_COMMUNITY)
Admission: EM | Admit: 2012-06-16 | Discharge: 2012-06-19 | DRG: 693 | Disposition: A | Discharge status: Skilled Nursing Facility | Payer: Medicare Other | Attending: Urology | Admitting: Urology

## 2012-06-16 DIAGNOSIS — N200 Calculus of kidney: Secondary | ICD-10-CM

## 2012-06-16 DIAGNOSIS — N179 Acute kidney failure, unspecified: Secondary | ICD-10-CM | POA: Diagnosis present

## 2012-06-16 DIAGNOSIS — F79 Unspecified intellectual disabilities: Secondary | ICD-10-CM | POA: Diagnosis present

## 2012-06-16 DIAGNOSIS — N201 Calculus of ureter: Principal | ICD-10-CM | POA: Diagnosis present

## 2012-06-16 DIAGNOSIS — A419 Sepsis, unspecified organism: Secondary | ICD-10-CM | POA: Diagnosis present

## 2012-06-16 DIAGNOSIS — G40909 Epilepsy, unspecified, not intractable, without status epilepticus: Secondary | ICD-10-CM | POA: Diagnosis present

## 2012-06-16 DIAGNOSIS — N390 Urinary tract infection, site not specified: Secondary | ICD-10-CM

## 2012-06-16 DIAGNOSIS — R509 Fever, unspecified: Secondary | ICD-10-CM

## 2012-06-16 LAB — COMPREHENSIVE METABOLIC PANEL
AST: 16 U/L (ref 0–37)
BUN: 12 mg/dL (ref 6–23)
CO2: 21 mEq/L (ref 19–32)
Calcium: 8.7 mg/dL (ref 8.4–10.5)
Creatinine, Ser: 0.43 mg/dL — ABNORMAL LOW (ref 0.50–1.35)
GFR calc Af Amer: >90 mL/min (ref >90)
GFR calc non Af Amer: >90 mL/min (ref >90)
Glucose, Bld: 107 mg/dL — ABNORMAL HIGH (ref 70–99)

## 2012-06-16 LAB — CBC WITH DIFFERENTIAL/PLATELET
Basophils Absolute: 0 10*3/uL (ref 0.0–0.1)
HCT: 41.4 % (ref 39.0–52.0)
Lymphs Abs: 0.6 10*3/uL — ABNORMAL LOW (ref 0.7–4.0)
MCV: 94.1 fL (ref 78.0–100.0)
Monocytes Relative: 10 % (ref 3–12)
Neutro Abs: 6.4 10*3/uL (ref 1.7–7.7)
RDW: 16.5 % — ABNORMAL HIGH (ref 11.5–15.5)
WBC: 8 10*3/uL (ref 4.0–10.5)

## 2012-06-16 LAB — URINALYSIS, ROUTINE W REFLEX MICROSCOPIC
Glucose, UA: NEGATIVE mg/dL
Ketones, ur: 40 mg/dL — AB
Protein, ur: 30 mg/dL — AB

## 2012-06-16 LAB — LACTIC ACID, PLASMA: Lactic Acid, Venous: 0.7 mmol/L (ref 0.5–2.2)

## 2012-06-16 LAB — URINE MICROSCOPIC-ADD ON

## 2012-06-16 SURGERY — CYSTOSCOPY, WITH STENT INSERTION
Anesthesia: General | Site: Ureter | Laterality: Right | Wound class: Contaminated

## 2012-06-16 MED ORDER — FENTANYL CITRATE 0.05 MG/ML IJ SOLN
INTRAMUSCULAR | Status: DC | PRN
  Administered 2012-06-16: 100 ug via INTRAVENOUS

## 2012-06-16 MED ORDER — CEFTRIAXONE SODIUM 1 G IJ SOLR
1.0000 g | Freq: Once | INTRAMUSCULAR | Status: AC
  Administered 2012-06-16: 1 g via INTRAVENOUS
  Filled 2012-06-16: qty 10

## 2012-06-16 MED ORDER — IOHEXOL 300 MG/ML  SOLN
INTRAMUSCULAR | Status: AC
  Filled 2012-06-16: qty 1

## 2012-06-16 MED ORDER — MIDAZOLAM HCL 5 MG/5ML IJ SOLN
INTRAMUSCULAR | Status: DC | PRN
  Administered 2012-06-16: 2 mg via INTRAVENOUS

## 2012-06-16 MED ORDER — SODIUM CHLORIDE 0.9 % IV BOLUS (SEPSIS)
1000.0000 mL | Freq: Once | INTRAVENOUS | Status: AC
  Administered 2012-06-16: 1000 mL via INTRAVENOUS

## 2012-06-16 MED ORDER — LACTATED RINGERS IV SOLN
INTRAVENOUS | Status: DC | PRN
  Administered 2012-06-16: via INTRAVENOUS

## 2012-06-16 SURGICAL SUPPLY — 13 items
ADAPTER CATH URET PLST 4-6FR (CATHETERS) IMPLANT
BAG URO CATCHER STRL LF (DRAPE) ×2 IMPLANT
CATH INTERMIT  6FR 70CM (CATHETERS) ×2 IMPLANT
CLOTH BEACON ORANGE TIMEOUT ST (SAFETY) ×2 IMPLANT
DRAPE CAMERA CLOSED 9X96 (DRAPES) ×2 IMPLANT
GLOVE BIOGEL M STRL SZ7.5 (GLOVE) ×4 IMPLANT
GOWN STRL NON-REIN LRG LVL3 (GOWN DISPOSABLE) ×4 IMPLANT
GUIDEWIRE STR DUAL SENSOR (WIRE) ×2 IMPLANT
MANIFOLD NEPTUNE II (INSTRUMENTS) ×2 IMPLANT
PACK CYSTO (CUSTOM PROCEDURE TRAY) ×2 IMPLANT
STENT CONTOUR 6FRX24X.038 (STENTS) ×2 IMPLANT
TUBING CONNECTING 10 (TUBING) ×2 IMPLANT
WATER STERILE IRR 1000ML UROMA (IV SOLUTION) ×2 IMPLANT

## 2012-06-16 NOTE — Anesthesia Preprocedure Evaluation (Signed)
Anesthesia Evaluation  Patient identified by MRN, date of birth, ID band Patient awake    Reviewed: Allergy & Precautions, H&P , NPO status , Patient's Chart, lab work & pertinent test results  Airway Mallampati: III TM Distance: >3 FB Neck ROM: full    Dental No notable dental hx. (+) Teeth Intact and Dental Advisory Given   Pulmonary pneumonia -, resolved,  breath sounds clear to auscultation  Pulmonary exam normal       Cardiovascular Exercise Tolerance: Good negative cardio ROS  Rhythm:regular Rate:Normal     Neuro/Psych Seizures -, Well Controlled,  Mental retardation. Epilepsy negative psych ROS   GI/Hepatic negative GI ROS, Neg liver ROS,   Endo/Other  negative endocrine ROS  Renal/GU negative Renal ROS  negative genitourinary   Musculoskeletal   Abdominal   Peds  Hematology negative hematology ROS (+)   Anesthesia Other Findings   Reproductive/Obstetrics negative OB ROS                           Anesthesia Physical  Anesthesia Plan  ASA: III  Anesthesia Plan: General   Post-op Pain Management:    Induction: Intravenous  Airway Management Planned: LMA  Additional Equipment:   Intra-op Plan:   Post-operative Plan:   Informed Consent: I have reviewed the patients History and Physical, chart, labs and discussed the procedure including the risks, benefits and alternatives for the proposed anesthesia with the patient or authorized representative who has indicated his/her understanding and acceptance.   Dental Advisory Given  Plan Discussed with: CRNA and Surgeon  Anesthesia Plan Comments:         Anesthesia Quick Evaluation  

## 2012-06-16 NOTE — ED Notes (Signed)
Urologist informed RN that pt will be going to sx tonight.

## 2012-06-16 NOTE — Op Note (Signed)
Preoperative diagnosis:  1. Right ureteral stone 2. Fever   Postoperative diagnosis:  1. Right ureteral stone 2. Fever   Procedure:  1. Cystoscopy 2. Right ureteral stent placement (6 x 24)   Surgeon: Rolly Salter, Montez Hageman. M.D.  Anesthesia: General  Complications: None  EBL: Minimal  Specimens: None  Indication: Charles Humphrey is a 56 y.o. patient with a fever and a right ureteral stone. After reviewing the management options for treatment, it was recommended to proceed with the above surgical procedure(s). We have discussed the potential benefits and risks of the procedure with his father (medical power of attorney), side effects of the proposed treatment, the likelihood of the patient achieving the goals of the procedure, and any potential problems that might occur during the procedure or recuperation. Verbal informed consent has been obtained from his father.  Description of procedure:  The patient was taken to the operating room and general anesthesia was induced.  The patient was placed in the dorsal lithotomy position, prepped and draped in the usual sterile fashion, and preoperative antibiotics were administered. A preoperative time-out was performed.   Cystourethroscopy was performed.  The patient's urethra was examined and was normal. The bladder was then systematically examined in its entirety. There was no evidence for any bladder tumors, stones, or other mucosal pathology.    A 0.38 sensor guidewire was then advanced up the right ureter into the renal pelvis under fluoroscopic guidance.  The wire was then backloaded through the cystoscope and a ureteral stent was advance over the wire using Seldinger technique.  The stent was positioned appropriately under fluoroscopic and cystoscopic guidance.  The wire was then removed with an adequate stent curl noted in the renal pelvis as well as in the bladder.  The bladder was then emptied and the procedure ended.  The  patient appeared to tolerate the procedure well and without complications.  The patient was able to be awakened and transferred to the recovery unit in satisfactory condition.    Moody Bruins MD

## 2012-06-16 NOTE — ED Notes (Signed)
01/11/13

## 2012-06-16 NOTE — H&P (Signed)
Seen today as an ER follow-up.   Active Problems Problems  1. Hydronephrosis On The Right 591 2. Ureteral Stone 592.1  History of Present Illness     55 YO male patient of Dr. Brynnley Dayrit's seen today for an ER follow-up. Was seen in the ER 05/21/12 for right flank pain. CTU: 7 mm right upper proximal ureteral calculus w/mild hydronephrosis.   S/P: (L) PCNL 3/13.   Interval HX:  Today father states that his son does not appear to be in any further pain but he is unsure if stone has passed. SNF has not informed him if they have seen stone. No hematuria or fever/chills that he is aware of at this time.   Past Medical History Problems  1. 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 History of  Epilepsy 345.90 3. History of  Hyperkalemia 276.7 4. History of  Lymphedema 457.1 5. 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 History of  Metabolic Encephalopathy 348.31 7. History of  Nephrolithiasis V13.01 8. History of  Obesity 278.00 9. History of  Pneumonia V12.61 10. History of  Septicemia 038.9 11. History of  Ulcerative Colitis 556.9  Surgical History Problems  1. History of  Percutaneous Lithotomy  Current Meds 1. Albuterol AERS; Therapy: (Recorded:25Mar2013) to 2. GuaiFENesin TABS; Therapy: (Recorded:25Mar2013) to 3. Lactulose 10 GM/15ML Oral Solution; Therapy: (Recorded:25Mar2013) to 4. Mucinex TB12; Therapy: (Recorded:25Mar2013) to 5. Norco 5-325 MG Oral Tablet; Therapy: (Recorded:25Mar2013) to 6. PHENobarbital 100 MG Oral Tablet; Therapy: (Recorded:25Mar2013) to 7. Phenytoin 125 MG/5ML Oral Suspension; Therapy: (Recorded:25Mar2013) to 8. Potassium Chloride Crys ER 10 MEQ Oral Tablet Extended Release; Therapy:  (Recorded:25Mar2013) to 9. Tylenol TABS; Therapy: (Recorded:25Mar2013) to 10. Vancomycin HCl Powder; Therapy: (Recorded:25Mar2013) to  Allergies Medication  1. No Known Drug Allergies  Social History Denied  1. History of  Alcohol Use 2. History of  Tobacco Use 305.1  Review  of Systems Genitourinary, constitutional, skin, eye, otolaryngeal, hematologic/lymphatic, cardiovascular, pulmonary, endocrine, musculoskeletal, gastrointestinal, neurological and psychiatric system(s) were reviewed and pertinent findings if present are noted.    Vitals Vital Signs [Data Includes: Last 1 Day]  09Jul2013 02:39PM  Weight: 167 lb  Blood Pressure: 93 / 66 Temperature: 97.9 F Heart Rate: 106  Physical Exam Constitutional: Well nourished and well developed . No acute distress. The patient appears well hydrated.  Abdomen: The abdomen is obese. The abdomen is soft and nontender. No suprapubic tenderness.  Skin: Normal skin turgor and normal skin color and pigmentation.  Neuro/Psych:. Mood and affect are appropriate.    Results/Data  Old records or history reviewed: Reviewed ER notes.  The following images/tracing/specimen were independently visualized:  KUB: unable to visualize either kidney or right ureteral calculus due to excessive amount of gas/stool throughout abdomen.    Assessment Assessed  1. Ureteral Stone 592.1 2. Hydronephrosis On The Right 591 3. Nephrolithiasis Of The Right Kidney 592.0  Plan Hydronephrosis On The Right (591), Ureteral Stone (592.1)  1. Follow-up MD/NP/PA Office  Follow-up  Requested for: 09Jul2013 2. CT-OUTSIDE  Requested for: 09Jul2013 Nephrolithiasis Of The Right Kidney (592.0)  3. KUB  Done: 09Jul2013 12:00AM Ureteral Stone (592.1)  4. KUB 5. OUTSIDE RAD MISC   RTC in 1 week for pre-clinic CTU @ [facility] than OV w/Dr. Joscelin Fray to discuss if stone still present and treatment plan Instructed SNF to give Mag Citrate 1/2 bottle now-if no BM in 4 hrs repeat than begin Miralax 17 gms po daily Tamsulosin 0.4 mg 1 po daily Strain urine. If stone captured send w/pt   at f/u appt   

## 2012-06-16 NOTE — ED Notes (Signed)
Urologist at bedside.

## 2012-06-16 NOTE — ED Notes (Signed)
Janelle Floor, RN [phone]

## 2012-06-16 NOTE — H&P (View-Only) (Signed)
Seen today as an ER follow-up.   Active Problems Problems  1. Hydronephrosis On The Right 591 2. Ureteral Stone 592.1  History of Present Illness     56 YO male patient of Dr. Sabre Romberger's seen today for an ER follow-up. Was seen in the ER 05/21/12 for right flank pain. CTU: 7 mm right upper proximal ureteral calculus w/mild hydronephrosis.   S/P: (L) PCNL 3/13.   Interval HX:  Today father states that his son does not appear to be in any further pain but he is unsure if stone has passed. SNF has not informed him if they have seen stone. No hematuria or fever/chills that he is aware of at this time.   Past Medical History Problems  1. 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 History of  Epilepsy 345.90 3. History of  Hyperkalemia 276.7 4. History of  Lymphedema 457.1 5. 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 History of  Metabolic Encephalopathy 348.31 7. History of  Nephrolithiasis V13.01 8. History of  Obesity 278.00 9. History of  Pneumonia V12.61 10. History of  Septicemia 038.9 11. History of  Ulcerative Colitis 556.9  Surgical History Problems  1. History of  Percutaneous Lithotomy  Current Meds 1. Albuterol AERS; Therapy: (Recorded:25Mar2013) to 2. GuaiFENesin TABS; Therapy: (Recorded:25Mar2013) to 3. Lactulose 10 GM/15ML Oral Solution; Therapy: (Recorded:25Mar2013) to 4. Mucinex TB12; Therapy: (Recorded:25Mar2013) to 5. Norco 5-325 MG Oral Tablet; Therapy: (Recorded:25Mar2013) to 6. PHENobarbital 100 MG Oral Tablet; Therapy: (Recorded:25Mar2013) to 7. Phenytoin 125 MG/5ML Oral Suspension; Therapy: (Recorded:25Mar2013) to 8. Potassium Chloride Crys ER 10 MEQ Oral Tablet Extended Release; Therapy:  (Recorded:25Mar2013) to 9. Tylenol TABS; Therapy: (Recorded:25Mar2013) to 10. Vancomycin HCl Powder; Therapy: (Recorded:25Mar2013) to  Allergies Medication  1. No Known Drug Allergies  Social History Denied  1. History of  Alcohol Use 2. History of  Tobacco Use 305.1  Review  of Systems Genitourinary, constitutional, skin, eye, otolaryngeal, hematologic/lymphatic, cardiovascular, pulmonary, endocrine, musculoskeletal, gastrointestinal, neurological and psychiatric system(s) were reviewed and pertinent findings if present are noted.    Vitals Vital Signs [Data Includes: Last 1 Day]  09Jul2013 02:39PM  Weight: 167 lb  Blood Pressure: 93 / 66 Temperature: 97.9 F Heart Rate: 106  Physical Exam Constitutional: Well nourished and well developed . No acute distress. The patient appears well hydrated.  Abdomen: The abdomen is obese. The abdomen is soft and nontender. No suprapubic tenderness.  Skin: Normal skin turgor and normal skin color and pigmentation.  Neuro/Psych:. Mood and affect are appropriate.    Results/Data  Old records or history reviewed: Reviewed ER notes.  The following images/tracing/specimen were independently visualized:  KUB: unable to visualize either kidney or right ureteral calculus due to excessive amount of gas/stool throughout abdomen.    Assessment Assessed  1. Ureteral Stone 592.1 2. Hydronephrosis On The Right 591 3. Nephrolithiasis Of The Right Kidney 592.0  Plan Hydronephrosis On The Right (591), Ureteral Stone (592.1)  1. Follow-up MD/NP/PA Office  Follow-up  Requested for: 09Jul2013 2. CT-OUTSIDE  Requested for: 09Jul2013 Nephrolithiasis Of The Right Kidney (592.0)  3. KUB  Done: 09Jul2013 12:00AM Ureteral Stone (592.1)  4. KUB 5. OUTSIDE RAD MISC   RTC in 1 week for pre-clinic CTU @ [facility] than OV w/Dr. Jovon Winterhalter to discuss if stone still present and treatment plan Instructed SNF to give Mag Citrate 1/2 bottle now-if no BM in 4 hrs repeat than begin Miralax 17 gms po daily Tamsulosin 0.4 mg 1 po daily Strain urine. If stone captured send w/pt   at f/u appt   

## 2012-06-16 NOTE — ED Notes (Signed)
Per EMS- Patient is a resident of [facility] nursing facility. Patient is scheduled for kidney stone removal tomorrow. Staff noted a temp 102.0 and gave him a Tylenol supp 650 mg. Temp decreased to 100.9. Rhonchi bilaterally. Staff reports that the patient is normally non-verbal and pale. Upon arrival Sats were 80%. Patient placed on 100% NRB. Sats increased to 100% and upon arrival to the ED he was changed to O2 2L/min via [place] and sats were 96%.

## 2012-06-16 NOTE — Interval H&P Note (Signed)
History and Physical Interval Note:  Charles Humphrey developed fever today and has a known left ureteral stone.  He presented to ED tonight. He has been tachycardic and likely developing sepsis.  He will undergo cystoscopy and right ureteral stent placement.  I have obtained verbal consent from his father, Charles Humphrey, tonight who gives inform consent.  He will be admitted for IV antibiotics.  06/16/2012 11:07 PM  Charles Humphrey  has presented today for surgery, with the diagnosis of right ureteral obstruction  The various methods of treatment have been discussed with the patient's father. After consideration of risks, benefits and other options for treatment, the patient has consented to  Procedure(s) (LRB): CYSTOSCOPY WITH STENT PLACEMENT (Right) as a surgical intervention .  The patient's history has been reviewed, patient examined, no change in status, stable for surgery.  I have reviewed the patient's chart and labs.  Questions were answered to the patient's satisfaction.     Consuela Widener,LES

## 2012-06-16 NOTE — ED Provider Notes (Signed)
History     CSN: [id number]  Arrival date & time 06/16/12  1821   First MD Initiated Contact with Patient 06/16/12 1849      Chief Complaint  Patient presents with  . Fever  . Shortness of Breath    (Consider location/radiation/quality/duration/timing/severity/associated sxs/prior treatment) HPI Comments: Per family, pt scheduled for lithotripsy tomorrow. Pt not eating or drinking for last 3 days. Diagnosed with a kidney stone few days ago. Pt also was noted to have cough, and had a fever of 102 today at his nursing home facility. Pt at baseline, non verbal, unable to provide history.   The history is provided by a relative and the nursing home.    Past Medical History  Diagnosis Date  . Ulcerative colitis, unspecified   . Metabolic encephalopathy   . Unspecified intellectual disabilities   . Obesity, unspecified   . Hyperpotassemia   . Other convulsions   . Other lymphedema   . Unspecified constipation   . Other physical therapy   . Muscle weakness (generalized)   . Nephrolithiasis 01/12/2012  . Hydronephrosis 01/12/2012  . Healthcare-associated pneumonia 01/12/2012  . Intellectual disability     Past Surgical History  Procedure Date  . Nephrolithotomy 02/05/2012    Procedure: NEPHROLITHOTOMY PERCUTANEOUS;  Surgeon: Anner Crete, MD;  Location: [facility];  Service: Urology;  Laterality: Left;  left kidney  . Fracture right arm    . Fracture left femur      No family history on file.  History  Substance Use Topics  . Smoking status: Never Smoker   . Smokeless tobacco: Never Used  . Alcohol Use: No      Review of Systems  Unable to perform ROS: Other    Allergies  Review of patient's allergies indicates no known allergies.  Home Medications   Current Outpatient Rx  Name Route Sig Dispense Refill  . ALBUTEROL SULFATE (2.5 MG/3ML) 0.083% IN NEBU Nebulization Take 2.5 mg by nebulization every 6 (six) hours as needed. For shortness of breath    . DOCUSATE  SODIUM 250 MG PO CAPS Oral Take 250 mg by mouth every 12 (twelve) hours.    Marland Kitchen LACTULOSE 10 GM/15ML PO SOLN Oral Take 30 g by mouth every morning.     Marland Kitchen MAGNESIUM CITRATE 1.745 GM/30ML PO SOLN Oral Take 1 Bottle by mouth once. Half bottle now if no BM repeat. Discontinue when BM    . PHENOBARBITAL 100 MG PO TABS Oral Take 100 mg by mouth every morning. Takes along with a 60mg  tablet for a total of 160mg     . PHENOBARBITAL 60 MG PO TABS Oral Take 60 mg by mouth every morning. Takes along with a 100mg  tablet for a total dosing of 160mg     . PHENYTOIN 125 MG/5ML PO SUSP Oral Take 175 mg by mouth every 12 (twelve) hours.     Marland Kitchen PHENYTOIN 125 MG/5ML PO SUSP Oral Take 175 mg by mouth 2 (two) times daily. Every 12 hours    . POLYETHYLENE GLYCOL 3350 PO PACK Oral Take 17 g by mouth at bedtime. For 2 weeks starting 7/10 and ending 7/23    . POTASSIUM CHLORIDE ER 10 MEQ PO TBCR Oral Take 20 mEq by mouth every morning.     Marland Kitchen TAMSULOSIN HCL 0.4 MG PO CAPS Oral Take 0.4 mg by mouth daily after breakfast.      BP 134/98  Pulse 125  Temp 100.8 F (38.2 C) (Rectal)  Resp 20  SpO2 96%  Physical Exam  Nursing note and vitals reviewed. Constitutional: He appears well-developed and well-nourished. No distress.  HENT:  Head: Normocephalic.  Eyes: Conjunctivae are normal.  Neck: Neck supple.  Cardiovascular: Regular rhythm and normal heart sounds.        tachycardic  Pulmonary/Chest: Effort normal and breath sounds normal. No respiratory distress. He has no wheezes. He has no rales.  Abdominal: Soft. Bowel sounds are normal. He exhibits distension. There is no tenderness. There is no rebound.  Musculoskeletal: Normal range of motion. He exhibits no edema.  Neurological: He is alert.       Disoriented, non verbal  Skin: Skin is warm and dry. There is pallor.  Psychiatric: He has a normal mood and affect.    ED Course  Procedures (including critical care time)  Pt is febrile, lab, cxr oredered.  Cultures ordered. Pt received tylenol PR.   Results for orders placed during the hospital encounter of 06/16/12  URINALYSIS, ROUTINE W REFLEX MICROSCOPIC      Component Value Range   Color, Urine YELLOW  YELLOW   APPearance CLOUDY (*) CLEAR   Specific Gravity, Urine 1.025  1.005 - 1.030   pH 6.0  5.0 - 8.0   Glucose, UA NEGATIVE  NEGATIVE mg/dL   Hgb urine dipstick MODERATE (*) NEGATIVE   Bilirubin Urine NEGATIVE  NEGATIVE   Ketones, ur 40 (*) NEGATIVE mg/dL   Protein, ur 30 (*) NEGATIVE mg/dL   Urobilinogen, UA 0.2  0.0 - 1.0 mg/dL   Nitrite NEGATIVE  NEGATIVE   Leukocytes, UA TRACE (*) NEGATIVE  CBC WITH DIFFERENTIAL      Component Value Range   WBC 8.0  4.0 - 10.5 K/uL   RBC 4.40  4.22 - 5.81 MIL/uL   Hemoglobin 15.0  13.0 - 17.0 g/dL   HCT 16.1  09.6 - 04.5 %   MCV 94.1  78.0 - 100.0 fL   MCH 34.1 (*) 26.0 - 34.0 pg   MCHC 36.2 (*) 30.0 - 36.0 g/dL   RDW 40.9 (*) 81.1 - 91.4 %   Platelets    150 - 400 K/uL   Value: PLATELET CLUMPS NOTED ON SMEAR, COUNT APPEARS DECREASED   Neutrophils Relative 79 (*) 43 - 77 %   Lymphocytes Relative 8 (*) 12 - 46 %   Monocytes Relative 10  3 - 12 %   Eosinophils Relative 3  0 - 5 %   Basophils Relative 0  0 - 1 %   Neutro Abs 6.4  1.7 - 7.7 K/uL   Lymphs Abs 0.6 (*) 0.7 - 4.0 K/uL   Monocytes Absolute 0.8  0.1 - 1.0 K/uL   Eosinophils Absolute 0.2  0.0 - 0.7 K/uL   Basophils Absolute 0.0  0.0 - 0.1 K/uL  COMPREHENSIVE METABOLIC PANEL      Component Value Range   Sodium 138  135 - 145 mEq/L   Potassium 3.8  3.5 - 5.1 mEq/L   Chloride 103  96 - 112 mEq/L   CO2 21  19 - 32 mEq/L   Glucose, Bld 107 (*) 70 - 99 mg/dL   BUN 12  6 - 23 mg/dL   Creatinine, Ser 7.82 (*) 0.50 - 1.35 mg/dL   Calcium 8.7  8.4 - 95.6 mg/dL   Total Protein 7.4  6.0 - 8.3 g/dL   Albumin 3.0 (*) 3.5 - 5.2 g/dL   AST 16  0 - 37 U/L   ALT 12  0 - 53 U/L  Alkaline Phosphatase 176 (*) 39 - 117 U/L   Total Bilirubin 0.3  0.3 - 1.2 mg/dL   GFR calc non Af Amer  >90  >90 mL/min   GFR calc Af Amer >90  >90 mL/min  PHENOBARBITAL LEVEL      Component Value Range   Phenobarbital 23.6  15.0 - 40.0 ug/mL  PHENYTOIN LEVEL, TOTAL      Component Value Range   Phenytoin Lvl 3.9 (*) 10.0 - 20.0 ug/mL  URINE CULTURE      Component Value Range   Specimen Description URINE, CATHETERIZED     Special Requests NONE     Culture  Setup Time 06/17/2012 03:24     Colony Count NO GROWTH     Culture NO GROWTH     Report Status 06/17/2012 FINAL    URINE MICROSCOPIC-ADD ON      Component Value Range   Squamous Epithelial / LPF RARE  RARE   WBC, UA 21-50  <3 WBC/hpf   RBC / HPF 3-6  <3 RBC/hpf   Bacteria, UA FEW (*) RARE   Urine-Other RARE YEAST    LACTIC ACID, PLASMA      Component Value Range   Lactic Acid, Venous 0.7  0.5 - 2.2 mmol/L  BASIC METABOLIC PANEL      Component Value Range   Sodium 140  135 - 145 mEq/L   Potassium 3.5  3.5 - 5.1 mEq/L   Chloride 104  96 - 112 mEq/L   CO2 25  19 - 32 mEq/L   Glucose, Bld 126 (*) 70 - 99 mg/dL   BUN 9  6 - 23 mg/dL   Creatinine, Ser 4.09 (*) 0.50 - 1.35 mg/dL   Calcium 8.5  8.4 - 81.1 mg/dL   GFR calc non Af Amer >90  >90 mL/min   GFR calc Af Amer >90  >90 mL/min  CBC      Component Value Range   WBC 6.5  4.0 - 10.5 K/uL   RBC 4.54  4.22 - 5.81 MIL/uL   Hemoglobin 13.8  13.0 - 17.0 g/dL   HCT 91.4  78.2 - 95.6 %   MCV 94.9  78.0 - 100.0 fL   MCH 30.4  26.0 - 34.0 pg   MCHC 32.0  30.0 - 36.0 g/dL   RDW 21.3 (*) 08.6 - 57.8 %   Platelets 169  150 - 400 K/uL  MRSA PCR SCREENING      Component Value Range   MRSA by PCR NEGATIVE  NEGATIVE     Dg Chest 2 View  06/16/2012  *RADIOLOGY REPORT*  Clinical Data: Fever.  Short of breath.  CHEST - 2 VIEW  Comparison: 02/05/2012  Findings: Lungs are markedly under aerated with bibasilar atelectasis.  Heart is upper normal in size allowing for low volumes.  There is persistent marked distention of the transverse colon and probably associated small bowel loops as  previously seen. No obvious free intraperitoneal gas.  IMPRESSION: Bibasilar atelectasis.  Stable colonic distention.  Original Report Authenticated By: [name], M.D.     UA pending. Pt signed out with PA dammon and Dr. Jeraldine Loots, probable admission. Rocephin for infection ordered.       No diagnosis found.    MDM         Lottie Mussel, PA 06/18/12 [id number]

## 2012-06-16 NOTE — ED Notes (Signed)
Lactic Acid hemolyzed. Reported by lab.

## 2012-06-16 NOTE — ED Notes (Signed)
Parents state that pt is set to have surgery on his rt kidney for a stone tomorrow at 1015.  Facility noticed pt was warm and found that he had a fever of 102.  Pt was given a tylenol suppository and fever came down to 100.6 which is what it is upon admission to ED, rectally.  Pt is nonverbal and has his normal skin color which is very pale.  Pt has not eaten for about 4 or 5 days.  Normally has a healthy appetite.  Pt also has a cough.  02 sats have been 88-93 since being in the ER.

## 2012-06-16 NOTE — ED Notes (Signed)
Pt keeps taking pulse ox off and placing it in his mouth during his entire stay. Pt also keeps placing right hand in diaper.

## 2012-06-16 NOTE — ED Notes (Signed)
[id number]<QI> Expected date:06/16/12<BR> Expected time: 6:26 PM<BR> Means of arrival:Ambulance<BR> Comments:<BR> EMS

## 2012-06-16 NOTE — ED Provider Notes (Signed)
Charles Humphrey 8:20 PM patient discussed in sign out with Orlean Bradford PA-C. Patient with history of mental retardation and is nonverbal presents with family with reported fever of 102 at home. Capture years 100.8. Patient is tachycardic at 125. patient with history of bilateral renal kidney stones as planned lithotripsy tomorrow with Dr. Wilson Singer.  Labs and UA pending.   9:50 PM spoke with Dr. Laverle Patter with urology. He will come see patient and evaluate. Would like patient n.p.o. for possible surgery tonight.    Angus Seller, [place] 06/16/12 2201

## 2012-06-17 ENCOUNTER — Ambulatory Visit (HOSPITAL_COMMUNITY): Admission: RE | Admit: 2012-06-17 | Payer: Medicare Other | Source: Ambulatory Visit | Admitting: Urology

## 2012-06-17 ENCOUNTER — Encounter (HOSPITAL_COMMUNITY): Payer: Self-pay | Admitting: *Deleted

## 2012-06-17 ENCOUNTER — Encounter (HOSPITAL_COMMUNITY)
Admission: EM | Disposition: A | Discharge status: Skilled Nursing Facility | Payer: Self-pay | Source: Home / Self Care | Attending: Urology

## 2012-06-17 ENCOUNTER — Inpatient Hospital Stay (HOSPITAL_COMMUNITY): Payer: Medicare Other

## 2012-06-17 HISTORY — DX: Unspecified intellectual disabilities: F79

## 2012-06-17 HISTORY — PX: CYSTOSCOPY W/ URETERAL STENT PLACEMENT: SHX1429

## 2012-06-17 LAB — PHENOBARBITAL LEVEL: Phenobarbital: 23.6 ug/mL (ref 15.0–40.0)

## 2012-06-17 LAB — URINE CULTURE

## 2012-06-17 LAB — BASIC METABOLIC PANEL
Chloride: 104 mEq/L (ref 96–112)
Creatinine, Ser: 0.43 mg/dL — ABNORMAL LOW (ref 0.50–1.35)
GFR calc Af Amer: >90 mL/min (ref >90)
GFR calc non Af Amer: >90 mL/min (ref >90)
Potassium: 3.5 mEq/L (ref 3.5–5.1)

## 2012-06-17 LAB — MRSA PCR SCREENING: MRSA by PCR: NEGATIVE

## 2012-06-17 LAB — CBC
HCT: 43.1 % (ref 39.0–52.0)
RDW: 16.8 % — ABNORMAL HIGH (ref 11.5–15.5)
WBC: 6.5 10*3/uL (ref 4.0–10.5)

## 2012-06-17 SURGERY — CYSTOURETEROSCOPY, WITH RETROGRADE PYELOGRAM AND STENT INSERTION
Anesthesia: Choice | Laterality: Right

## 2012-06-17 MED ORDER — KCL IN DEXTROSE-NACL 20-5-0.45 MEQ/L-%-% IV SOLN
INTRAVENOUS | Status: DC
  Administered 2012-06-17: 03:00:00 via INTRAVENOUS
  Filled 2012-06-17 (×9): qty 1000

## 2012-06-17 MED ORDER — PHENOBARBITAL 32.4 MG PO TABS
60.0000 mg | ORAL_TABLET | Freq: Every day | ORAL | Status: DC
  Administered 2012-06-17 – 2012-06-19 (×3): 64.8 mg via ORAL
  Filled 2012-06-17 (×2): qty 2

## 2012-06-17 MED ORDER — ENOXAPARIN SODIUM 40 MG/0.4ML ~~LOC~~ SOLN
40.0000 mg | SUBCUTANEOUS | Status: DC
  Administered 2012-06-17 – 2012-06-19 (×3): 40 mg via SUBCUTANEOUS
  Filled 2012-06-17 (×3): qty 0.4

## 2012-06-17 MED ORDER — ALBUTEROL SULFATE (5 MG/ML) 0.5% IN NEBU: 2.5000 mg | INHALATION_SOLUTION | Freq: Four times a day (QID) | RESPIRATORY_TRACT | Status: DC | PRN

## 2012-06-17 MED ORDER — PROPOFOL 10 MG/ML IV EMUL
INTRAVENOUS | Status: DC | PRN
  Administered 2012-06-16: 200 mg via INTRAVENOUS

## 2012-06-17 MED ORDER — CIPROFLOXACIN IN D5W 400 MG/200ML IV SOLN: 400.0000 mg | INTRAVENOUS | Status: DC

## 2012-06-17 MED ORDER — FENTANYL CITRATE 0.05 MG/ML IJ SOLN: 25.0000 ug | INTRAMUSCULAR | Status: DC | PRN

## 2012-06-17 MED ORDER — DOCUSATE SODIUM 50 MG PO CAPS
250.0000 mg | ORAL_CAPSULE | Freq: Two times a day (BID) | ORAL | Status: DC
  Administered 2012-06-17 – 2012-06-19 (×5): 250 mg via ORAL
  Filled 2012-06-17 (×6): qty 1

## 2012-06-17 MED ORDER — LIDOCAINE HCL (CARDIAC) 20 MG/ML IV SOLN
INTRAVENOUS | Status: DC | PRN
  Administered 2012-06-16: 10 mg via INTRAVENOUS

## 2012-06-17 MED ORDER — LACTATED RINGERS IV SOLN: INTRAVENOUS | Status: DC

## 2012-06-17 MED ORDER — STERILE WATER FOR IRRIGATION IR SOLN
Status: DC | PRN
  Administered 2012-06-17: 800 mL

## 2012-06-17 MED ORDER — PHENYTOIN 125 MG/5ML PO SUSP: 175.0000 mg | Freq: Two times a day (BID) | ORAL | Status: DC

## 2012-06-17 MED ORDER — KETAMINE HCL 10 MG/ML IJ SOLN
INTRAMUSCULAR | Status: DC | PRN
  Administered 2012-06-16: 20 mg via INTRAVENOUS

## 2012-06-17 MED ORDER — TAMSULOSIN HCL 0.4 MG PO CAPS
0.4000 mg | ORAL_CAPSULE | Freq: Every day | ORAL | Status: DC
  Administered 2012-06-17 – 2012-06-19 (×3): 0.4 mg via ORAL
  Filled 2012-06-17 (×4): qty 1

## 2012-06-17 MED ORDER — DEXTROSE 5 % IV SOLN
1.0000 g | INTRAVENOUS | Status: DC
  Filled 2012-06-17: qty 10

## 2012-06-17 MED ORDER — HYDROCODONE-ACETAMINOPHEN 5-325 MG PO TABS: 1.0000 | ORAL_TABLET | ORAL | Status: DC | PRN

## 2012-06-17 MED ORDER — LACTULOSE 10 GM/15ML PO SOLN
30.0000 g | Freq: Every day | ORAL | Status: DC
  Administered 2012-06-17 – 2012-06-19 (×3): 30 g via ORAL
  Filled 2012-06-17 (×3): qty 45

## 2012-06-17 MED ORDER — PHENOBARBITAL 97.2 MG PO TABS
100.0000 mg | ORAL_TABLET | Freq: Every day | ORAL | Status: DC
  Administered 2012-06-17 – 2012-06-19 (×3): 97.2 mg via ORAL
  Filled 2012-06-17 (×2): qty 1

## 2012-06-17 MED ORDER — POTASSIUM CHLORIDE ER 10 MEQ PO TBCR
20.0000 meq | EXTENDED_RELEASE_TABLET | Freq: Every day | ORAL | Status: DC
  Administered 2012-06-17 – 2012-06-19 (×3): 20 meq via ORAL
  Filled 2012-06-17 (×3): qty 2

## 2012-06-17 MED ORDER — ONDANSETRON HCL 4 MG/2ML IJ SOLN
INTRAMUSCULAR | Status: DC | PRN
  Administered 2012-06-16 – 2012-06-17 (×2): 2 mg via INTRAVENOUS

## 2012-06-17 MED ORDER — CEFTRIAXONE SODIUM 1 G IJ SOLR
1.0000 g | INTRAMUSCULAR | Status: DC
  Administered 2012-06-17 – 2012-06-18 (×2): 1 g via INTRAMUSCULAR
  Filled 2012-06-17 (×3): qty 10

## 2012-06-17 MED ORDER — PHENYTOIN 125 MG/5ML PO SUSP
175.0000 mg | Freq: Two times a day (BID) | ORAL | Status: DC
  Administered 2012-06-17 – 2012-06-19 (×5): 175 mg via ORAL
  Filled 2012-06-17 (×7): qty 8

## 2012-06-17 NOTE — Progress Notes (Signed)
Report to barb. [email or web address]

## 2012-06-17 NOTE — Anesthesia Postprocedure Evaluation (Signed)
  Anesthesia Post-op Note  Patient: Charles Humphrey  Procedure(s) Performed: Procedure(s) (LRB): CYSTOSCOPY WITH STENT PLACEMENT (Right)  Patient Location: PACU  Anesthesia Type: General  Level of Consciousness: awake and alert   Airway and Oxygen Therapy: Patient Spontanous Breathing  Post-op Pain: mild  Post-op Assessment: Post-op Vital signs reviewed, Patient's Cardiovascular Status Stable, Respiratory Function Stable, Patent Airway and No signs of Nausea or vomiting  Post-op Vital Signs: stable  Complications: No apparent anesthesia complications

## 2012-06-17 NOTE — Transfer of Care (Signed)
Immediate Anesthesia Transfer of Care Note  Patient: Charles Humphrey  Procedure(s) Performed: Procedure(s) (LRB): CYSTOSCOPY WITH STENT PLACEMENT (Right)  Patient Location: PACU  Anesthesia Type: General  Level of Consciousness: awake  Airway & Oxygen Therapy: Patient Spontanous Breathing and Patient connected to face mask oxygen  Post-op Assessment: Report given to PACU RN and Post -op Vital signs reviewed and stable  Post vital signs: Reviewed and stable  Complications: No apparent anesthesia complications

## 2012-06-17 NOTE — Progress Notes (Signed)
Patient ID: Charles Humphrey, male   DOB: 1956/04/20, 56 y.o.   MRN: [id number] 1 Day Post-Op  Subjective: Charles Humphrey appears to be doing well since his stent placement last night.   He is afebrile and his tachycardia has improved.   He is non-communicative. ROS: He is non-communicative.  Objective: Vital signs in last 24 hours: Temp:  [97.4 F (36.3 C)-100.8 F (38.2 C)] 98.4 F (36.9 C) (07/25 0515) Pulse Rate:  [102-125] 102  (07/25 0515) Resp:  [16-22] 20  (07/25 0515) BP: (129-171)/(75-99) 144/92 mmHg (07/25 0515) SpO2:  [90 %-100 %] 90 % (07/25 0515)  Intake/Output from previous day: 07/24 0701 - 07/25 0700 In: 900 [I.V.:900] Out: 5 [Blood:5] Intake/Output this shift:    General appearance: Awake in no acute distress  Lab Results:   Charles Humphrey 06/17/12 0340 06/16/12 1955  WBC 6.5 8.0  HGB 13.8 15.0  HCT 43.1 41.4  PLT 169 PLATELET CLUMPS NOTED ON SMEAR, COUNT APPEARS DECREASED   BMET  Basename 06/17/12 0340 06/16/12 1955  NA 140 138  K 3.5 3.8  CL 104 103  CO2 25 21  GLUCOSE 126* 107*  BUN 9 12  CREATININE 0.43* 0.43*  CALCIUM 8.5 8.7   PT/INR No results found for this basename: LABPROT:2,INR:2 in the last 72 hours ABG No results found for this basename: PHART:2,PCO2:2,PO2:2,HCO3:2 in the last 72 hours  Studies/Results: Dg Chest 2 View  06/16/2012  *RADIOLOGY REPORT*  Clinical Data: Fever.  Short of breath.  CHEST - 2 VIEW  Comparison: 02/05/2012  Findings: Lungs are markedly under aerated with bibasilar atelectasis.  Heart is upper normal in size allowing for low volumes.  There is persistent marked distention of the transverse colon and probably associated small bowel loops as previously seen. No obvious free intraperitoneal gas.  IMPRESSION: Bibasilar atelectasis.  Stable colonic distention.  Original Report Authenticated By: [name], M.D.   Dg Abd 1 View  06/17/2012  *RADIOLOGY REPORT*  Clinical Data: Right ureteral calculus and fever.  ABDOMEN -  1 VIEW  Comparison: CT of the abdomen dated 06/04/2012  Findings: Single image was obtained intraoperatively with a C-arm and saved.  This shows the superior aspect of a ureteral stent which extends up into the expected position of the central collecting system.  IMPRESSION: Imaging during right ureteral stent placement.  Original Report Authenticated By: Reola Calkins, M.D.    Anti-infectives: Anti-infectives     Start     Dose/Rate Route Frequency Ordered Stop   06/17/12 2000   cefTRIAXone (ROCEPHIN) 1 g in dextrose 5 % 50 mL IVPB        1 g 100 mL/hr over 30 Minutes Intravenous Every 24 hours 06/17/12 0228     06/17/12 0228   ciprofloxacin (CIPRO) IVPB 400 mg        400 mg 200 mL/hr over 60 Minutes Intravenous 60 min pre-op 06/17/12 0228     06/16/12 2045   cefTRIAXone (ROCEPHIN) 1 g in dextrose 5 % 50 mL IVPB        1 g 100 mL/hr over 30 Minutes Intravenous  Once 06/16/12 2031 06/16/12 2123          Current Facility-Administered Medications  Medication Dose Route Frequency Provider Last Rate Last Dose  . albuterol (PROVENTIL) (5 MG/ML) 0.5% nebulizer solution 2.5 mg  2.5 mg Nebulization Q6H PRN Crecencio Mc, MD      . cefTRIAXone (ROCEPHIN) 1 g in dextrose 5 % 50 mL IVPB  1 g  Intravenous Once [employer], PA   1 g at 06/16/12 2053  . cefTRIAXone (ROCEPHIN) 1 g in dextrose 5 % 50 mL IVPB  1 g Intravenous Q24H Crecencio Mc, MD      . ciprofloxacin (CIPRO) IVPB 400 mg  400 mg Intravenous 60 min Pre-Op Anner Crete, MD      . dextrose 5 % and 0.45 % NaCl with KCl 20 mEq/L infusion   Intravenous Continuous Crecencio Mc, MD 125 mL/hr at 06/17/12 0322    . docusate sodium (COLACE) capsule 250 mg  250 mg Oral Q12H Crecencio Mc, MD      . enoxaparin (LOVENOX) injection 40 mg  40 mg Subcutaneous Q24H Crecencio Mc, MD      . HYDROcodone-acetaminophen (NORCO/VICODIN) 5-325 MG per tablet 1-2 tablet  1-2 tablet Oral Q4H PRN Crecencio Mc, MD      . lactulose (CHRONULAC) 10 GM/15ML solution 30  g  30 g Oral Daily Crecencio Mc, MD      . PHENobarbital (LUMINAL) tablet 64.8 mg  64.8 mg Oral Daily Crecencio Mc, MD      . PHENobarbital (LUMINAL) tablet 97.2 mg  97.2 mg Oral Daily Crecencio Mc, MD      . phenytoin (DILANTIN) 125 MG/5ML suspension 175 mg  175 mg Oral BID Crecencio Mc, MD      . potassium chloride (K-DUR) CR tablet 20 mEq  20 mEq Oral Daily Crecencio Mc, MD      . sodium chloride 0.9 % bolus 1,000 mL  1,000 mL Intravenous Once Tatyana A Kirichenko, PA   1,000 mL at 06/16/12 2036  . Tamsulosin HCl (FLOMAX) capsule 0.4 mg  0.4 mg Oral QPC breakfast Crecencio Mc, MD      . DISCONTD: fentaNYL (SUBLIMAZE) injection 25-50 mcg  25-50 mcg Intravenous Q5 min PRN Gaetano Hawthorne, MD      . DISCONTD: lactated ringers infusion   Intravenous Continuous Gaetano Hawthorne, MD      . DISCONTD: phenytoin (DILANTIN) 125 MG/5ML suspension 175 mg  175 mg Oral Q12H Crecencio Mc, MD      . DISCONTD: sterile water for irrigation for irrigation    PRN Crecencio Mc, MD   800 mL at 06/17/12 0005   Facility-Administered Medications Ordered in Other Encounters  Medication Dose Route Frequency Provider Last Rate Last Dose  . DISCONTD: fentaNYL (SUBLIMAZE) injection    PRN Valeda Malm, CRNA   100 mcg at 06/16/12 2345  . DISCONTD: ketamine (KETALAR) injection    PRN Valeda Malm, CRNA   20 mg at 06/16/12 2345  . DISCONTD: lactated ringers infusion    Continuous PRN Valeda Malm, CRNA      . DISCONTD: lidocaine (cardiac) 100 mg/109ml (XYLOCAINE) 20 MG/ML injection 2%    PRN Valeda Malm, CRNA   10 mg at 06/16/12 2345  . DISCONTD: midazolam (VERSED) 5 MG/5ML injection    PRN Valeda Malm, CRNA   2 mg at 06/16/12 2345  . DISCONTD: ondansetron (ZOFRAN) injection    PRN Valeda Malm, CRNA   2 mg at 06/17/12 0000  . DISCONTD: propofol (DIPRIVAN) 10 mg/ml infusion    PRN Valeda Malm, CRNA   200 mg at 06/16/12 2345    Assessment: s/p Procedure(s): CYSTOSCOPY WITH STENT PLACEMENT  His  fever has resolved.  Plan: Continue IV rocephin pending the culture results. He will need to be rescheduled for ureteroscopy for next week if possible.   LOS: 1 day  Abubakar Crispo J 06/17/2012

## 2012-06-17 NOTE — ED Provider Notes (Signed)
I was available during the later portion of this patient's course.  Gerhard Munch, MD 06/17/12 [phone]

## 2012-06-17 NOTE — Progress Notes (Signed)
INITIAL ADULT NUTRITION ASSESSMENT Date: 06/17/2012   Time: 1:59 PM Reason for Assessment: Nutrition risk   INTERVENTION: Encouraged parents to order snacks per pt preference. Recommend weigh pt as there is no weight entered for this admission. Will monitor.   ASSESSMENT: Male 56 y.o.  Dx: Cystoscopy with stent placement   Food/Nutrition Related Hx: Pt from [facility] nursing facility. Pt non-communicative. Parents present at bedside who report pt has refused to eat for the past 4-5 days which they think is r/t pt having pain from ureteral stone. Pt had cystoscopy with stent placement today. They report pt typically eats well. Noted pt's weight dropped 15 pounds from February to March of this year which parents state was unintentional. Parents state pt sometimes drank Medpass at the facility but when he doesn't want to eat, he will refuse supplements. Parents deny pt with any difficulty chewing/swallowing. Nursing reports pt eats better when parents are present, however they have been teaching staff tricks to get him to eat well when they are absent.   Hx:  Past Medical History  Diagnosis Date  . Ulcerative colitis, unspecified   . Metabolic encephalopathy   . Unspecified intellectual disabilities   . Obesity, unspecified   . Hyperpotassemia   . Other convulsions   . Other lymphedema   . Unspecified constipation   . Other physical therapy   . Muscle weakness (generalized)   . Nephrolithiasis 01/12/2012  . Hydronephrosis 01/12/2012  . Healthcare-associated pneumonia 01/12/2012  . Intellectual disability    Related Meds:  Scheduled Meds:   . cefTRIAXone (ROCEPHIN)  IV  1 g Intravenous Once  . cefTRIAXone (ROCEPHIN)  IV  1 g Intravenous Q24H  . ciprofloxacin  400 mg Intravenous 60 min Pre-Op  . docusate sodium  250 mg Oral Q12H  . enoxaparin (LOVENOX) injection  40 mg Subcutaneous Q24H  . lactulose  30 g Oral Daily  . PHENobarbital  64.8 mg Oral Daily  . PHENobarbital  97.2 mg Oral  Daily  . phenytoin  175 mg Oral BID  . potassium chloride  20 mEq Oral Daily  . sodium chloride  1,000 mL Intravenous Once  . Tamsulosin HCl  0.4 mg Oral QPC breakfast  . DISCONTD: phenytoin  175 mg Oral Q12H   Continuous Infusions:   . dextrose 5 % and 0.45 % NaCl with KCl 20 mEq/L 125 mL/hr at 06/17/12 0322  . DISCONTD: lactated ringers     PRN Meds:.albuterol, HYDROcodone-acetaminophen, DISCONTD: fentaNYL, DISCONTD: sterile water for irrigation  Ht: 5\' 5"  (165.1 cm)  Wt: 185 lb 3 oz (84 kg) from March 2013  Ideal Wt: 136 lb  % Ideal Wt: 137   Usual Wt: 200 lb in February 2013 % Usual Wt: 92  Body mass index is 30.8 kg/(m^2). Obesity Class I  Labs:  CMP     Component Value Date/Time   NA 140 06/17/2012 0340   K 3.5 06/17/2012 0340   CL 104 06/17/2012 0340   CO2 25 06/17/2012 0340   GLUCOSE 126* 06/17/2012 0340   BUN 9 06/17/2012 0340   CREATININE 0.43* 06/17/2012 0340   CALCIUM 8.5 06/17/2012 0340   PROT 7.4 06/16/2012 1955   ALBUMIN 3.0* 06/16/2012 1955   AST 16 06/16/2012 1955   ALT 12 06/16/2012 1955   ALKPHOS 176* 06/16/2012 1955   BILITOT 0.3 06/16/2012 1955   GFRNONAA >90 06/17/2012 0340   GFRAA >90 06/17/2012 0340    Intake/Output Summary (Last 24 hours) at 06/17/12 1445 Last data filed at  06/17/12 0200  Gross per 24 hour  Intake    900 ml  Output      5 ml  Net    895 ml   Last BM - PTA  Diet Order: General   IVF:    dextrose 5 % and 0.45 % NaCl with KCl 20 mEq/L Last Rate: 125 mL/hr at 06/17/12 4098  DISCONTD: lactated ringers     Estimated Nutritional Needs:   Kcal: 1800-2000 Protein: 85-100 grams Fluid: 1.8-2L  NUTRITION DIAGNOSIS: -Inadequate oral intake (NI-2.1).  Status: Ongoing -Pt meets criteria for severe PCM of acute illness AEB <50% estimated energy intake for the past 5 days with 7.5% weight loss from February to March 2013   RELATED TO: pt's refusal to eat  AS EVIDENCE BY: <25% meal intake  MONITORING/EVALUATION(Goals): Pt to  consume >90% of meals  EDUCATION NEEDS: -No education needs identified at this time  Dietitian #: [phone]  DOCUMENTATION CODES Per approved criteria  -Severe malnutrition in the context of acute illness or injury -Obesity unspecified     Charles Humphrey 06/17/2012, 1:59 PM

## 2012-06-18 ENCOUNTER — Encounter (HOSPITAL_COMMUNITY): Payer: Self-pay | Admitting: *Deleted

## 2012-06-18 MED ORDER — HYDROCODONE-ACETAMINOPHEN 5-325 MG PO TABS: 1.0000 | ORAL_TABLET | ORAL | Status: DC | PRN

## 2012-06-18 MED ORDER — CIPROFLOXACIN HCL 500 MG PO TABS: 500.0000 mg | ORAL_TABLET | Freq: Two times a day (BID) | ORAL | Status: DC

## 2012-06-18 NOTE — Discharge Summary (Signed)
Date of admission: 06/16/2012  Date of discharge: 06/18/2012  Admission diagnosis: fever with ureteral stone  Discharge diagnosis: same  Secondary diagnoses: epilepsy, mental retardation, ARF, and nephrolithiasis  History and Physical: For full details, please see admission history and physical. Briefly, Charles Humphrey is a 56 y.o. year old patient who developed fever 06/16/12 and has a known left ureteral stone. He presented to ED the night of 06/16/12. He was tachycardic and likely developing sepsis. He was in need of cystoscopy and right ureteral stent placement. Dr. Laverle Patter obtained verbal consent from his father, Charles Humphrey, who gave informed consent. He was admitted for IV antibiotics.   Hospital Course: Pt was admitted via the ED and taken to the OR 06/16/12 for urgent cystoscopy with left ureteral stent placement secondary to known left ureteral stone and development of early sepsis.  He tolerated the procedure well and was hemodynamically stable immediately post op.  He was transferred to the floor without difficulty.  He remained on IV Abx until urine culture could be reviewed.  This revealed no growth.  His fevers resolved and his vitals remained stable throughout the hospital course.  He is felt stable for transfer back to the skilled nursing facility today with scheduled f/u with Dr. Annabell Howells next week.  Laboratory values:  Basename 06/17/12 0340 06/16/12 1955  HGB 13.8 15.0  HCT 43.1 41.4    Basename 06/17/12 0340 06/16/12 1955  CREATININE 0.43* 0.43*    Disposition: Skilled nursing facility where he resides  Discharge instruction: The patient was instructed to return to his normal activity level.   Discharge medications:  Medication List  As of 06/18/2012  2:09 PM   START taking these medications         ciprofloxacin 500 MG tablet   Commonly known as: CIPRO   Take 1 tablet (500 mg total) by mouth 2 (two) times daily.      HYDROcodone-acetaminophen 5-325 MG  per tablet   Commonly known as: NORCO/VICODIN   Take 1-2 tablets by mouth every 4 (four) hours as needed.         CONTINUE taking these medications         albuterol (2.5 MG/3ML) 0.083% nebulizer solution   Commonly known as: PROVENTIL      docusate sodium 250 MG capsule   Commonly known as: COLACE      lactulose 10 GM/15ML solution   Commonly known as: CHRONULAC      magnesium citrate 1.745 GM/30ML Soln      * PHENObarbital 100 MG tablet   Commonly known as: LUMINAL      * PHENObarbital 60 MG tablet   Commonly known as: LUMINAL      * phenytoin 125 MG/5ML suspension   Commonly known as: DILANTIN      * phenytoin 125 MG/5ML suspension   Commonly known as: DILANTIN      polyethylene glycol packet   Commonly known as: MIRALAX / GLYCOLAX      potassium chloride 10 MEQ tablet   Commonly known as: K-DUR      Tamsulosin HCl 0.4 MG Caps   Commonly known as: FLOMAX     * Notice: This list has 4 medication(s) that are the same as other medications prescribed for you. Read the directions carefully, and ask your doctor or other care provider to review them with you.        Where to get your medications    These are the prescriptions that you need to  pick up.   You may get these medications from any pharmacy.         ciprofloxacin 500 MG tablet   HYDROcodone-acetaminophen 5-325 MG per tablet            Followup:  Follow-up Information    Follow up with Anner Crete, MD on 06/22/2012. (at 1:00)    Contact information:   [address] [place] [address] [phone]

## 2012-06-18 NOTE — Progress Notes (Signed)
CSW spoke with [facility] for d/c planning back to SNF.  Per facility the Pt will not be able to return to the facility today but will be able to return in the a.m.   CSW faxed FL2 and d/c summary to facility.  CSW to f/u with d/c in the a.m.   Leron Croak, LCSWA [employer] [phone]

## 2012-06-18 NOTE — Progress Notes (Signed)
Patient ID: Charles Humphrey, male   DOB: May 06, 1956, 56 y.o.   MRN: [id number]  2 Days Post-Op Subjective: Pt is non-verbal.  He does not appear in any obvious discomfort  Objective: Vital signs in last 24 hours: Temp:  [97.5 F (36.4 C)-98.3 F (36.8 C)] 98.3 F (36.8 C) (07/26 0546) Pulse Rate:  [106-114] 106  (07/26 0546) Resp:  [18] 18  (07/26 0546) BP: (120-134)/(86-91) 134/91 mmHg (07/26 0546) SpO2:  [92 %-95 %] 94 % (07/26 0546)  Intake/Output from previous day: 07/25 0701 - 07/26 0700 In: 720 [P.O.:720] Out: -  Intake/Output this shift:    Physical Exam:  General: Alert and awake Abdomen: No apparent CVAT  Lab Results:  Basename 06/17/12 0340 06/16/12 1955  HGB 13.8 15.0  HCT 43.1 41.4   BMET  Basename 06/17/12 0340 06/16/12 1955  NA 140 138  K 3.5 3.8  CL 104 103  CO2 25 21  GLUCOSE 126* 107*  BUN 9 12  CREATININE 0.43* 0.43*  CALCIUM 8.5 8.7   Urine culture:No growth  Studies/Results:  Assessment/Plan: -- Continue IV antibiotics -- Plan for outpatient antibiotics and definitive treatment of ureteral stone per Dr. Annabell Howells   LOS: 2 days   Dagon Budai,LES 06/18/2012, 7:27 AM

## 2012-06-18 NOTE — ED Provider Notes (Signed)
This was a shared visit. This patient who at baseline is minimally interactive, aphasic, now presents several days after identification of the kidney stone with new fever.  On exam, the patient is febrile, he is in no distress.  Given the concern of infected kidney stone we discussed the case with the patient's urologist.  The patient was taken to the operating room from the emergency room department.  Antibiotics were provided in the emergency department  Gerhard Munch, MD 06/18/12 1011

## 2012-06-19 NOTE — Progress Notes (Signed)
Patient ID: Charles Humphrey, male   DOB: [birth year], 56 y.o.   MRN: [id number] 3 Days Post-Op Subjective: Patient is nonverbal. Nursing staff reports no concerns. He is to be discharged back to the nursing home this morning.  Objective: Vital signs in last 24 hours: Temp:  [97.8 F (36.6 C)-99 F (37.2 C)] 97.8 F (36.6 C) (07/27 0658) Pulse Rate:  [91-117] 111  (07/27 0658) Resp:  [16-20] 16  (07/27 0658) BP: (126-156)/(89-100) 126/89 mmHg (07/27 0658) SpO2:  [92 %-100 %] 92 % (07/27 0658)  Intake/Output from previous day: 07/26 0701 - 07/27 0700 In: 750 [P.O.:750] Out: -  Intake/Output this shift:    Physical Exam:  Constitutional: Vital signs reviewed. WD WN in NAD   Eyes: PERRL, No scleral icterus.   Cardiovascular: RRR Pulmonary/Chest: Normal effort Abdominal: Soft. Non-tender, non-distended, bowel sounds are normal, no masses, organomegaly, or guarding present.  Genitourinary: Not examined Extremities: No cyanosis or edema   Lab Results:  Basename 06/17/12 0340 06/16/12 1955  HGB 13.8 15.0  HCT 43.1 41.4   BMET  Basename 06/17/12 0340 06/16/12 1955  NA 140 138  K 3.5 3.8  CL 104 103  CO2 25 21  GLUCOSE 126* 107*  BUN 9 12  CREATININE 0.43* 0.43*  CALCIUM 8.5 8.7   No results found for this basename: LABPT:3,INR:3 in the last 72 hours No results found for this basename: LABURIN:1 in the last 72 hours Results for orders placed during the hospital encounter of 06/16/12  CULTURE, BLOOD (ROUTINE X 2)     Status: Normal (Preliminary result)   Collection Time   06/16/12  7:55 PM      Component Value Range Status Comment   Specimen Description BLOOD RIGHT HAND   Final    Special Requests BOTTLES DRAWN AEROBIC ONLY 3CC   Final    Culture  Setup Time 06/17/2012 03:32   Final    Culture     Final    Value:        BLOOD CULTURE RECEIVED NO GROWTH TO DATE CULTURE WILL BE HELD FOR 5 DAYS BEFORE ISSUING A FINAL NEGATIVE REPORT   Report Status PENDING    Incomplete   CULTURE, BLOOD (ROUTINE X 2)     Status: Normal (Preliminary result)   Collection Time   06/16/12  8:00 PM      Component Value Range Status Comment   Specimen Description BLOOD LEFT HAND   Final    Special Requests BOTTLES DRAWN AEROBIC ONLY 3CC   Final    Culture  Setup Time 06/17/2012 03:32   Final    Culture     Final    Value:        BLOOD CULTURE RECEIVED NO GROWTH TO DATE CULTURE WILL BE HELD FOR 5 DAYS BEFORE ISSUING A FINAL NEGATIVE REPORT   Report Status PENDING   Incomplete   URINE CULTURE     Status: Normal   Collection Time   06/16/12  8:08 PM      Component Value Range Status Comment   Specimen Description URINE, CATHETERIZED   Final    Special Requests NONE   Final    Culture  Setup Time 06/17/2012 03:24   Final    Colony Count NO GROWTH   Final    Culture NO GROWTH   Final    Report Status 06/17/2012 FINAL   Final   MRSA PCR SCREENING     Status: Normal   Collection Time  06/17/12 10:30 AM      Component Value Range Status Comment   MRSA by PCR NEGATIVE  NEGATIVE Final     Studies/Results: No results found.  Assessment/Plan:   No obvious clinical problems identified. Patient is to go back to his nursing facility today. Definitive stone surgery schedule for early next week.   LOS: 3 days   Boysie Bonebrake S 06/19/2012, 10:17 AM

## 2012-06-19 NOTE — Progress Notes (Signed)
Pt to be d/c today to [facility].  Pt and family agreeable. Confirmed plans with facility.  Plan transfer via EMS.   Leron Croak, LCSWA [employer] [phone]

## 2012-06-21 ENCOUNTER — Other Ambulatory Visit: Payer: Self-pay | Admitting: Urology

## 2012-06-21 ENCOUNTER — Encounter (HOSPITAL_COMMUNITY): Payer: Self-pay | Admitting: Pharmacy Technician

## 2012-06-21 NOTE — Progress Notes (Signed)
Requested orders for surgery to Lossie Faes on 06/21/12 at 1010am.

## 2012-06-21 NOTE — Progress Notes (Signed)
06/21/12 Surgery posted for 06/22/12  Had only stent done on 06/16/12 per Dr Laverle Patter per Elita Quick at [facility].  Lossie Faes stated Lawson Fiscal- nurse at [facility] she had spoken with her this am- aware of surgery on 06/22/12 and arrival time for pt at 1100.  I called and spoke with Lawson Fiscal at [facility] She is to send updated medication list.  I told her I would fax to her copy of instruction sheet and she is to let me know when she receives.  I left a message for father of pt - Brittin Janik at [phone] regarding date and time of surgery on 06/22/12.  I asked father to call back at [phone] to know he received message.

## 2012-06-21 NOTE — Progress Notes (Signed)
06/21/12 Spoke with Lawson Fiscal , nurse at  [facility] who is nurse of pt.  Nurse verified that Phenobarbital is taken at 2100pm. by patient.   I explained to her there is a discrepancy on the Medication Administration Record from [facility].

## 2012-06-21 NOTE — Progress Notes (Signed)
06/21/12 Naomi at [facility], nurse of patient verified same day instructions that were faxed had been received by them.

## 2012-06-21 NOTE — Progress Notes (Signed)
06/21/12 Spoke with father.  Father is aware of date and time of surgery on 06/22/12.  Father states he will be here at [facility] at 1100amm.

## 2012-06-22 ENCOUNTER — Ambulatory Visit (HOSPITAL_COMMUNITY): Payer: Medicare Other | Admitting: Anesthesiology

## 2012-06-22 ENCOUNTER — Encounter (HOSPITAL_COMMUNITY): Payer: Self-pay

## 2012-06-22 ENCOUNTER — Encounter (HOSPITAL_COMMUNITY): Payer: Self-pay | Admitting: Anesthesiology

## 2012-06-22 ENCOUNTER — Ambulatory Visit (HOSPITAL_COMMUNITY)
Admission: RE | Admit: 2012-06-22 | Discharge: 2012-06-22 | Disposition: A | Discharge status: Skilled Nursing Facility | Payer: Medicare Other | Source: Ambulatory Visit | Attending: Urology | Admitting: Urology

## 2012-06-22 ENCOUNTER — Encounter (HOSPITAL_COMMUNITY): Payer: Self-pay | Admitting: *Deleted

## 2012-06-22 ENCOUNTER — Inpatient Hospital Stay (HOSPITAL_COMMUNITY)
Admission: EM | Admit: 2012-06-22 | Discharge: 2012-06-30 | DRG: 871 | Disposition: A | Discharge status: Skilled Nursing Facility | Payer: Medicare Other | Attending: Family Medicine | Admitting: Family Medicine

## 2012-06-22 ENCOUNTER — Encounter (HOSPITAL_COMMUNITY)
Admission: RE | Disposition: A | Discharge status: Skilled Nursing Facility | Payer: Self-pay | Source: Ambulatory Visit | Attending: Urology

## 2012-06-22 DIAGNOSIS — A4159 Other Gram-negative sepsis: Principal | ICD-10-CM | POA: Diagnosis present

## 2012-06-22 DIAGNOSIS — B49 Unspecified mycosis: Secondary | ICD-10-CM | POA: Diagnosis present

## 2012-06-22 DIAGNOSIS — N200 Calculus of kidney: Secondary | ICD-10-CM | POA: Diagnosis present

## 2012-06-22 DIAGNOSIS — D72829 Elevated white blood cell count, unspecified: Secondary | ICD-10-CM | POA: Diagnosis present

## 2012-06-22 DIAGNOSIS — A0472 Enterocolitis due to Clostridium difficile, not specified as recurrent: Secondary | ICD-10-CM

## 2012-06-22 DIAGNOSIS — J690 Pneumonitis due to inhalation of food and vomit: Secondary | ICD-10-CM | POA: Diagnosis present

## 2012-06-22 DIAGNOSIS — F72 Severe intellectual disabilities: Secondary | ICD-10-CM | POA: Diagnosis present

## 2012-06-22 DIAGNOSIS — N1330 Unspecified hydronephrosis: Secondary | ICD-10-CM | POA: Diagnosis present

## 2012-06-22 DIAGNOSIS — A419 Sepsis, unspecified organism: Secondary | ICD-10-CM | POA: Diagnosis present

## 2012-06-22 DIAGNOSIS — Z7401 Bed confinement status: Secondary | ICD-10-CM

## 2012-06-22 DIAGNOSIS — D509 Iron deficiency anemia, unspecified: Secondary | ICD-10-CM | POA: Diagnosis present

## 2012-06-22 DIAGNOSIS — R739 Hyperglycemia, unspecified: Secondary | ICD-10-CM

## 2012-06-22 DIAGNOSIS — N179 Acute kidney failure, unspecified: Secondary | ICD-10-CM

## 2012-06-22 DIAGNOSIS — G40909 Epilepsy, unspecified, not intractable, without status epilepticus: Secondary | ICD-10-CM | POA: Diagnosis present

## 2012-06-22 DIAGNOSIS — J189 Pneumonia, unspecified organism: Secondary | ICD-10-CM | POA: Diagnosis present

## 2012-06-22 DIAGNOSIS — E870 Hyperosmolality and hypernatremia: Secondary | ICD-10-CM

## 2012-06-22 DIAGNOSIS — F79 Unspecified intellectual disabilities: Secondary | ICD-10-CM | POA: Diagnosis present

## 2012-06-22 DIAGNOSIS — N390 Urinary tract infection, site not specified: Secondary | ICD-10-CM | POA: Diagnosis present

## 2012-06-22 DIAGNOSIS — D649 Anemia, unspecified: Secondary | ICD-10-CM

## 2012-06-22 DIAGNOSIS — N201 Calculus of ureter: Secondary | ICD-10-CM | POA: Insufficient documentation

## 2012-06-22 DIAGNOSIS — E876 Hypokalemia: Secondary | ICD-10-CM | POA: Diagnosis present

## 2012-06-22 DIAGNOSIS — Z66 Do not resuscitate: Secondary | ICD-10-CM | POA: Diagnosis present

## 2012-06-22 DIAGNOSIS — Z79899 Other long term (current) drug therapy: Secondary | ICD-10-CM

## 2012-06-22 HISTORY — DX: Iron deficiency anemia, unspecified: D50.9

## 2012-06-22 HISTORY — PX: CYSTOSCOPY W/ URETERAL STENT PLACEMENT: SHX1429

## 2012-06-22 HISTORY — PX: CYSTOSCOPY/RETROGRADE/URETEROSCOPY: SHX5316

## 2012-06-22 HISTORY — DX: Unspecified mycosis: B49

## 2012-06-22 SURGERY — CYSTOSCOPY/RETROGRADE/URETEROSCOPY
Anesthesia: General | Site: Ureter | Laterality: Right | Wound class: Clean Contaminated

## 2012-06-22 MED ORDER — FENTANYL CITRATE 0.05 MG/ML IJ SOLN: 25.0000 ug | INTRAMUSCULAR | Status: DC | PRN

## 2012-06-22 MED ORDER — FENTANYL CITRATE 0.05 MG/ML IJ SOLN
INTRAMUSCULAR | Status: DC | PRN
  Administered 2012-06-22 (×3): 50 ug via INTRAVENOUS

## 2012-06-22 MED ORDER — CIPROFLOXACIN IN D5W 400 MG/200ML IV SOLN
INTRAVENOUS | Status: AC
  Filled 2012-06-22: qty 200

## 2012-06-22 MED ORDER — MUPIROCIN 2 % EX OINT
TOPICAL_OINTMENT | Freq: Two times a day (BID) | CUTANEOUS | Status: DC
  Filled 2012-06-22: qty 22

## 2012-06-22 MED ORDER — LACTATED RINGERS IV SOLN: INTRAVENOUS | Status: DC

## 2012-06-22 MED ORDER — SODIUM CHLORIDE 0.9 % IR SOLN
Status: DC | PRN
  Administered 2012-06-22: 3000 mL

## 2012-06-22 MED ORDER — MEPERIDINE HCL 50 MG/ML IJ SOLN: 6.2500 mg | INTRAMUSCULAR | Status: DC | PRN

## 2012-06-22 MED ORDER — IOHEXOL 300 MG/ML  SOLN
INTRAMUSCULAR | Status: AC
  Filled 2012-06-22: qty 1

## 2012-06-22 MED ORDER — CIPROFLOXACIN IN D5W 400 MG/200ML IV SOLN
400.0000 mg | INTRAVENOUS | Status: AC
  Administered 2012-06-22: 400 mg via INTRAVENOUS

## 2012-06-22 MED ORDER — LACTATED RINGERS IV SOLN
INTRAVENOUS | Status: DC | PRN
  Administered 2012-06-22 (×2): via INTRAVENOUS

## 2012-06-22 MED ORDER — PROMETHAZINE HCL 25 MG/ML IJ SOLN: 6.2500 mg | INTRAMUSCULAR | Status: DC | PRN

## 2012-06-22 MED ORDER — PROPOFOL 10 MG/ML IV BOLUS
INTRAVENOUS | Status: DC | PRN
  Administered 2012-06-22: 200 mg via INTRAVENOUS

## 2012-06-22 MED ORDER — ONDANSETRON HCL 4 MG/2ML IJ SOLN
INTRAMUSCULAR | Status: DC | PRN
  Administered 2012-06-22: 4 mg via INTRAVENOUS

## 2012-06-22 MED ORDER — LIDOCAINE HCL (CARDIAC) 20 MG/ML IV SOLN
INTRAVENOUS | Status: DC | PRN
  Administered 2012-06-22: 100 mg via INTRAVENOUS

## 2012-06-22 SURGICAL SUPPLY — 24 items
BAG URO CATCHER STRL LF (DRAPE) ×2 IMPLANT
BASKET LASER NITINOL 1.9FR (BASKET) ×2 IMPLANT
BASKET ZERO TIP NITINOL 2.4FR (BASKET) ×2 IMPLANT
CATH FOLEY 2WAY SLVR  5CC 18FR (CATHETERS) ×1
CATH FOLEY 2WAY SLVR 5CC 18FR (CATHETERS) ×1 IMPLANT
CATH URET 5FR 28IN OPEN ENDED (CATHETERS) ×2 IMPLANT
CLOTH BEACON ORANGE TIMEOUT ST (SAFETY) ×2 IMPLANT
DRAPE CAMERA CLOSED 9X96 (DRAPES) ×2 IMPLANT
GLOVE SURG SS PI 8.0 STRL IVOR (GLOVE) ×2 IMPLANT
GOWN PREVENTION PLUS XLARGE (GOWN DISPOSABLE) ×2 IMPLANT
GOWN STRL REIN XL XLG (GOWN DISPOSABLE) ×2 IMPLANT
GUIDEWIRE ANG ZIPWIRE 038X150 (WIRE) IMPLANT
GUIDEWIRE STR DUAL SENSOR (WIRE) ×2 IMPLANT
LASER FIBER DISP (UROLOGICAL SUPPLIES) ×2 IMPLANT
MANIFOLD NEPTUNE II (INSTRUMENTS) ×2 IMPLANT
MARKER SKIN DUAL TIP RULER LAB (MISCELLANEOUS) ×2 IMPLANT
PACK CYSTO (CUSTOM PROCEDURE TRAY) ×2 IMPLANT
SHEATH ACCESS URETERAL 38CM (SHEATH) ×2 IMPLANT
STENT CONTOUR 6FRX24X.038 (STENTS) ×2 IMPLANT
SUT PLAIN 3 0 X 1 18 (SUTURE) IMPLANT
SYR 30ML LL (SYRINGE) ×2 IMPLANT
SYR CONTROL 10ML LL (SYRINGE) ×2 IMPLANT
TUBING CONNECTING 10 (TUBING) ×2 IMPLANT
WIRE COONS/BENSON .038X145CM (WIRE) ×2 IMPLANT

## 2012-06-22 NOTE — Anesthesia Preprocedure Evaluation (Signed)
Anesthesia Evaluation  Patient identified by MRN, date of birth, ID band Patient awake    Reviewed: Allergy & Precautions, H&P , NPO status , Patient's Chart, lab work & pertinent test results  Airway Mallampati: III TM Distance: >3 FB Neck ROM: full    Dental No notable dental hx. (+) Teeth Intact and Dental Advisory Given   Pulmonary pneumonia -, resolved,  breath sounds clear to auscultation  Pulmonary exam normal       Cardiovascular Exercise Tolerance: Good negative cardio ROS  Rhythm:regular Rate:Normal     Neuro/Psych Seizures -, Well Controlled,  Mental retardation. Epilepsy negative psych ROS   GI/Hepatic negative GI ROS, Neg liver ROS,   Endo/Other  negative endocrine ROS  Renal/GU negative Renal ROS  negative genitourinary   Musculoskeletal   Abdominal   Peds  Hematology negative hematology ROS (+)   Anesthesia Other Findings   Reproductive/Obstetrics negative OB ROS                           Anesthesia Physical  Anesthesia Plan  ASA: III  Anesthesia Plan: General   Post-op Pain Management:    Induction: Intravenous  Airway Management Planned: LMA  Additional Equipment:   Intra-op Plan:   Post-operative Plan:   Informed Consent: I have reviewed the patients History and Physical, chart, labs and discussed the procedure including the risks, benefits and alternatives for the proposed anesthesia with the patient or authorized representative who has indicated his/her understanding and acceptance.   Dental Advisory Given  Plan Discussed with: CRNA and Surgeon  Anesthesia Plan Comments:         Anesthesia Quick Evaluation

## 2012-06-22 NOTE — Progress Notes (Signed)
Psychosocial and sucide questions not asked. Patien unable to speak. He lives at [facility]. Father is here with him

## 2012-06-22 NOTE — Anesthesia Postprocedure Evaluation (Signed)
  Anesthesia Post-op Note  Patient: Charles Humphrey  Procedure(s) Performed: Procedure(s) (LRB): CYSTOSCOPY/RETROGRADE/URETEROSCOPY (Right) CYSTOSCOPY WITH STENT REPLACEMENT (Right) HOLMIUM LASER APPLICATION (Right)  Patient Location: PACU  Anesthesia Type: General  Level of Consciousness: awake and alert   Airway and Oxygen Therapy: Patient Spontanous Breathing  Post-op Pain: mild  Post-op Assessment: Post-op Vital signs reviewed, Patient's Cardiovascular Status Stable, Respiratory Function Stable, Patent Airway and No signs of Nausea or vomiting  Post-op Vital Signs: stable  Complications: No apparent anesthesia complications

## 2012-06-22 NOTE — Preoperative (Signed)
Beta Blockers   Reason not to administer Beta Blockers:Not Applicable 

## 2012-06-22 NOTE — ED Notes (Addendum)
Per EMS: Pt from [facility]. Pt had stent placed today. Bright red blood in foley. Since procedure pt has temperature 102.2 F with moisture, tachycardic (130), ST on monitor. Sats 96 on O2 [place] at 3L. Hx of sepsis and seizure. DNR order in place.

## 2012-06-22 NOTE — Transfer of Care (Signed)
Immediate Anesthesia Transfer of Care Note  Patient: Charles Humphrey  Procedure(s) Performed: Procedure(s) (LRB): CYSTOSCOPY/RETROGRADE/URETEROSCOPY (Right) CYSTOSCOPY WITH STENT REPLACEMENT (Right) HOLMIUM LASER APPLICATION (Right)  Patient Location: PACU  Anesthesia Type: General  Level of Consciousness: Alert, cooperative  Airway & Oxygen Therapy: Patient Spontanous Breathing and Patient connected to face mask oxygen  Post-op Assessment: Report given to PACU RN, Post -op Vital signs reviewed and stable and Patient moving all extremities  Post vital signs: Reviewed and stable  Complications: No apparent anesthesia complications

## 2012-06-22 NOTE — ED Notes (Signed)
ZOX:[id number]<AV> Expected date:06/22/12<BR> Expected time:10:49 PM<BR> Means of arrival:Ambulance<BR> Comments:<BR> Fever,urinary sxs

## 2012-06-22 NOTE — Brief Op Note (Signed)
06/22/2012  2:52 PM  PATIENT:  Charles Humphrey  56 y.o. male  PRE-OPERATIVE DIAGNOSIS:  right renal stone  POST-OPERATIVE DIAGNOSIS:  Right renal stone  PROCEDURE:  Procedure(s) (LRB): CYSTOSCOPY/RETROGRADE/URETEROSCOPY with stone extraction. (Right) CYSTOSCOPY WITH STENT REPLACEMENT (Right) HOLMIUM LASER APPLICATION (Right)  SURGEON:  Surgeon(s) and Role:    * Anner Crete, MD - Primary  PHYSICIAN ASSISTANT:   ASSISTANTS: none   ANESTHESIA:   general  EBL:  Total I/O In: 1000 [I.V.:1000] Out: -   BLOOD ADMINISTERED:none  DRAINS: Urinary Catheter (Foley) and right 6x24 JJ stent.   LOCAL MEDICATIONS USED:  NONE  SPECIMEN:  Source of Specimen:  stone fragments  DISPOSITION OF SPECIMEN:  to family  COUNTS:  YES  TOURNIQUET:  * No tourniquets in log *  DICTATION: .Other Dictation: Dictation Number [id number]  PLAN OF CARE: Discharge to home after PACU  PATIENT DISPOSITION:  PACU - hemodynamically stable.   Delay start of Pharmacological VTE agent (>24hrs) due to surgical blood loss or risk of bleeding: no

## 2012-06-23 ENCOUNTER — Encounter (HOSPITAL_COMMUNITY): Payer: Self-pay

## 2012-06-23 ENCOUNTER — Emergency Department (HOSPITAL_COMMUNITY): Payer: Medicare Other

## 2012-06-23 DIAGNOSIS — N390 Urinary tract infection, site not specified: Secondary | ICD-10-CM | POA: Diagnosis present

## 2012-06-23 DIAGNOSIS — F79 Unspecified intellectual disabilities: Secondary | ICD-10-CM

## 2012-06-23 LAB — BLOOD GAS, ARTERIAL
Acid-Base Excess: 2.4 mmol/L — ABNORMAL HIGH (ref 0.0–2.0)
Drawn by: 11249
O2 Content: 2 L/min
pCO2 arterial: 44.3 mmHg (ref 35.0–45.0)
pH, Arterial: 7.403 (ref 7.350–7.450)
pO2, Arterial: 84.2 mmHg (ref 80.0–100.0)

## 2012-06-23 LAB — HEPATIC FUNCTION PANEL
ALT: 11 U/L (ref 0–53)
AST: 19 U/L (ref 0–37)
Albumin: 2.8 g/dL — ABNORMAL LOW (ref 3.5–5.2)
Alkaline Phosphatase: 133 U/L — ABNORMAL HIGH (ref 39–117)
Total Bilirubin: 0.2 mg/dL — ABNORMAL LOW (ref 0.3–1.2)

## 2012-06-23 LAB — BASIC METABOLIC PANEL
CO2: 27 mEq/L (ref 19–32)
Chloride: 108 mEq/L (ref 96–112)
Creatinine, Ser: 0.58 mg/dL (ref 0.50–1.35)

## 2012-06-23 LAB — CULTURE, BLOOD (ROUTINE X 2)

## 2012-06-23 LAB — CBC
HCT: 42.5 % (ref 39.0–52.0)
HCT: 45.5 % (ref 39.0–52.0)
Hemoglobin: 14 g/dL (ref 13.0–17.0)
Hemoglobin: 14.6 g/dL (ref 13.0–17.0)
MCH: 31.7 pg (ref 26.0–34.0)
MCHC: 32.1 g/dL (ref 30.0–36.0)
MCV: 96.2 fL (ref 78.0–100.0)
Platelets: 244 10*3/uL (ref 150–400)
RBC: 4.42 MIL/uL (ref 4.22–5.81)
WBC: 14.6 10*3/uL — ABNORMAL HIGH (ref 4.0–10.5)

## 2012-06-23 LAB — URINALYSIS, ROUTINE W REFLEX MICROSCOPIC
Bilirubin Urine: NEGATIVE
Ketones, ur: NEGATIVE mg/dL
Protein, ur: 100 mg/dL — AB
Urobilinogen, UA: 0.2 mg/dL (ref 0.0–1.0)

## 2012-06-23 LAB — URINE MICROSCOPIC-ADD ON

## 2012-06-23 LAB — TROPONIN I: Troponin I: <0.3 ng/mL (ref <0.30)

## 2012-06-23 LAB — MAGNESIUM: Magnesium: 1.8 mg/dL (ref 1.5–2.5)

## 2012-06-23 MED ORDER — POTASSIUM CHLORIDE IN NACL 20-0.9 MEQ/L-% IV SOLN: INTRAVENOUS | Status: DC

## 2012-06-23 MED ORDER — SODIUM CHLORIDE 0.9 % IV BOLUS (SEPSIS)
2000.0000 mL | Freq: Once | INTRAVENOUS | Status: AC
  Administered 2012-06-23: 2000 mL via INTRAVENOUS

## 2012-06-23 MED ORDER — ONDANSETRON HCL 4 MG/2ML IJ SOLN: 4.0000 mg | Freq: Four times a day (QID) | INTRAMUSCULAR | Status: DC | PRN

## 2012-06-23 MED ORDER — PHENOBARBITAL 32.4 MG PO TABS: 97.2000 mg | ORAL_TABLET | Freq: Every day | ORAL | Status: DC

## 2012-06-23 MED ORDER — POTASSIUM CHLORIDE IN NACL 40-0.9 MEQ/L-% IV SOLN
INTRAVENOUS | Status: DC
  Administered 2012-06-23 (×2): via INTRAVENOUS
  Administered 2012-06-24: 125 mL/h via INTRAVENOUS
  Filled 2012-06-23 (×4): qty 1000

## 2012-06-23 MED ORDER — DEXTROSE 5 % IV SOLN
1.0000 g | Freq: Three times a day (TID) | INTRAVENOUS | Status: DC
  Administered 2012-06-23 – 2012-06-25 (×7): 1 g via INTRAVENOUS
  Filled 2012-06-23 (×8): qty 1

## 2012-06-23 MED ORDER — ENOXAPARIN SODIUM 40 MG/0.4ML ~~LOC~~ SOLN
40.0000 mg | SUBCUTANEOUS | Status: DC
  Administered 2012-06-23 – 2012-06-30 (×8): 40 mg via SUBCUTANEOUS
  Filled 2012-06-23 (×8): qty 0.4

## 2012-06-23 MED ORDER — VANCOMYCIN HCL IN DEXTROSE 1-5 GM/200ML-% IV SOLN
1000.0000 mg | Freq: Once | INTRAVENOUS | Status: DC
  Filled 2012-06-23: qty 200

## 2012-06-23 MED ORDER — PHENYTOIN SODIUM 50 MG/ML IJ SOLN
100.0000 mg | Freq: Three times a day (TID) | INTRAMUSCULAR | Status: DC
  Administered 2012-06-23 – 2012-06-27 (×12): 100 mg via INTRAVENOUS
  Filled 2012-06-23 (×17): qty 2

## 2012-06-23 MED ORDER — PHENOBARBITAL 32.4 MG PO TABS: 64.8000 mg | ORAL_TABLET | Freq: Every day | ORAL | Status: DC

## 2012-06-23 MED ORDER — ACETAMINOPHEN 650 MG RE SUPP
650.0000 mg | Freq: Once | RECTAL | Status: AC
  Administered 2012-06-23: 650 mg via RECTAL
  Filled 2012-06-23: qty 1

## 2012-06-23 MED ORDER — PHENYTOIN 125 MG/5ML PO SUSP
175.0000 mg | Freq: Two times a day (BID) | ORAL | Status: DC
  Filled 2012-06-23 (×2): qty 8

## 2012-06-23 MED ORDER — SODIUM CHLORIDE 0.9 % IV BOLUS (SEPSIS)
1000.0000 mL | Freq: Once | INTRAVENOUS | Status: AC
  Administered 2012-06-23: 1000 mL via INTRAVENOUS

## 2012-06-23 MED ORDER — PIPERACILLIN-TAZOBACTAM 3.375 G IVPB
3.3750 g | Freq: Once | INTRAVENOUS | Status: AC
  Administered 2012-06-23: 3.375 g via INTRAVENOUS
  Filled 2012-06-23: qty 50

## 2012-06-23 MED ORDER — PHENOBARBITAL 100 MG PO TABS: 100.0000 mg | ORAL_TABLET | Freq: Every day | ORAL | Status: DC

## 2012-06-23 MED ORDER — POTASSIUM CHLORIDE 10 MEQ/100ML IV SOLN
INTRAVENOUS | Status: AC
  Administered 2012-06-23: 10 meq via INTRAVENOUS
  Filled 2012-06-23: qty 100

## 2012-06-23 MED ORDER — POTASSIUM CHLORIDE 10 MEQ/100ML IV SOLN
10.0000 meq | INTRAVENOUS | Status: AC
  Administered 2012-06-23 (×3): 10 meq via INTRAVENOUS
  Filled 2012-06-23 (×2): qty 100

## 2012-06-23 MED ORDER — PHENOBARBITAL SODIUM 130 MG/ML IJ SOLN
130.0000 mg | Freq: Every day | INTRAMUSCULAR | Status: DC
  Administered 2012-06-23 – 2012-06-26 (×4): 130 mg via INTRAVENOUS
  Filled 2012-06-23 (×4): qty 1

## 2012-06-23 MED ORDER — PHENOBARBITAL 60 MG PO TABS: 60.0000 mg | ORAL_TABLET | Freq: Every day | ORAL | Status: DC

## 2012-06-23 MED ORDER — SODIUM CHLORIDE 0.9 % IJ SOLN
10.0000 mL | Freq: Two times a day (BID) | INTRAMUSCULAR | Status: DC
  Administered 2012-06-23 – 2012-06-29 (×7): 10 mL

## 2012-06-23 MED ORDER — VANCOMYCIN HCL IN DEXTROSE 1-5 GM/200ML-% IV SOLN
1000.0000 mg | Freq: Three times a day (TID) | INTRAVENOUS | Status: DC
  Administered 2012-06-23 – 2012-06-25 (×4): 1000 mg via INTRAVENOUS
  Filled 2012-06-23 (×8): qty 200

## 2012-06-23 MED ORDER — ALBUTEROL SULFATE (5 MG/ML) 0.5% IN NEBU
2.5000 mg | INHALATION_SOLUTION | Freq: Four times a day (QID) | RESPIRATORY_TRACT | Status: DC
  Administered 2012-06-23 – 2012-06-28 (×20): 2.5 mg via RESPIRATORY_TRACT
  Filled 2012-06-23 (×20): qty 0.5

## 2012-06-23 MED ORDER — POTASSIUM CHLORIDE CRYS ER 20 MEQ PO TBCR: 40.0000 meq | EXTENDED_RELEASE_TABLET | ORAL | Status: DC

## 2012-06-23 MED ORDER — ACETAMINOPHEN 650 MG RE SUPP
650.0000 mg | RECTAL | Status: DC | PRN
  Administered 2012-06-23 (×2): 650 mg via RECTAL
  Filled 2012-06-23 (×2): qty 1

## 2012-06-23 MED ORDER — SODIUM CHLORIDE 0.9 % IJ SOLN: 10.0000 mL | INTRAMUSCULAR | Status: DC | PRN

## 2012-06-23 MED ORDER — LEVOFLOXACIN IN D5W 750 MG/150ML IV SOLN
750.0000 mg | INTRAVENOUS | Status: DC
  Administered 2012-06-23 – 2012-06-24 (×2): 750 mg via INTRAVENOUS
  Filled 2012-06-23 (×4): qty 150

## 2012-06-23 NOTE — ED Notes (Signed)
IV nurse called back and stated she would be there in a couple minutes.

## 2012-06-23 NOTE — Progress Notes (Signed)
Pharmacy: Dilantin   56 yo M with history of seizures on phenobarbitol 160 mg QHS and dilantin suspension 175 mg PO BID PTA at nursing home.  Pharmacy asked to help with dosing of IV dilantin as patient unable to take PO at this time.     Phenytoin level today = 13.4/Corrected phenytoin level  = 20.3 (Goal = 10-20)  Albumin 2.8   CrCl > 100 ml/min  Phenytoin level today was roughly a trough level and was at the high end of normal range.  Will give a slightly lower IV dose and repeat level in 2-3 days.    Plan:  1.) D/C liquid phenytoin until patient is able to swallow 2.) Start phenytoin 100 mg IV q8h, first dose at 1600  3.) Monitor levels and labs and adjust dose as needed - Will f/u patients ability to swallow and change back to PO  Charles Humphrey, Loma Messing PharmD 2:18 PM 06/23/2012

## 2012-06-23 NOTE — Progress Notes (Signed)
I have seen and assessed patient and agree with Dr Kermit Balo assessment and plan. Will repeat CXR tomorrow. Continue current empiric antibiotics and follow. Check a phenytoin level. Follow. Replace potassium. UROLOGY CONSULTED AND FOLLOWING.

## 2012-06-23 NOTE — H&P (Signed)
PCP:   Kaleen Mask, MD   Chief Complaint:  Fever, tachycardic  HPI: 56 yo male with moderate to severe mental retardation, epilepsy who lives at snf and is bedbound and nonverbal at baseline over the last week or so has been battling ureteral/kidney stones and just had a stent placed rt side by urology yesterday and sent back to snf.  He has been on cipro and his urine cx have been negative.  Sent back from snf due to high fevers and high heart rate.  Pt cannot provide any history due to his chronic mental retardation, he does awaken to voice. He was found to be hypoxic on arrival and placed on oxygen.  DNR paperwork is with him from his snf.  i have talked to the nurse aide, he has not had any stooling since here in the ED.  Review of Systems:  O/w neg  Past Medical History: Past Medical History  Diagnosis Date  . Ulcerative colitis, unspecified   . Metabolic encephalopathy   . Unspecified intellectual disabilities   . Obesity, unspecified   . Hyperpotassemia   . Other convulsions   . Other lymphedema   . Unspecified constipation   . Other physical therapy   . Muscle weakness (generalized)   . Nephrolithiasis 01/12/2012  . Hydronephrosis 01/12/2012  . Healthcare-associated pneumonia 01/12/2012  . Intellectual disability    Past Surgical History  Procedure Date  . Nephrolithotomy 02/05/2012    Procedure: NEPHROLITHOTOMY PERCUTANEOUS;  Surgeon: Anner Crete, MD;  Location: [facility];  Service: Urology;  Laterality: Left;  left kidney  . Fracture right arm    . Fracture left femur    . Cystoscopy w/ ureteral stent placement 06/17/12    Medications: Prior to Admission medications   Medication Sig Start Date End Date Taking? Authorizing Provider  albuterol (PROVENTIL) (2.5 MG/3ML) 0.083% nebulizer solution Take 2.5 mg by nebulization every 6 (six) hours as needed. For shortness of breath   Yes Historical Provider, MD  ciprofloxacin (CIPRO) 500 MG tablet Take 500 mg by mouth  2 (two) times daily. For 7 days  Stop date: 06/25/12   Yes Historical Provider, MD  docusate sodium (COLACE) 250 MG capsule Take 250 mg by mouth daily.   Yes Historical Provider, MD  HYDROcodone-acetaminophen (NORCO/VICODIN) 5-325 MG per tablet Take 1 tablet by mouth every 6 (six) hours as needed. 06/19/12 06/29/12 Yes Darol Destine. Myer Haff, PA  lactulose (CHRONULAC) 10 GM/15ML solution Take 30 g by mouth 3 (three) times daily.   Yes Historical Provider, MD  PHENObarbital (LUMINAL) 100 MG tablet Take 100 mg by mouth at bedtime. Takes along with a 60mg  tablet for a total of 160mg    Yes Historical Provider, MD  PHENObarbital (LUMINAL) 60 MG tablet Take 60 mg by mouth at bedtime. Takes along with a 100mg  tablet for a total dosing of 160mg    Yes Historical Provider, MD  phenytoin (DILANTIN) 125 MG/5ML suspension Take 175 mg by mouth every 12 (twelve) hours.    Yes Historical Provider, MD  potassium chloride (K-DUR) 10 MEQ tablet Take 20 mEq by mouth every morning.    Yes Historical Provider, MD  Tamsulosin HCl (FLOMAX) 0.4 MG CAPS Take 0.4 mg by mouth daily after breakfast. 05/21/12  Yes Forbes Cellar, MD    Allergies:   Allergies  Allergen Reactions  . Other Other (See Comments)    No dairy products    Social History:  reports that he has never smoked. He has never used smokeless tobacco.  He reports that he does not drink alcohol or use illicit drugs.  Physical Exam: Filed Vitals:   06/22/12 2330 06/22/12 2336 06/23/12 0323 06/23/12 0426  BP: 97/68  111/72   Pulse: 127     Temp: 97.9 F (36.6 C) 101.4 F (38.6 C) 99.2 F (37.3 C) 102 F (38.9 C)  TempSrc: Axillary Rectal Axillary Rectal  Resp: 18  28   SpO2: 98%  93%    General appearance: no distress and slowed mentation comforatble with good color Lungs: rhonchi bilaterally Heart: regular rate and rhythm, S1, S2 normal, no murmur, click, rub or gallop Abdomen: soft, non-tender; bowel sounds normal; no masses,  no  organomegaly Extremities: extremities normal, atraumatic, no cyanosis or edema Pulses: 2+ and symmetric Skin: Skin color, texture, turgor normal. No rashes or lesions    Labs on Admission:   [facility] 06/23/12 0014  NA 144  K 3.3*  CL 108  CO2 27  GLUCOSE 149*  BUN 7  CREATININE 0.58  CALCIUM 8.5  MG --  PHOS --    Basename 06/23/12 0014  WBC 14.6*  NEUTROABS --  HGB 14.0  HCT 42.5  MCV 96.2  PLT 244    Basename 06/23/12 0014  CKTOTAL --  CKMB --  CKMBINDEX --  TROPONINI <0.30   Radiological Exams on Admission: Ct Abdomen Pelvis Wo Contrast  06/04/2012  *RADIOLOGY REPORT*  Clinical Data: History kidney stones.  Reevaluation  CT ABDOMEN AND PELVIS WITHOUT CONTRAST  Technique:  Multidetector CT imaging of the abdomen and pelvis was performed following the standard protocol without intravenous contrast.  Comparison: CT 05/21/2012  Findings:  Renal:  Non-IV contrast images demonstrate a partially obstructing calculus at the right ureteral pelvic junction measuring 9 mm not changed from prior.  There is mild hydronephrosis of the right which is similar to prior.  There is nonobstructing 5 mm calculus in the upper pole right kidney.  No distal right ureteral calculi. In the left kidney, there are three nonobstructing calculi ranging in size from 3 to  4 mm.  No obstructive uropathy on the left.  No left ureterolithiasis.  Lung bases are clear.  No pericardial fluid.  No focal hepatic lesion.  The gallbladder is collapsed.  The pancreas, spleen, adrenal gland are normal.  The stomach is gas filled similar to prior.  Small bowel and cecum are normal.  The rectum and sigmoid colon is smooth similar to prior with mild wall thickening.  Abdominal aorta normal caliber.  No retroperitoneal periportal lymphadenopathy.  No free fluid the pelvis.  The prostate gland is normal.  The bladder is mildly distended.  No evidence of bladder stones. Review of  bone windows demonstrates no aggressive  osseous lesions.  IMPRESSION:  1.  Partially obstructing calculus at the right ureteral pelvic junction is not changed from prior. 2.  Bilateral nephrolithiasis.  3.   Smooth contour to the sigmoid colon and proximal rectum could represent chronic inflammation.  There is some retention of stool in the rectum.  Original Report Authenticated By: Genevive Bi, M.D.   Dg Chest 2 View  06/23/2012  *RADIOLOGY REPORT*  Clinical Data: Tachycardia, fever, shortness of breath.  CHEST - 2 VIEW  Comparison: 06/16/2012  Findings: Hypoaeration with interstitial vascular crowding. Prominent cardiomediastinal contours, similar to prior.  Central vascular congestion.  Mild lung base opacities.  Otherwise, no focal consolidation.  No pleural effusion or pneumothorax. Osteopenia.  IMPRESSION: Hypoaeration and mild bibasilar opacities; atelectasis versus infiltrate.  Prominent cardiomediastinal contours  are similar to prior.  Original Report Authenticated By: Waneta Martins, M.D.   Dg Chest 2 View  06/16/2012  *RADIOLOGY REPORT*  Clinical Data: Fever.  Short of breath.  CHEST - 2 VIEW  Comparison: 02/05/2012  Findings: Lungs are markedly under aerated with bibasilar atelectasis.  Heart is upper normal in size allowing for low volumes.  There is persistent marked distention of the transverse colon and probably associated small bowel loops as previously seen. No obvious free intraperitoneal gas.  IMPRESSION: Bibasilar atelectasis.  Stable colonic distention.  Original Report Authenticated By: [name], M.D.   Dg Abd 1 View  06/17/2012  *RADIOLOGY REPORT*  Clinical Data: Right ureteral calculus and fever.  ABDOMEN - 1 VIEW  Comparison: CT of the abdomen dated 06/04/2012  Findings: Single image was obtained intraoperatively with a C-arm and saved.  This shows the superior aspect of a ureteral stent which extends up into the expected position of the central collecting system.  IMPRESSION: Imaging during right ureteral  stent placement.  Original Report Authenticated By: Reola Calkins, M.D.    Assessment/Plan Present on Admission:  56 yo male with likely sepsis unclear from lung or urinary tract or both .Mental retardation .Sepsis .Hydronephrosis .Nephrolithiasis .Healthcare-associated pneumonia .Epilepsy  Certainly high risk for aspiration particularly with recent sedation, will broadly cover him for hcap and urosepsis.  Ivf.  Blood urine and sputum cultures ordered.  Place in stepdown.  DNR confirmed.  Will order epilepsy meds but if not able to take them orally will need to change to iv form.  Ck lfts.  Urology has already been called to see pt later today.  Will leave repeat ct abd pelvis to them.    Elesha Thedford A [phone] 06/23/2012, 6:01 AM

## 2012-06-23 NOTE — ED Notes (Signed)
Pt has not reduce enough fresh urine for output

## 2012-06-23 NOTE — Progress Notes (Signed)
SLP Cancellation Note  Evaluation cancelled today due to medical issues with patient which prohibited swallow evaluation.  Pt not alert- did not awaken adequately for po or eval, is febrile and sealed lips closed tightly when SLP attempted oral care.  Pt's father present and reports Cori Razor stops eating when he gets sick.  Pt known to this SLP from previous admissions.   SLP phoned SNF and spoke to SNF SLP Erin who reports pt on a mechanical soft/thin diet with good tolerance.  Denny Peon states she has not seen pt for a few months but his father feeds him lunch everyday.  Father reports pt will sleep through an entire meal at times but will catch up with next meal.    SLP to return next date for bedside swallow eval as ordered.  Rec continue npo with oral care as pt will allow.     [name], MS [facility] SLP [phone]

## 2012-06-23 NOTE — Progress Notes (Signed)
Peripherally Inserted Central Catheter/Midline Placement  The IV Nurse has discussed with the patient and/or persons authorized to consent for the patient, the purpose of this procedure and the potential benefits and risks involved with this procedure.  The benefits include less needle sticks, lab draws from the catheter and patient may be discharged home with the catheter.  Risks include, but not limited to, infection, bleeding, blood clot (thrombus formation), and puncture of an artery; nerve damage and irregular heat beat.  Alternatives to this procedure were also discussed.  PICC/Midline Placement Documentation     Explained above to father,Jacarie Catherman Sr.   Franne Grip Renee 06/23/2012, 3:12 PM

## 2012-06-23 NOTE — Consult Note (Signed)
Urology Consult  Referring physician: cc: Dr. Windle Guard Cc: Dr. Bjorn Pippin Reason for referral: sepsis, fever, gross hematuria  Chief Complaint: fever  History of Present Illness: Charles Humphrey is a  56 yo wm, [facility] resident, admitted via [facility] for fever, pneumonia, and possible urosepsis, 1 day following R ureteroscopy and laser fragmentation of R renal pelvic stone, with placement of JJ stent and foley catheter. Stent and catheter ate to be left for 48 hrs, to be removed together in the outpatient setting.    The patient underwent Left percutaneous nephrolithotomy for multiple large renal stones in March, 2013 ( aggregate > 2cm). At the time he was noted to have an asymptomatic  6mm R renal pelvic stone. However, he developed pain ( father's interpretation), and repeat evaluation showed stone size increased to 7mm, with R hydronephrosis at the [facility]. He failed conservative therapy, and underwent R ureteroscopy and laser lithotripsy per Dr. Annabell Howells yesterday. JJ stent left in place, taped to a foley catheter. However , pt developed a fever, and lung congestion last night and was brought to [facility] by [facility] for care. He is admitted to [facility].      Past Medical History  Diagnosis Date  . Ulcerative colitis, unspecified   . Metabolic encephalopathy   . Unspecified intellectual disabilities   . Obesity, unspecified   . Hyperpotassemia   . Other convulsions   . Other lymphedema   . Unspecified constipation   . Other physical therapy   . Muscle weakness (generalized)   . Nephrolithiasis 01/12/2012  . Hydronephrosis 01/12/2012  . Healthcare-associated pneumonia 01/12/2012  . Intellectual disability    Past Surgical History  Procedure Date  . Nephrolithotomy 02/05/2012    Procedure: NEPHROLITHOTOMY PERCUTANEOUS;  Surgeon: Anner Crete, MD;  Location: [facility];  Service: Urology;  Laterality: Left;  left kidney  . Fracture right arm    . Fracture left femur     . Cystoscopy w/ ureteral stent placement 06/17/12    Medications: I have reviewed the patient's current medications. Allergies:  Allergies  Allergen Reactions  . Other Other (See Comments)    No dairy products    No family history on file. Social History:  reports that he has never smoked. He has never used smokeless tobacco. He reports that he does not drink alcohol or use illicit drugs.  ROS: All systems are reviewed and negative except as noted. Pt unable to communicate.   Physical Exam:  Vital signs in last 24 hours: Temp:  [96.7 F (35.9 C)-102 F (38.9 C)] 102 F (38.9 C) (07/31 0426) Pulse Rate:  [96-127] 127  (07/30 2330) Resp:  [16-28] 28  (07/31 0323) BP: (97-167)/(68-110) 111/72 mmHg (07/31 0323) SpO2:  [92 %-100 %] 93 % (07/31 0323) FiO2 (%):  [2 %] 2 % (07/31 0323) Weight:  [81.874 kg (180 lb 8 oz)-84.369 kg (186 lb)] 81.874 kg (180 lb 8 oz) (07/30 1247)  Cardiovascular: Skin warm; not flushed Respiratory: Breaths quiet; no shortness of breath Abdomen: No masses. Distended. Non-tender. No rebound.  Neurological: Normal sensation to touch Musculoskeletal: Normal motor function arms and legs Lymphatics: No inguinal adenopathy Skin: No rashes Genitourinary:Foley in place. String not seen. Bloody crust on catheter.   Laboratory Data:  Results for orders placed during the hospital encounter of 06/22/12 (from the past 72 hour(s))  CBC     Status: Abnormal   Collection Time   06/23/12 12:14 AM      Component Value  Range Comment   WBC 14.6 (*) 4.0 - 10.5 K/uL    RBC 4.42  4.22 - 5.81 MIL/uL    Hemoglobin 14.0  13.0 - 17.0 g/dL    HCT 14.7  82.9 - 56.2 %    MCV 96.2  78.0 - 100.0 fL    MCH 31.7  26.0 - 34.0 pg    MCHC 32.9  30.0 - 36.0 g/dL    RDW 13.0 (*) 86.5 - 15.5 %    Platelets 244  150 - 400 K/uL   BASIC METABOLIC PANEL     Status: Abnormal   Collection Time   06/23/12 12:14 AM      Component Value Range Comment   Sodium 144  135 - 145 mEq/L     Potassium 3.3 (*) 3.5 - 5.1 mEq/L    Chloride 108  96 - 112 mEq/L    CO2 27  19 - 32 mEq/L    Glucose, Bld 149 (*) 70 - 99 mg/dL    BUN 7  6 - 23 mg/dL    Creatinine, Ser 7.84  0.50 - 1.35 mg/dL    Calcium 8.5  8.4 - 69.6 mg/dL    GFR calc non Af Amer >90  >90 mL/min    GFR calc Af Amer >90  >90 mL/min   TROPONIN I     Status: Normal   Collection Time   06/23/12 12:14 AM      Component Value Range Comment   Troponin I <0.30  <0.30 ng/mL   PROCALCITONIN     Status: Normal   Collection Time   06/23/12 12:14 AM      Component Value Range Comment   Procalcitonin 0.64     LACTIC ACID, PLASMA     Status: Normal   Collection Time   06/23/12  2:34 AM      Component Value Range Comment   Lactic Acid, Venous 1.6  0.5 - 2.2 mmol/L   URINALYSIS, ROUTINE W REFLEX MICROSCOPIC     Status: Abnormal   Collection Time   06/23/12  4:26 AM      Component Value Range Comment   Color, Urine AMBER (*) YELLOW BIOCHEMICALS MAY BE AFFECTED BY COLOR   APPearance TURBID (*) CLEAR    Specific Gravity, Urine 1.022  1.005 - 1.030    pH 5.5  5.0 - 8.0    Glucose, UA NEGATIVE  NEGATIVE mg/dL    Hgb urine dipstick LARGE (*) NEGATIVE    Bilirubin Urine NEGATIVE  NEGATIVE    Ketones, ur NEGATIVE  NEGATIVE mg/dL    Protein, ur 295 (*) NEGATIVE mg/dL    Urobilinogen, UA 0.2  0.0 - 1.0 mg/dL    Nitrite NEGATIVE  NEGATIVE    Leukocytes, UA LARGE (*) NEGATIVE   URINE MICROSCOPIC-ADD ON     Status: Abnormal   Collection Time   06/23/12  4:26 AM      Component Value Range Comment   Squamous Epithelial / LPF FEW (*) RARE    WBC, UA TOO NUMEROUS TO COUNT  <3 WBC/hpf WITH CLUMPS   RBC / HPF TOO NUMEROUS TO COUNT  <3 RBC/hpf WITH CLUMPS   Bacteria, UA MANY (*) RARE   BLOOD GAS, ARTERIAL     Status: Abnormal   Collection Time   06/23/12  5:50 AM      Component Value Range Comment   O2 Content 2.0      Delivery systems NASAL CANNULA      pH,  Arterial 7.403  7.350 - 7.450    pCO2 arterial 44.3  35.0 - 45.0 mmHg     pO2, Arterial 84.2  80.0 - 100.0 mmHg    Bicarbonate 27.0 (*) 20.0 - 24.0 mEq/L    TCO2 24.4  0 - 100 mmol/L    Acid-Base Excess 2.4 (*) 0.0 - 2.0 mmol/L    O2 Saturation 96.7      Patient temperature 98.6      Collection site RIGHT RADIAL      Drawn by [phone]      Sample type ARTERIAL DRAW      Allens test (pass/fail) PASS  PASS    Recent Results (from the past 240 hour(s))  CULTURE, BLOOD (ROUTINE X 2)     Status: Normal (Preliminary result)   Collection Time   06/16/12  7:55 PM      Component Value Range Status Comment   Specimen Description BLOOD RIGHT HAND   Final    Special Requests BOTTLES DRAWN AEROBIC ONLY 3CC   Final    Culture  Setup Time 06/17/2012 03:32   Final    Culture     Final    Value:        BLOOD CULTURE RECEIVED NO GROWTH TO DATE CULTURE WILL BE HELD FOR 5 DAYS BEFORE ISSUING A FINAL NEGATIVE REPORT   Report Status PENDING   Incomplete   CULTURE, BLOOD (ROUTINE X 2)     Status: Normal (Preliminary result)   Collection Time   06/16/12  8:00 PM      Component Value Range Status Comment   Specimen Description BLOOD LEFT HAND   Final    Special Requests BOTTLES DRAWN AEROBIC ONLY 3CC   Final    Culture  Setup Time 06/17/2012 03:32   Final    Culture     Final    Value:        BLOOD CULTURE RECEIVED NO GROWTH TO DATE CULTURE WILL BE HELD FOR 5 DAYS BEFORE ISSUING A FINAL NEGATIVE REPORT   Report Status PENDING   Incomplete   URINE CULTURE     Status: Normal   Collection Time   06/16/12  8:08 PM      Component Value Range Status Comment   Specimen Description URINE, CATHETERIZED   Final    Special Requests NONE   Final    Culture  Setup Time 06/17/2012 03:24   Final    Colony Count NO GROWTH   Final    Culture NO GROWTH   Final    Report Status 06/17/2012 FINAL   Final   MRSA PCR SCREENING     Status: Normal   Collection Time   06/17/12 10:30 AM      Component Value Range Status Comment   MRSA by PCR NEGATIVE  NEGATIVE Final    Creatinine:  Basename  06/23/12 0014 06/17/12 0340 06/16/12 1955  CREATININE 0.58 0.43* 0.43*    Xrays: See report/chart CXR as noted. No KUB in ED.   Impression/Assessment:  Pt is 24 hrs post R renal pelvic stone laser lithotripsy. He needs KUB for stent placement. He is covered with antibiotics for presumed aspiration pneumonia and/or urosepsis. Foley cath should stay in place for now.  Plan:  KUB for stent placement. Will notify Dr. Annabell Howells.    Thelton Graca I 06/23/2012, 7:08 AM

## 2012-06-23 NOTE — Progress Notes (Signed)
CARE MANAGEMENT NOTE 06/23/2012  Patient:  Charles Humphrey, Charles Humphrey   Account Number:  [id number]  Date Initiated:  06/23/2012  Documentation initiated by:  Alena Blankenbeckler  Subjective/Objective Assessment:   patient admitted from snf with bilateral ll pna,hematuria and urosepsis     Action/Plan:   lives at [facility], severe mental retardation, only grunts for pain,   Anticipated DC Date:  06/26/2012   Anticipated DC Plan:  SKILLED NURSING FACILITY  In-house referral  Clinical Social Worker      DC Planning Services  NA      [facility] Choice  NA   Choice offered to / List presented to:  NA   DME arranged  NA      DME agency  NA     HH arranged  NA      HH agency  NA   Status of service:  In process, will continue to follow Medicare Important Message given?  NA - LOS <3 / Initial given by admissions (If response is "NO", the following Medicare IM given date fields will be blank) Date Medicare IM given:   Date Additional Medicare IM given:    Discharge Disposition:    Per UR Regulation:  Reviewed for med. necessity/level of care/duration of stay  If discussed at Long Length of Stay Meetings, dates discussed:    Comments:  [id number]/Annaelle Kasel Earlene Plater, RN, BSN, CCM: CHART REVIEWED AND UPDATED. NO DISCHARGE NEEDS PRESENT AT THIS TIME. CASE MANAGEMENT [phone]

## 2012-06-23 NOTE — ED Notes (Signed)
First attempt to call report to [place], rn on floor. rn is still in report and will call back ed rn back.

## 2012-06-23 NOTE — ED Notes (Signed)
Contact information: [facility]: [phone]; cell [phone]; [facility] [place]) [phone]

## 2012-06-23 NOTE — ED Notes (Signed)
Pt alert, will open eyes spontaneously, non verbal, but pt will grunt to try and communicate. rn repositioned pt and that appeared to comfort pt. Pt stopped grunting and went back to sleep. Respirations even and unlabored, bilateral symmetrical rise and fall of chest. Skin warm and dry. In no acute distress. Denies needs.

## 2012-06-23 NOTE — Progress Notes (Signed)
Charles Humphrey is non-verbal at baseline. Father states "when he is sick he will shut down and refuse to eat, drink, or take medicines". I attempted to give him a sip of water along with oral care. Was able to partially complete oral care as pt clench down on his mouth shaking head back and forth. Will not take meds or allow to go into mouth. Pt will open eyes at sound of name or commands, however will not follow commands (simple or complex). Pt is incontinent and will follow with eyes as movement occurs. Father very supportive.

## 2012-06-23 NOTE — ED Provider Notes (Signed)
History     CSN: [id number]  Arrival date & time 06/22/12  2317   First MD Initiated Contact with Patient 06/23/12 [phone]      Chief Complaint  Patient presents with  . Tachycardia  . Fever  . Shortness of Breath    (Consider location/radiation/quality/duration/timing/severity/associated sxs/prior treatment) The history is provided by medical records, the nursing home and the EMS personnel.    Patient is brought to ER by EMS with complaint of SNF nursing staff of fevers and bright red blood in foley catheter. EMS report to ER nursing staff states  Pt from [facility] and Pt had stent placed yesterday with bright red blood in foley. Since procedure pt has temperature 102.2 F with moisture, tachycardic (130), ST on monitor. Sats 96 on O2 [place] at 3L. Hx of sepsis and seizure. DNR order in place.  Patient had stent placement for right uretal stone by Dr. Annabell Howells yesterday. A level V caveat due to patient's mental retardation.    Past Medical History  Diagnosis Date  . Ulcerative colitis, unspecified   . Metabolic encephalopathy   . Unspecified intellectual disabilities   . Obesity, unspecified   . Hyperpotassemia   . Other convulsions   . Other lymphedema   . Unspecified constipation   . Other physical therapy   . Muscle weakness (generalized)   . Nephrolithiasis 01/12/2012  . Hydronephrosis 01/12/2012  . Healthcare-associated pneumonia 01/12/2012  . Intellectual disability     Past Surgical History  Procedure Date  . Nephrolithotomy 02/05/2012    Procedure: NEPHROLITHOTOMY PERCUTANEOUS;  Surgeon: Anner Crete, MD;  Location: [facility];  Service: Urology;  Laterality: Left;  left kidney  . Fracture right arm    . Fracture left femur    . Cystoscopy w/ ureteral stent placement 06/17/12    No family history on file.  History  Substance Use Topics  . Smoking status: Never Smoker   . Smokeless tobacco: Never Used  . Alcohol Use: No      Review of Systems  Unable  to perform ROS   Allergies  Other  Home Medications   Current Outpatient Rx  Name Route Sig Dispense Refill  . ALBUTEROL SULFATE (2.5 MG/3ML) 0.083% IN NEBU Nebulization Take 2.5 mg by nebulization every 6 (six) hours as needed. For shortness of breath    . CIPROFLOXACIN HCL 500 MG PO TABS Oral Take 500 mg by mouth 2 (two) times daily. For 7 days  Stop date: 06/25/12    . DOCUSATE SODIUM 250 MG PO CAPS Oral Take 250 mg by mouth daily.    Marland Kitchen HYDROCODONE-ACETAMINOPHEN 5-325 MG PO TABS Oral Take 1 tablet by mouth every 6 (six) hours as needed.    Marland Kitchen LACTULOSE 10 GM/15ML PO SOLN Oral Take 30 g by mouth 3 (three) times daily.    Marland Kitchen PHENOBARBITAL 100 MG PO TABS Oral Take 100 mg by mouth at bedtime. Takes along with a 60mg  tablet for a total of 160mg     . PHENOBARBITAL 60 MG PO TABS Oral Take 60 mg by mouth at bedtime. Takes along with a 100mg  tablet for a total dosing of 160mg     . PHENYTOIN 125 MG/5ML PO SUSP Oral Take 175 mg by mouth every 12 (twelve) hours.     Marland Kitchen POTASSIUM CHLORIDE ER 10 MEQ PO TBCR Oral Take 20 mEq by mouth every morning.     Marland Kitchen TAMSULOSIN HCL 0.4 MG PO CAPS Oral Take 0.4 mg by mouth daily after breakfast.  BP 111/72  Pulse 127  Temp 99.2 F (37.3 C) (Oral)  Resp 28  SpO2 93%  Physical Exam  Nursing note and vitals reviewed. Constitutional: He appears well-developed and well-nourished. No distress.  HENT:  Head: Normocephalic and atraumatic.  Eyes: Conjunctivae and EOM are normal. Pupils are equal, round, and reactive to light.  Neck: Normal range of motion. Neck supple.  Cardiovascular: Regular rhythm, normal heart sounds and intact distal pulses.  Tachycardia present.  Exam reveals no gallop and no friction rub.   No murmur heard. Pulmonary/Chest: Effort normal. No respiratory distress. He has no wheezes. He has rhonchi in the right lower field and the left lower field. He has rales in the right middle field, the right lower field, the left middle field and the  left lower field. He exhibits no tenderness.  Abdominal: Soft. Bowel sounds are normal. He exhibits no distension and no mass. There is no tenderness. There is no rebound and no guarding.  Genitourinary:       bright red blood tinged urine with some clots in foley tubing and foley bag.   Musculoskeletal: Normal range of motion. He exhibits no edema and no tenderness.  Neurological:       Will open eyes to painful stimuli  Skin: Skin is warm and dry. No rash noted. He is not diaphoretic. No erythema.  Psychiatric: He has a normal mood and affect.    ED Course  Procedures (including critical care time)  IV fluids, PR tylenol. IV vanc and zosyn   Patient evaluated by Dr. Patria Mane at bedside.   Labs Reviewed  CBC - Abnormal; Notable for the following:    WBC 14.6 (*)     RDW 16.6 (*)     All other components within normal limits  BASIC METABOLIC PANEL - Abnormal; Notable for the following:    Potassium 3.3 (*)     Glucose, Bld 149 (*)     All other components within normal limits  TROPONIN I  PROCALCITONIN  LACTIC ACID, PLASMA  CULTURE, BLOOD (ROUTINE X 2)  CULTURE, BLOOD (ROUTINE X 2)  URINE CULTURE  URINALYSIS, ROUTINE W REFLEX MICROSCOPIC   Dg Chest 2 View  06/23/2012  *RADIOLOGY REPORT*  Clinical Data: Tachycardia, fever, shortness of breath.  CHEST - 2 VIEW  Comparison: 06/16/2012  Findings: Hypoaeration with interstitial vascular crowding. Prominent cardiomediastinal contours, similar to prior.  Central vascular congestion.  Mild lung base opacities.  Otherwise, no focal consolidation.  No pleural effusion or pneumothorax. Osteopenia.  IMPRESSION: Hypoaeration and mild bibasilar opacities; atelectasis versus infiltrate.  Prominent cardiomediastinal contours are similar to prior.  Original Report Authenticated By: Waneta Martins, M.D.     1. Urosepsis   2. HCAP (healthcare-associated pneumonia)     Dr. Patria Mane consulted urology.   MDM  BP stable. Dr. Onalee Hua to admit to  step down for urosepsis with question of HCAP per xray. Will continue to monitor in ER.         [place], [place] 06/23/12 [phone]

## 2012-06-23 NOTE — Progress Notes (Signed)
Received report from Wendell, Charity fundraiser. Pt will be transported to room 1230.

## 2012-06-23 NOTE — Op Note (Signed)
NAME:  Charles Humphrey, Charles Humphrey NO.:  [id number]  MEDICAL RECORD NO.:  [id number]  LOCATION:  [facility]                         FACILITY:  [facility]  PHYSICIAN:  Excell Seltzer. Annabell Howells, M.D.    DATE OF BIRTH:  September 26, 1956  DATE OF PROCEDURE: DATE OF DISCHARGE:  06/22/2012                              OPERATIVE REPORT   PROCEDURE:  Cystoscopy.  Removal of right double-J stent.  Right ureteroscopy with holmium laser lithotripsy and basket extraction of stone fragments, insertion of right double-J stent.  PREOPERATIVE DIAGNOSIS:  Right ureteropelvic junction stone with obstruction had been pushed back into the kidney.  POSTOPERATIVE DIAGNOSIS:  Right ureteropelvic junction stone with obstruction had been pushed back into the kidney.  SURGEON:  Excell Seltzer. Annabell Howells, MD  ANESTHESIA:  General.  SPECIMEN:  Stone fragments.  DRAINS:  Six-French 24 cm double-J stent.  An 18-French Foley catheter.  COMPLICATIONS:  None.  BLOOD LOSS:  Minimal.  INDICATIONS:  Charles Humphrey is a 56 year old severely mentally handicapped white male with a history of stones who had a right UPJ stone, diagnosed a few weeks ago.  Prior to his initial appointment for ureteroscopy, developed fever to 102 and underwent placement of a stent only last week.  He remains on antibiotic and returns today for treatment of the stone findings.  PROCEDURE:  He was taken to the operating room, where general anesthetic was induced.  He was given IV.  Cipro and PAS hose were placed.  He was positioned in the lithotomy position.  His perineum and genitalia were prepped with Betadine solution and he was draped in usual sterile fashion.  Cystoscopy was performed using a 22-French scope and 12 degree lens. The right ureteral stent was grasped and pulled to the urethral meatus and a guidewire was passed to the kidney.  The cystoscopic evaluation as described in his prior op note.  Once the wire was in position,  and the stent was removed, a  38 cm digital access sheath was placed over the wire to the kidney after initial passage of the inner core to dilate the ureter.  Once the access sheath was in good position just below the UPJ. The wire and core were removed and a digital flexible ureteroscope was then inserted.  Initially, there was some clot debris noted in the kidney.  This was irrigated out and the stone was seen.  It appeared to measure approximately 8-10 mm across larger than expected and had a very spiculated appearance consistent with rapid calcium oxalate dihydrate stone growth. Once visualized the stone was engaged and a 200 micron laser fiber set on 0.5 watts and 20 Hz.  The stone was fragmented into manageable fragments. A Nitinol basket was then used to remove the fragments a large fragment at one point, would not pass through the UPJ so this was reengaged with the laser and further fragment and the fragments removed.  Final inspection after removal of some after extensive stone basketing revealed no significant residual fragments and just a few small clots.  At this point, a guidewire was reinserted through the access sheath. The access sheath was removed and a 6-French 24 cm double-J stent was placed under fluoroscopic guidance.  The wire was  removed and good coil in the bladder and good coil in the kidney.  The stent string left exiting the urethra.  An 18-French Foley catheter was inserted alongside, the stent string.  The balloon was filled with 10 mL sterile fluid, and the catheter was placed to straight drainage. Final fluoroscopy confirmed good position of the distal stent coil.  The string was secured to the patient's penis.  He was taken down from lithotomy position.  His anesthetic was reversed.  He was moved to recovery room in stable condition.  The fragments were given to the family.  He will be discharged home and has followup with me in approximately 3 weeks for an ultrasound.  His stent  will be removed at the nursing home by the attached string in approximately 2 days at the time of catheter removal.     Excell Seltzer. Annabell Howells, M.D.     JJW/MEDQ  D:  06/22/2012  T:  06/23/2012  Job:  [id number]

## 2012-06-23 NOTE — ED Provider Notes (Signed)
Medical screening examination/treatment/procedure(s) were conducted as a shared visit with non-physician practitioner(s) and myself.  I personally evaluated the patient during the encounter  May represent healthcare associated pneumonia given his productive cough and infiltrates the white count.  Vancomycin and Zosyn given.  Lactate is normal.  The patient be admitted to hospitalist service.  I discussed his case with the urologist.  Laqueta Jean will cover his urine as well.  Urine culture ordered  Dr Patsi Sears Dr Onalee Hua  1. Urosepsis   2. HCAP (healthcare-associated pneumonia)    Ct Abdomen Pelvis Wo Contrast  06/04/2012  *RADIOLOGY REPORT*  Clinical Data: History kidney stones.  Reevaluation  CT ABDOMEN AND PELVIS WITHOUT CONTRAST  Technique:  Multidetector CT imaging of the abdomen and pelvis was performed following the standard protocol without intravenous contrast.  Comparison: CT 05/21/2012  Findings:  Renal:  Non-IV contrast images demonstrate a partially obstructing calculus at the right ureteral pelvic junction measuring 9 mm not changed from prior.  There is mild hydronephrosis of the right which is similar to prior.  There is nonobstructing 5 mm calculus in the upper pole right kidney.  No distal right ureteral calculi. In the left kidney, there are three nonobstructing calculi ranging in size from 3 to  4 mm.  No obstructive uropathy on the left.  No left ureterolithiasis.  Lung bases are clear.  No pericardial fluid.  No focal hepatic lesion.  The gallbladder is collapsed.  The pancreas, spleen, adrenal gland are normal.  The stomach is gas filled similar to prior.  Small bowel and cecum are normal.  The rectum and sigmoid colon is smooth similar to prior with mild wall thickening.  Abdominal aorta normal caliber.  No retroperitoneal periportal lymphadenopathy.  No free fluid the pelvis.  The prostate gland is normal.  The bladder is mildly distended.  No evidence of bladder stones. Review of  bone  windows demonstrates no aggressive osseous lesions.  IMPRESSION:  1.  Partially obstructing calculus at the right ureteral pelvic junction is not changed from prior. 2.  Bilateral nephrolithiasis.  3.   Smooth contour to the sigmoid colon and proximal rectum could represent chronic inflammation.  There is some retention of stool in the rectum.  Original Report Authenticated By: Genevive Bi, M.D.   Dg Chest 2 View  06/23/2012  *RADIOLOGY REPORT*  Clinical Data: Tachycardia, fever, shortness of breath.  CHEST - 2 VIEW  Comparison: 06/16/2012  Findings: Hypoaeration with interstitial vascular crowding. Prominent cardiomediastinal contours, similar to prior.  Central vascular congestion.  Mild lung base opacities.  Otherwise, no focal consolidation.  No pleural effusion or pneumothorax. Osteopenia.  IMPRESSION: Hypoaeration and mild bibasilar opacities; atelectasis versus infiltrate.  Prominent cardiomediastinal contours are similar to prior.  Original Report Authenticated By: Waneta Martins, M.D.   Dg Chest 2 View  06/16/2012  *RADIOLOGY REPORT*  Clinical Data: Fever.  Short of breath.  CHEST - 2 VIEW  Comparison: 02/05/2012  Findings: Lungs are markedly under aerated with bibasilar atelectasis.  Heart is upper normal in size allowing for low volumes.  There is persistent marked distention of the transverse colon and probably associated small bowel loops as previously seen. No obvious free intraperitoneal gas.  IMPRESSION: Bibasilar atelectasis.  Stable colonic distention.  Original Report Authenticated By: [name], M.D.   Dg Abd 1 View  06/17/2012  *RADIOLOGY REPORT*  Clinical Data: Right ureteral calculus and fever.  ABDOMEN - 1 VIEW  Comparison: CT of the abdomen dated 06/04/2012  Findings:  Single image was obtained intraoperatively with a C-arm and saved.  This shows the superior aspect of a ureteral stent which extends up into the expected position of the central collecting system.   IMPRESSION: Imaging during right ureteral stent placement.  Original Report Authenticated By: Reola Calkins, M.D.  Results for orders placed during the hospital encounter of 06/22/12  CBC      Component Value Range   WBC 14.6 (*) 4.0 - 10.5 K/uL   RBC 4.42  4.22 - 5.81 MIL/uL   Hemoglobin 14.0  13.0 - 17.0 g/dL   HCT 16.1  09.6 - 04.5 %   MCV 96.2  78.0 - 100.0 fL   MCH 31.7  26.0 - 34.0 pg   MCHC 32.9  30.0 - 36.0 g/dL   RDW 40.9 (*) 81.1 - 91.4 %   Platelets 244  150 - 400 K/uL  BASIC METABOLIC PANEL      Component Value Range   Sodium 144  135 - 145 mEq/L   Potassium 3.3 (*) 3.5 - 5.1 mEq/L   Chloride 108  96 - 112 mEq/L   CO2 27  19 - 32 mEq/L   Glucose, Bld 149 (*) 70 - 99 mg/dL   BUN 7  6 - 23 mg/dL   Creatinine, Ser 7.82  0.50 - 1.35 mg/dL   Calcium 8.5  8.4 - 95.6 mg/dL   GFR calc non Af Amer >90  >90 mL/min   GFR calc Af Amer >90  >90 mL/min  TROPONIN I      Component Value Range   Troponin I <0.30  <0.30 ng/mL  URINALYSIS, ROUTINE W REFLEX MICROSCOPIC      Component Value Range   Color, Urine AMBER (*) YELLOW   APPearance TURBID (*) CLEAR   Specific Gravity, Urine 1.022  1.005 - 1.030   pH 5.5  5.0 - 8.0   Glucose, UA NEGATIVE  NEGATIVE mg/dL   Hgb urine dipstick LARGE (*) NEGATIVE   Bilirubin Urine NEGATIVE  NEGATIVE   Ketones, ur NEGATIVE  NEGATIVE mg/dL   Protein, ur 213 (*) NEGATIVE mg/dL   Urobilinogen, UA 0.2  0.0 - 1.0 mg/dL   Nitrite NEGATIVE  NEGATIVE   Leukocytes, UA LARGE (*) NEGATIVE  PROCALCITONIN      Component Value Range   Procalcitonin 0.64    LACTIC ACID, PLASMA      Component Value Range   Lactic Acid, Venous 1.6  0.5 - 2.2 mmol/L  URINE MICROSCOPIC-ADD ON      Component Value Range   Squamous Epithelial / LPF FEW (*) RARE   WBC, UA TOO NUMEROUS TO COUNT  <3 WBC/hpf   RBC / HPF TOO NUMEROUS TO COUNT  <3 RBC/hpf   Bacteria, UA MANY (*) RARE     Lyanne Co, MD 06/23/12 [phone]

## 2012-06-23 NOTE — ED Notes (Signed)
Report given nikki, rn on floor

## 2012-06-23 NOTE — ED Notes (Signed)
2 RN to attempt IV access. Unable to obtain. IV nurse paged.

## 2012-06-23 NOTE — Progress Notes (Signed)
Noted pt with elevated temperature 101.4 at 1939. Tylenol supp. Given per M.D prn order. [facility] floor coverage M.D. Contacted to address 22:00 scheduled oral meds. Pt NPO at this time. Respiratory in to give prn neb treatment. Pt repositioned. Charge Nurse Brooke aware of above.

## 2012-06-23 NOTE — Discharge Summary (Signed)
I have reviewed the note and discharge orders.

## 2012-06-23 NOTE — Progress Notes (Signed)
ANTIBIOTIC CONSULT NOTE - INITIAL  Pharmacy Consult for: Vancomycin/Levaquin/Cefepime Indication: HCAP/UTI  Allergies  Allergen Reactions  . Other Other (See Comments)    No dairy products    Patient Measurements: Height: 5' 6.14" (168 cm) Weight: 180 lb 8.9 oz (81.9 kg) IBW/kg (Calculated) : 64.13    Vital Signs: Temp: 102 F (38.9 C) (07/31 0426) Temp src: Rectal (07/31 0426) BP: 120/69 mmHg (07/31 0805) Pulse Rate: 117  (07/31 0805) Intake/Output from previous day: 07/30 0701 - 07/31 0700 In: -  Out: 175 [Urine:175] Intake/Output from this shift:    Labs:  Basename 06/23/12 0014  WBC 14.6*  HGB 14.0  PLT 244  LABCREA --  CREATININE 0.58   Estimated Creatinine Clearance: 107.5 ml/min (by C-G formula based on Cr of 0.58). No results found for this basename: VANCOTROUGH:2,VANCOPEAK:2,VANCORANDOM:2,GENTTROUGH:2,GENTPEAK:2,GENTRANDOM:2,TOBRATROUGH:2,TOBRAPEAK:2,TOBRARND:2,AMIKACINPEAK:2,AMIKACINTROU:2,AMIKACIN:2, in the last 72 hours   Microbiology: Recent Results (from the past 720 hour(s))  CULTURE, BLOOD (ROUTINE X 2)     Status: Normal   Collection Time   06/16/12  7:55 PM      Component Value Range Status Comment   Specimen Description BLOOD RIGHT HAND   Final    Special Requests BOTTLES DRAWN AEROBIC ONLY 3CC   Final    Culture  Setup Time 06/17/2012 03:32   Final    Culture NO GROWTH 5 DAYS   Final    Report Status 06/23/2012 FINAL   Final   CULTURE, BLOOD (ROUTINE X 2)     Status: Normal   Collection Time   06/16/12  8:00 PM      Component Value Range Status Comment   Specimen Description BLOOD LEFT HAND   Final    Special Requests BOTTLES DRAWN AEROBIC ONLY 3CC   Final    Culture  Setup Time 06/17/2012 03:32   Final    Culture NO GROWTH 5 DAYS   Final    Report Status 06/23/2012 FINAL   Final   URINE CULTURE     Status: Normal   Collection Time   06/16/12  8:08 PM      Component Value Range Status Comment   Specimen Description URINE, CATHETERIZED    Final    Special Requests NONE   Final    Culture  Setup Time 06/17/2012 03:24   Final    Colony Count NO GROWTH   Final    Culture NO GROWTH   Final    Report Status 06/17/2012 FINAL   Final   MRSA PCR SCREENING     Status: Normal   Collection Time   06/17/12 10:30 AM      Component Value Range Status Comment   MRSA by PCR NEGATIVE  NEGATIVE Final     Medical History: Past Medical History  Diagnosis Date  . Ulcerative colitis, unspecified   . Metabolic encephalopathy   . Unspecified intellectual disabilities   . Obesity, unspecified   . Hyperpotassemia   . Other convulsions   . Other lymphedema   . Unspecified constipation   . Other physical therapy   . Muscle weakness (generalized)   . Nephrolithiasis 01/12/2012  . Hydronephrosis 01/12/2012  . Healthcare-associated pneumonia 01/12/2012  . Intellectual disability     Medications:  Scheduled:    . acetaminophen  650 mg Rectal Once  . ceFEPime (MAXIPIME) IV  1 g Intravenous Q8H  . enoxaparin (LOVENOX) injection  40 mg Subcutaneous Q24H  . levofloxacin (LEVAQUIN) IV  750 mg Intravenous Q24H  . PHENObarbital  100 mg Oral QHS  .  PHENObarbital  60 mg Oral QHS  . phenytoin  175 mg Oral Q12H  . piperacillin-tazobactam (ZOSYN)  IV  3.375 g Intravenous Once  . potassium chloride  10 mEq Intravenous Q1 Hr x 3  . sodium chloride  2,000 mL Intravenous Once  . vancomycin  1,000 mg Intravenous Once  . vancomycin  1,000 mg Intravenous Q8H  . DISCONTD: potassium chloride  40 mEq Oral Q4H   Infusions:    . 0.9 % NaCl with KCl 40 mEq / L    . DISCONTD: 0.9 % NaCl with KCl 20 mEq / L     PRN:  Assessment:  56 yo M from SNF with sepsis secondary to suspected HCAP/UTI to start on empiric vancomycin/cefepime/levaquin.    Scr wnl with CrCl > 100 ml/min   Urine and blood cultures sent  WBC elevated 14.6  Goal of Therapy:  Vancomycin trough level 15-20 mcg/ml  Plan:  1.) Vancomycin 1 gram IV q8h, first dose now  2.)  Levaquin 750 mg IV q24h 3.) Cefepime 1 gram q8h  4.) Follow cultures, Monitor renal function, WBC, temp 5.) Vancomycin trough at Css  Leno Mathes, Loma Messing PharmD 9:29 AM 06/23/2012

## 2012-06-24 ENCOUNTER — Inpatient Hospital Stay (HOSPITAL_COMMUNITY): Payer: Medicare Other

## 2012-06-24 DIAGNOSIS — A419 Sepsis, unspecified organism: Secondary | ICD-10-CM

## 2012-06-24 DIAGNOSIS — N390 Urinary tract infection, site not specified: Secondary | ICD-10-CM

## 2012-06-24 DIAGNOSIS — D649 Anemia, unspecified: Secondary | ICD-10-CM

## 2012-06-24 DIAGNOSIS — J189 Pneumonia, unspecified organism: Secondary | ICD-10-CM

## 2012-06-24 DIAGNOSIS — D509 Iron deficiency anemia, unspecified: Secondary | ICD-10-CM

## 2012-06-24 HISTORY — DX: Iron deficiency anemia, unspecified: D50.9

## 2012-06-24 LAB — CBC WITH DIFFERENTIAL/PLATELET
Eosinophils Relative: 0 % (ref 0–5)
HCT: 33.2 % — ABNORMAL LOW (ref 39.0–52.0)
Hemoglobin: 10.5 g/dL — ABNORMAL LOW (ref 13.0–17.0)
Lymphocytes Relative: 5 % — ABNORMAL LOW (ref 12–46)
Lymphs Abs: 0.4 10*3/uL — ABNORMAL LOW (ref 0.7–4.0)
MCH: 30.6 pg (ref 26.0–34.0)
MCV: 96.8 fL (ref 78.0–100.0)
Monocytes Absolute: 0.5 10*3/uL (ref 0.1–1.0)
Monocytes Relative: 7 % (ref 3–12)
Platelets: 218 10*3/uL (ref 150–400)
RBC: 3.43 MIL/uL — ABNORMAL LOW (ref 4.22–5.81)
WBC: 7.3 10*3/uL (ref 4.0–10.5)

## 2012-06-24 LAB — URINE CULTURE: Colony Count: 55000

## 2012-06-24 LAB — COMPREHENSIVE METABOLIC PANEL
AST: 32 U/L (ref 0–37)
Albumin: 1.8 g/dL — ABNORMAL LOW (ref 3.5–5.2)
BUN: 12 mg/dL (ref 6–23)
Calcium: 7.6 mg/dL — ABNORMAL LOW (ref 8.4–10.5)
Creatinine, Ser: 1.06 mg/dL (ref 0.50–1.35)
Total Protein: 5.4 g/dL — ABNORMAL LOW (ref 6.0–8.3)

## 2012-06-24 LAB — MAGNESIUM: Magnesium: 1.8 mg/dL (ref 1.5–2.5)

## 2012-06-24 LAB — LEGIONELLA ANTIGEN, URINE

## 2012-06-24 MED ORDER — SODIUM CHLORIDE 0.45 % IV SOLN
INTRAVENOUS | Status: DC
  Administered 2012-06-24 – 2012-06-29 (×7): via INTRAVENOUS
  Filled 2012-06-24 (×17): qty 1000

## 2012-06-24 MED ORDER — POTASSIUM CHLORIDE 10 MEQ/100ML IV SOLN
10.0000 meq | INTRAVENOUS | Status: AC
  Administered 2012-06-24 (×5): 10 meq via INTRAVENOUS
  Filled 2012-06-24: qty 500

## 2012-06-24 MED ORDER — BIOTENE DRY MOUTH MT LIQD
15.0000 mL | Freq: Two times a day (BID) | OROMUCOSAL | Status: DC
  Administered 2012-06-24 – 2012-06-30 (×11): 15 mL via OROMUCOSAL

## 2012-06-24 MED ORDER — FLUCONAZOLE IN SODIUM CHLORIDE 200-0.9 MG/100ML-% IV SOLN
200.0000 mg | INTRAVENOUS | Status: DC
  Administered 2012-06-24: 200 mg via INTRAVENOUS
  Filled 2012-06-24 (×2): qty 100

## 2012-06-24 MED ORDER — FUROSEMIDE 10 MG/ML IJ SOLN
20.0000 mg | Freq: Once | INTRAMUSCULAR | Status: DC
  Filled 2012-06-24: qty 2

## 2012-06-24 MED ORDER — CHLORHEXIDINE GLUCONATE 0.12 % MT SOLN
15.0000 mL | Freq: Two times a day (BID) | OROMUCOSAL | Status: DC
  Administered 2012-06-24 – 2012-06-30 (×9): 15 mL via OROMUCOSAL
  Filled 2012-06-24 (×15): qty 15

## 2012-06-24 NOTE — H&P (View-Only) (Signed)
Seen today as an ER follow-up.   Active Problems Problems  1. Hydronephrosis On The Right 591 2. Ureteral Stone 592.1  History of Present Illness     56 YO male patient of Dr. Belva Crome seen today for an ER follow-up. Was seen in the ER 05/21/12 for right flank pain. CTU: 7 mm right upper proximal ureteral calculus w/mild hydronephrosis.   S/P: (L) PCNL 3/13.   Interval HX:  Today father states that his son does not appear to be in any further pain but he is unsure if stone has passed. SNF has not informed him if they have seen stone. No hematuria or fever/chills that he is aware of at this time.   Past Medical History Problems  1. 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 History of  Epilepsy 345.90 3. History of  Hyperkalemia 276.7 4. History of  Lymphedema 457.1 5. 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 History of  Metabolic Encephalopathy 348.31 7. History of  Nephrolithiasis V13.01 8. History of  Obesity 278.00 9. History of  Pneumonia V12.61 10. History of  Septicemia 038.9 11. History of  Ulcerative Colitis 556.9  Surgical History Problems  1. History of  Percutaneous Lithotomy  Current Meds 1. Albuterol AERS; Therapy: (Recorded:25Mar2013) to 2. GuaiFENesin TABS; Therapy: (Recorded:25Mar2013) to 3. Lactulose 10 GM/15ML Oral Solution; Therapy: (Recorded:25Mar2013) to 4. Mucinex TB12; Therapy: (Recorded:25Mar2013) to 5. Norco 5-325 MG Oral Tablet; Therapy: (Recorded:25Mar2013) to 6. PHENobarbital 100 MG Oral Tablet; Therapy: (Recorded:25Mar2013) to 7. Phenytoin 125 MG/5ML Oral Suspension; Therapy: (Recorded:25Mar2013) to 8. Potassium Chloride Crys ER 10 MEQ Oral Tablet Extended Release; Therapy:  (Recorded:25Mar2013) to 9. Tylenol TABS; Therapy: (Recorded:25Mar2013) to 10. Vancomycin HCl Powder; Therapy: (Recorded:25Mar2013) to  Allergies Medication  1. No Known Drug Allergies  Social History Denied  1. History of  Alcohol Use 2. History of  Tobacco Use 305.1  Review  of Systems Genitourinary, constitutional, skin, eye, otolaryngeal, hematologic/lymphatic, cardiovascular, pulmonary, endocrine, musculoskeletal, gastrointestinal, neurological and psychiatric system(s) were reviewed and pertinent findings if present are noted.    Vitals Vital Signs [Data Includes: Last 1 Day]  09Jul2013 02:39PM  Weight: 167 lb  Blood Pressure: 93 / 66 Temperature: 97.9 F Heart Rate: 106  Physical Exam Constitutional: Well nourished and well developed . No acute distress. The patient appears well hydrated.  Abdomen: The abdomen is obese. The abdomen is soft and nontender. No suprapubic tenderness.  Skin: Normal skin turgor and normal skin color and pigmentation.  Neuro/Psych:. Mood and affect are appropriate.    Results/Data  Old records or history reviewed: Reviewed ER notes.  The following images/tracing/specimen were independently visualized:  KUB: unable to visualize either kidney or right ureteral calculus due to excessive amount of gas/stool throughout abdomen.    Assessment Assessed  1. Ureteral Stone 592.1 2. Hydronephrosis On The Right 591 3. Nephrolithiasis Of The Right Kidney 592.0  Plan Hydronephrosis On The Right (591), Ureteral Stone (592.1)  1. Follow-up MD/NP/PA Office  Follow-up  Requested for: 09Jul2013 2. CT-OUTSIDE  Requested for: 09Jul2013 Nephrolithiasis Of The Right Kidney (592.0)  3. KUB  Done: 09Jul2013 12:00AM Ureteral Stone (592.1)  4. KUB 5. OUTSIDE RAD MISC   RTC in 1 week for pre-clinic CTU @ [facility] than OV w/Dr. Annabell Howells to discuss if stone still present and treatment plan Instructed SNF to give Mag Citrate 1/2 bottle now-if no BM in 4 hrs repeat than begin Miralax 17 gms po daily Tamsulosin 0.4 mg 1 po daily Strain urine. If stone captured send w/pt  at f/u appt

## 2012-06-24 NOTE — Progress Notes (Signed)
Patient ID: Charles Humphrey, male   DOB: 11-23-1956, 55 y.o.   MRN: [id number]  Charles Humphrey still has some intermittant fever but his WBC count is falling.     I will leave his stent and foley for one more day.

## 2012-06-24 NOTE — Progress Notes (Signed)
Patient had incontinent BM. Found pt pulling at sutures around head of penis and dressing had fallen off. Placed a mitten to protect from further pulling. Paged Dr. Marcello Fennel to notify of the loose sutures. No return phone call received.

## 2012-06-24 NOTE — Progress Notes (Signed)
INITIAL ADULT NUTRITION ASSESSMENT Date: 06/24/2012   Time: 3:53 PM Reason for Assessment: Nutrition Risk for dysphagia  ASSESSMENT: Male 56 y.o.  Dx: Sepsis  Hx:  Past Medical History  Diagnosis Date  . Ulcerative colitis, unspecified   . Metabolic encephalopathy   . Unspecified intellectual disabilities   . Obesity, unspecified   . Hyperpotassemia   . Other convulsions   . Other lymphedema   . Unspecified constipation   . Other physical therapy   . Muscle weakness (generalized)   . Nephrolithiasis 01/12/2012  . Hydronephrosis 01/12/2012  . Healthcare-associated pneumonia 01/12/2012  . Intellectual disability     Related Meds:  Scheduled Meds:   . albuterol  2.5 mg Nebulization Q6H  . antiseptic oral rinse  15 mL Mouth Rinse q12n4p  . ceFEPime (MAXIPIME) IV  1 g Intravenous Q8H  . chlorhexidine  15 mL Mouth Rinse BID  . enoxaparin (LOVENOX) injection  40 mg Subcutaneous Q24H  . fluconazole (DIFLUCAN) IV  200 mg Intravenous Q24H  . levofloxacin (LEVAQUIN) IV  750 mg Intravenous Q24H  . PHENObarbital  130 mg Intravenous QHS  . phenytoin (DILANTIN) IV  100 mg Intravenous Q8H  . potassium chloride  10 mEq Intravenous Q1 Hr x 3  . potassium chloride  10 mEq Intravenous Q1 Hr x 5  . sodium chloride  1,000 mL Intravenous Once  . sodium chloride  10-40 mL Intracatheter Q12H  . vancomycin  1,000 mg Intravenous Once  . vancomycin  1,000 mg Intravenous Q8H  . DISCONTD: furosemide  20 mg Intravenous Once  . DISCONTD: PHENobarbital  64.8 mg Oral QHS  . DISCONTD: PHENobarbital  97.2 mg Oral QHS   Continuous Infusions:   . sodium chloride 0.45 % with kcl 75 mL/hr at 06/24/12 1130  . DISCONTD: 0.9 % NaCl with KCl 40 mEq / L Stopped (06/24/12 0905)   PRN Meds:.acetaminophen, ondansetron, sodium chloride   Ht: 5' 6.14" (168 cm)  Wt: 180 lb 8.9 oz (81.9 kg)  Ideal Wt: 64.54 % Ideal Wt: 126.7% Wt Readings from Last 10 Encounters:  06/23/12 180 lb 8.9 oz (81.9 kg)  06/22/12  180 lb 8 oz (81.874 kg)  06/22/12 180 lb 8 oz (81.874 kg)  06/17/12 186 lb 4.6 oz (84.5 kg)  06/17/12 186 lb 4.6 oz (84.5 kg)  06/17/12 186 lb 4.6 oz (84.5 kg)  02/05/12 185 lb 3 oz (84 kg)  02/05/12 185 lb 3 oz (84 kg)  01/11/12 200 lb (90.719 kg)  *Weight down 20 lb over 5 months, 10% from baseline.   Body mass index is 29.02 kg/(m^2). (Overweight)  Food/Nutrition Related Hx: Patient is non-verbal. Spoke with patient's parent's. Patient's father reported patient was eating poorly PTA, that's how he knew pt was sick. He reported patient eats well when he is not sick. Patient's weight has been trending down. Pt father reported pt only drinks Ensure if he does not eat well at meals. Noted, per RD patient was eating < 50% of estimated energy needs for 5 days prior to admission in July.   Labs:  CMP     Component Value Date/Time   NA 146* 06/24/2012 0355   K 3.4* 06/24/2012 0355   CL 115* 06/24/2012 0355   CO2 24 06/24/2012 0355   GLUCOSE 155* 06/24/2012 0355   BUN 12 06/24/2012 0355   CREATININE 1.06 06/24/2012 0355   CALCIUM 7.6* 06/24/2012 0355   PROT 5.4* 06/24/2012 0355   ALBUMIN 1.8* 06/24/2012 0355   AST 32 06/24/2012  0355   ALT 20 06/24/2012 0355   ALKPHOS 101 06/24/2012 0355   BILITOT 0.2* 06/24/2012 0355   GFRNONAA 78* 06/24/2012 0355   GFRAA >90 06/24/2012 0355    Intake/Output Summary (Last 24 hours) at 06/24/12 1608 Last data filed at 06/24/12 1359  Gross per 24 hour  Intake 4635.25 ml  Output   2475 ml  Net 2160.25 ml     Diet Order: NPO  Supplements/Tube Feeding: none at this time  IVF:    sodium chloride 0.45 % with kcl Last Rate: 75 mL/hr at 06/24/12 1130  DISCONTD: 0.9 % NaCl with KCl 40 mEq / L Last Rate: Stopped (06/24/12 0905)    Estimated Nutritional Needs:   Kcal: 1800-1900 Protein: 85-100 grams Fluid: 1 ml per kcal intake  NUTRITION DIAGNOSIS: -Inadequate oral intake (NI-2.1).  Status: Ongoing  RELATED TO: inability to eat  AS EVIDENCE BY: NPO  status  MONITORING/EVALUATION(Goals): Diet advancements/ tolerance, weights, labs 1. Recommend diet advancement as medically able to meet > 90% of estimated energy needs.  2. Minimize weight loss.   EDUCATION NEEDS: -No education needs identified at this time  INTERVENTION: 1. Recommend diet advancement as medically able. Recommend order patient Ensure PRN once diet is advanced for patient to consume when PO intake is poor.  2. RD to follow for nutrition plan of care.   Dietitian [phone]  DOCUMENTATION CODES Per approved criteria  -Severe malnutrition in the context of acute illness or injury  *Patient meets malnutrition criteria due to PO intake meting < 50% of estimated energy needs for > 5 days and unintentional weight loss of 10% from baseline over 5 months.   Iven Finn [facility] 06/24/2012, 3:53 PM

## 2012-06-24 NOTE — Progress Notes (Signed)
[facility] PROGRESS NOTE  Charles Humphrey YQM:[id number] DOB: 06-14-1956 DOA: 06/22/2012 PCP: Kaleen Mask, MD  Assessment/Plan: Principal Problem:  *Sepsis Active Problems:  Healthcare-associated pneumonia  Epilepsy  Mental retardation  Nephrolithiasis  Hydronephrosis  Leukocytosis  Hypokalemia  UTI (lower urinary tract infection)  Anemia  #1 sepsis Likely secondary to bacteremia from recent bladder instrumentation versus secondary to urinary tract infection versus questionable healthcare associated pneumonia. Patient's tachycardia is improving. Patient's white count is trending down. Patient still spiking fevers as high as 102.7 yesterday. Respiratory rate is improving. Blood cultures are pending. Sputum cultures pending. Urine cultures are pending. Continue empiric IV vancomycin, Levaquin and cefepime. Monitor.  #2 urinary tract infection Urine cultures are pending. Continue empiric IV Levaquin.  #3 questionable healthcare associated pneumonia On admission and per family patient did have a productive cough and some congestion and also question of possible aspiration pneumonia. Patient was unable to be evaluated by the speech therapist yesterday secondary to his minimally responsive state and a such will be reevaluated in the next one to 2 days. Chest x-ray does show some congestion. Patient had a fever yesterday of 102.7. Sputum cultures pending. Will continue empiric IV vancomycin Levaquin and cefepime for now. Will decrease IV fluids to 75 cc per hour. Will give a dose of IV Lasix x1 and monitor.  #4 status post right renal pelvic stone laser lithotripsy Dr. Annabell Howells of urology is following and plan is once patient is afebrile Foley catheter will be removed and stent removed per urology. Urology is following and appreciate input and recommendations.  #5 hypokalemia Replete.  #6 anemia Likely secondary to an anemia of chronic disease with a dilutional component.  Per urology minimal blood loss. Patient does not have any gross GI bleed. Will check an anemia panel. Will guaiac stools. Follow H&H.  #7 history of seizure/epilepsy No seizures noted. Phenytoin level was 13.4. Corrected phenytoin level is 20.3. Continue IV phenytoin per pharmacy. Once patient is able to tolerate oral intake will change IV phenytoin 2 liquid phenytoin.  #8 mental retardation Stable.  #9 prophylaxis Lovenox for DVT prophylaxis.   Code Status: DO NOT RESUSCITATE Family Communication: No family at bedside. Patient is nonverbal. Disposition Plan: Back to nursing home when medically stable   Brief narrative: Charles Humphrey is a 56 yo wm, [facility] resident, admitted via [facility] for fever, pneumonia, and possible urosepsis, 1 day following R ureteroscopy and laser fragmentation of R renal pelvic stone, with placement of JJ stent and foley catheter. Stent and catheter ate to be left for 48 hrs, to be removed together in the outpatient setting.  The patient underwent Left percutaneous nephrolithotomy for multiple large renal stones in March, 2013 ( aggregate > 2cm). At the time he was noted to have an asymptomatic 6mm R renal pelvic stone. However, he developed pain ( father's interpretation), and repeat evaluation showed stone size increased to 7mm, with R hydronephrosis at the [facility]. He failed conservative therapy, and underwent R ureteroscopy and laser lithotripsy per Dr. Annabell Howells on 06/22/12. JJ stent left in place, taped to a foley catheter. However , pt developed a fever, and lung congestion last night and was brought to [facility] by [facility] for care. He is admitted to [facility].    Consultants:  Urology: Dr. Patsi Sears 06/23/2012 being followed by Dr. Annabell Howells  Procedures:  Chest x-ray 06/23/2012  Chest x-ray 06/24/2012  Antibiotics:  IV Maxipime 06/23/2012  IV Levaquin 06/23/2012  IV vancomycin 06/23/2012  HPI/Subjective: Patient  opens eyes to  verbal stimuli however is nonverbal. A little more alert today than yesterday.  Objective: Filed Vitals:   06/24/12 0400 06/24/12 0500 06/24/12 0700 06/24/12 0800  BP: 103/66 101/58 107/70   Pulse: 114 111 111 108  Temp: 98.4 F (36.9 C)   99.2 F (37.3 C)  TempSrc: Axillary   Axillary  Resp: 22  24 23   Height:      Weight:      SpO2: 99% 100% 99% 94%    Intake/Output Summary (Last 24 hours) at 06/24/12 1052 Last data filed at 06/24/12 1610  Gross per 24 hour  Intake 3247.33 ml  Output   1900 ml  Net 1347.33 ml    Exam: General: Patient opens eyes to verbal stimuli however is nonverbal and a little more alert today. HEENT: No bruits, no goiter. Heart: Tachycardia, without murmurs, rubs, gallops. Lungs: Bibasilar crackles Abdomen: Soft, nontender, nondistended, positive bowel sounds. Extremities: No clubbing cyanosis or edema with positive pedal pulses. Neuro: Grossly intact, nonfocal.    Data Reviewed: Basic Metabolic Panel:  Lab 06/24/12 9604 06/24/12 0355 06/23/12 0014 06/23/12 0013  NA -- 146* 144 --  K -- 3.4* 3.3* --  CL -- 115* 108 --  CO2 -- 24 27 --  GLUCOSE -- 155* 149* --  BUN -- 12 7 --  CREATININE -- 1.06 0.58 0.60  CALCIUM -- 7.6* 8.5 --  MG 1.8 -- -- 1.8  PHOS -- -- -- --   Liver Function Tests:  Lab 06/24/12 0355 06/23/12 0013  AST 32 19  ALT 20 11  ALKPHOS 101 133*  BILITOT 0.2* 0.2*  PROT 5.4* 7.1  ALBUMIN 1.8* 2.8*   No results found for this basename: LIPASE:5,AMYLASE:5 in the last 168 hours No results found for this basename: AMMONIA:5 in the last 168 hours CBC:  Lab 06/24/12 0355 06/23/12 0014 06/23/12 0013  WBC 7.3 14.6* 14.0*  NEUTROABS 6.5 -- --  HGB 10.5* 14.0 14.6  HCT 33.2* 42.5 45.5  MCV 96.8 96.2 98.9  PLT 218 244 248   Cardiac Enzymes:  Lab 06/23/12 0014  CKTOTAL --  CKMB --  CKMBINDEX --  TROPONINI <0.30   BNP (last 3 results) No results found for this basename: PROBNP:3 in the last 8760 hours CBG: No  results found for this basename: GLUCAP:5 in the last 168 hours  Recent Results (from the past 240 hour(s))  CULTURE, BLOOD (ROUTINE X 2)     Status: Normal   Collection Time   06/16/12  7:55 PM      Component Value Range Status Comment   Specimen Description BLOOD RIGHT HAND   Final    Special Requests BOTTLES DRAWN AEROBIC ONLY 3CC   Final    Culture  Setup Time 06/17/2012 03:32   Final    Culture NO GROWTH 5 DAYS   Final    Report Status 06/23/2012 FINAL   Final   CULTURE, BLOOD (ROUTINE X 2)     Status: Normal   Collection Time   06/16/12  8:00 PM      Component Value Range Status Comment   Specimen Description BLOOD LEFT HAND   Final    Special Requests BOTTLES DRAWN AEROBIC ONLY 3CC   Final    Culture  Setup Time 06/17/2012 03:32   Final    Culture NO GROWTH 5 DAYS   Final    Report Status 06/23/2012 FINAL   Final   URINE CULTURE     Status: Normal  Collection Time   06/16/12  8:08 PM      Component Value Range Status Comment   Specimen Description URINE, CATHETERIZED   Final    Special Requests NONE   Final    Culture  Setup Time 06/17/2012 03:24   Final    Colony Count NO GROWTH   Final    Culture NO GROWTH   Final    Report Status 06/17/2012 FINAL   Final   MRSA PCR SCREENING     Status: Normal   Collection Time   06/17/12 10:30 AM      Component Value Range Status Comment   MRSA by PCR NEGATIVE  NEGATIVE Final   CULTURE, BLOOD (ROUTINE X 2)     Status: Normal (Preliminary result)   Collection Time   06/23/12 12:13 AM      Component Value Range Status Comment   Specimen Description BLOOD RIGHT HAND   Final    Special Requests BOTTLES DRAWN AEROBIC ONLY   Final    Culture  Setup Time 06/23/2012 03:52   Final    Culture     Final    Value:        BLOOD CULTURE RECEIVED NO GROWTH TO DATE CULTURE WILL BE HELD FOR 5 DAYS BEFORE ISSUING A FINAL NEGATIVE REPORT   Report Status PENDING   Incomplete   CULTURE, BLOOD (ROUTINE X 2)     Status: Normal (Preliminary result)     Collection Time   06/23/12  1:10 AM      Component Value Range Status Comment   Specimen Description BLOOD RIGHT   Final    Special Requests BOTTLES DRAWN AEROBIC ONLY 3CC   Final    Culture  Setup Time 06/23/2012 03:52   Final    Culture     Final    Value:        BLOOD CULTURE RECEIVED NO GROWTH TO DATE CULTURE WILL BE HELD FOR 5 DAYS BEFORE ISSUING A FINAL NEGATIVE REPORT   Report Status PENDING   Incomplete   MRSA PCR SCREENING     Status: Normal   Collection Time   06/23/12  9:12 AM      Component Value Range Status Comment   MRSA by PCR NEGATIVE  NEGATIVE Final      Studies: Ct Abdomen Pelvis Wo Contrast  06/04/2012  *RADIOLOGY REPORT*  Clinical Data: History kidney stones.  Reevaluation  CT ABDOMEN AND PELVIS WITHOUT CONTRAST  Technique:  Multidetector CT imaging of the abdomen and pelvis was performed following the standard protocol without intravenous contrast.  Comparison: CT 05/21/2012  Findings:  Renal:  Non-IV contrast images demonstrate a partially obstructing calculus at the right ureteral pelvic junction measuring 9 mm not changed from prior.  There is mild hydronephrosis of the right which is similar to prior.  There is nonobstructing 5 mm calculus in the upper pole right kidney.  No distal right ureteral calculi. In the left kidney, there are three nonobstructing calculi ranging in size from 3 to  4 mm.  No obstructive uropathy on the left.  No left ureterolithiasis.  Lung bases are clear.  No pericardial fluid.  No focal hepatic lesion.  The gallbladder is collapsed.  The pancreas, spleen, adrenal gland are normal.  The stomach is gas filled similar to prior.  Small bowel and cecum are normal.  The rectum and sigmoid colon is smooth similar to prior with mild wall thickening.  Abdominal aorta normal caliber.  No retroperitoneal  periportal lymphadenopathy.  No free fluid the pelvis.  The prostate gland is normal.  The bladder is mildly distended.  No evidence of bladder stones.  Review of  bone windows demonstrates no aggressive osseous lesions.  IMPRESSION:  1.  Partially obstructing calculus at the right ureteral pelvic junction is not changed from prior. 2.  Bilateral nephrolithiasis.  3.   Smooth contour to the sigmoid colon and proximal rectum could represent chronic inflammation.  There is some retention of stool in the rectum.  Original Report Authenticated By: Genevive Bi, M.D.   Dg Chest 2 View  06/23/2012  *RADIOLOGY REPORT*  Clinical Data: Tachycardia, fever, shortness of breath.  CHEST - 2 VIEW  Comparison: 06/16/2012  Findings: Hypoaeration with interstitial vascular crowding. Prominent cardiomediastinal contours, similar to prior.  Central vascular congestion.  Mild lung base opacities.  Otherwise, no focal consolidation.  No pleural effusion or pneumothorax. Osteopenia.  IMPRESSION: Hypoaeration and mild bibasilar opacities; atelectasis versus infiltrate.  Prominent cardiomediastinal contours are similar to prior.  Original Report Authenticated By: Waneta Martins, M.D.   Dg Chest 2 View  06/16/2012  *RADIOLOGY REPORT*  Clinical Data: Fever.  Short of breath.  CHEST - 2 VIEW  Comparison: 02/05/2012  Findings: Lungs are markedly under aerated with bibasilar atelectasis.  Heart is upper normal in size allowing for low volumes.  There is persistent marked distention of the transverse colon and probably associated small bowel loops as previously seen. No obvious free intraperitoneal gas.  IMPRESSION: Bibasilar atelectasis.  Stable colonic distention.  Original Report Authenticated By: [name], M.D.   Dg Abd 1 View  06/17/2012  *RADIOLOGY REPORT*  Clinical Data: Right ureteral calculus and fever.  ABDOMEN - 1 VIEW  Comparison: CT of the abdomen dated 06/04/2012  Findings: Single image was obtained intraoperatively with a C-arm and saved.  This shows the superior aspect of a ureteral stent which extends up into the expected position of the central collecting  system.  IMPRESSION: Imaging during right ureteral stent placement.  Original Report Authenticated By: Reola Calkins, M.D.   Dg Chest Port 1 View  06/24/2012  *RADIOLOGY REPORT*  Clinical Data: Evaluate bibasilar opacities  PORTABLE CHEST - 1 VIEW  Comparison: 06/23/2012; 06/16/2012  Findings: Grossly unchanged enlarged cardiac silhouette and mediastinal contours.  Interval placement of a right upper extremity approach PICC line with tip overlying the superior cavoatrial junction.  Lung volumes remain persistently reduced. Pulmonary vasculature is indistinct with cephalization of flow. Minimal increase in bilateral infrahilar heterogeneous opacities. No definite pleural effusion or pneumothorax.  Unchanged bones.  IMPRESSION: 1.  Right upper extremity approach PICC line tip overlies the superior cavoatrial junction. 2. Minimal increase in bilateral infrahilar heterogeneous opacities, atelectasis versus infiltrate.  Further evaluation with a PA and lateral chest radiograph may be obtained as clinically indicated. 3.  Suspect mild pulmonary edema.  Original Report Authenticated By: Waynard Reeds, M.D.    Scheduled Meds:   . albuterol  2.5 mg Nebulization Q6H  . antiseptic oral rinse  15 mL Mouth Rinse q12n4p  . ceFEPime (MAXIPIME) IV  1 g Intravenous Q8H  . chlorhexidine  15 mL Mouth Rinse BID  . enoxaparin (LOVENOX) injection  40 mg Subcutaneous Q24H  . levofloxacin (LEVAQUIN) IV  750 mg Intravenous Q24H  . PHENObarbital  130 mg Intravenous QHS  . phenytoin (DILANTIN) IV  100 mg Intravenous Q8H  . potassium chloride  10 mEq Intravenous Q1 Hr x 3  . potassium chloride  10 mEq Intravenous Q1 Hr x 5  . sodium chloride  1,000 mL Intravenous Once  . sodium chloride  10-40 mL Intracatheter Q12H  . vancomycin  1,000 mg Intravenous Once  . vancomycin  1,000 mg Intravenous Q8H  . DISCONTD: PHENobarbital  64.8 mg Oral QHS  . DISCONTD: PHENobarbital  97.2 mg Oral QHS  . DISCONTD: phenytoin  175 mg  Oral Q12H   Continuous Infusions:   . sodium chloride 0.45 % with kcl 100 mL/hr at 06/24/12 0906  . DISCONTD: 0.9 % NaCl with KCl 40 mEq / L Stopped (06/24/12 0905)    Principal Problem:  *Sepsis Active Problems:  Healthcare-associated pneumonia  Epilepsy  Mental retardation  Nephrolithiasis  Hydronephrosis  Leukocytosis  Hypokalemia  UTI (lower urinary tract infection)  Anemia    Time spent: 40 mins    [facility]  [facility] Pager [phone]. If 8PM-8AM, please contact night-coverage at [email or web address], password [facility] 06/24/2012, 10:52 AM  LOS: 2 days

## 2012-06-24 NOTE — Progress Notes (Deleted)
Speech Language Pathology Dysphagia Treatment Patient Details Name: Charles Humphrey MRN: [id number] DOB: 01-01-1956 Today's Date: 06/24/2012 Time: 1210-1240 SLP Time Calculation (min): 30 min  Assessment / Plan / Recommendation Clinical Impression  Pt observed today being fed by father, pt initially turned head to avoid intake but once oral cavity was infiltrated, pt was more accepting.  Observed pt with one ounce water, 4 ounces cranberry juice,  two boluses of applesauce without clinical s/s of aspiration.  Pt with baseline lingual thrusting resulting in incoordinated oral transit.  Family now reports pt placing middle of tongue to palate to indicate declination of intake.  Pt needs much encouragement to accept po, but his swallow function appears consistent with previous evaluation findings.  Recommend  pt initiate a full liquid diet with SLP monitoring for advancement.  Aspiration risk is chronic for this pt due to his chronic dysphagia and multiple risk factors (reliance on others for feeding, bedridden status, etc).  Father concurs pt's swallow ability is at baseline.      Diet Recommendation  Initiate / Change Diet: Other (comment) (full liquid diet)    SLP Plan Continue with current plan of care   Pertinent Vitals/Pain Afebrile currently, decreased   Swallowing Goals  SLP Swallowing Goals Patient will utilize recommended strategies during swallow to increase swallowing safety with: Total assistance  General Temperature Spikes Noted: No (low grade 99.2 at 0800) Behavior/Cognition: Alert;Doesn't follow directions;Decreased sustained attention (due to intellectual impairment from seizures) Oral Cavity - Dentition: Adequate natural dentition Patient Positioning: Upright in bed  Oral Cavity - Oral Hygiene  Father reports pt had just received oral care prior to SLP visit.   Dysphagia Treatment Treatment focused on: Patient/family/caregiver education Family/Caregiver Educated:  father and significant other Treatment Methods/Modalities: Skilled observation Feeding: Total assist Liquids provided via: Straw;Teaspoon Oral Phase Signs & Symptoms: Other (comment) (lingual thrust) Amount of cueing: Total (total assist)   GO    [name], MS [facility] SLP [phone]

## 2012-06-24 NOTE — Evaluation (Signed)
Clinical/Bedside Swallow Evaluation Patient Details  Name: Charles Humphrey MRN: [id number] Date of Birth: 11/10/1956  Today's Date: 06/24/2012 Time: 1210-1240 SLP Time Calculation (min): 30 min  Past Medical History:  Past Medical History  Diagnosis Date  . Ulcerative colitis, unspecified   . Metabolic encephalopathy   . Unspecified intellectual disabilities   . Obesity, unspecified   . Hyperpotassemia   . Other convulsions   . Other lymphedema   . Unspecified constipation   . Other physical therapy   . Muscle weakness (generalized)   . Nephrolithiasis 01/12/2012  . Hydronephrosis 01/12/2012  . Healthcare-associated pneumonia 01/12/2012  . Intellectual disability    Past Surgical History:  Past Surgical History  Procedure Date  . Nephrolithotomy 02/05/2012    Procedure: NEPHROLITHOTOMY PERCUTANEOUS;  Surgeon: Anner Crete, MD;  Location: [facility];  Service: Urology;  Laterality: Left;  left kidney  . Fracture right arm    . Fracture left femur    . Cystoscopy w/ ureteral stent placement 06/17/12  . Cystoscopy/retrograde/ureteroscopy 06/22/2012    Procedure: CYSTOSCOPY/RETROGRADE/URETEROSCOPY;  Surgeon: Anner Crete, MD;  Location: [facility];  Service: Urology;  Laterality: Right;  . Cystoscopy w/ ureteral stent placement 06/22/2012    Procedure: CYSTOSCOPY WITH STENT REPLACEMENT;  Surgeon: Anner Crete, MD;  Location: [facility];  Service: Urology;  Laterality: Right;   HPI:  56 yo male adm to [facility] with fever, + urosepsis, AMS.  Pt with h/o seizures with intellectual disability and resides in SNF.  Bedside swallow evaluation ordered by Md.  SLP attempted to complete eval yesterday but pt was not alert enough.     Assessment / Plan / Recommendation Clinical Impression  Pt observed today being fed by father, pt initially turned head to avoid intake but once oral cavity was infiltrated, pt was more accepting.  Observed pt with one ounce water, 4 ounces cranberry juice,  two boluses of  applesauce without clinical s/s of aspiration.  Pt with baseline lingual thrusting resulting in incoordinated oral transit.  Family now reports pt placing middle of tongue to palate to indicate declination of intake.  Pt needs much encouragement to accept po, but his swallow function appears consistent with previous evaluation findings.  Recommend  pt initiate a full liquid diet with SLP monitoring for advancement with improved LOA/acceptane of po.  Aspiration risk is chronic for this pt due to his chronic dysphagia and multiple risk factors (reliance on others for feeding, bedridden status, etc).  Father concurs pt's swallow ability is at baseline.      Aspiration Risk  Moderate    Diet Recommendation Thin liquid;Nectar-thick liquid (full liquid)   Liquid Administration via: Straw Medication Administration: Crushed with puree (or via iv or liquid- crushed if not contraindicated) Supervision: Full supervision/cueing for compensatory strategies Compensations: Slow rate;Small sips/bites Postural Changes and/or Swallow Maneuvers: Seated upright 90 degrees;Upright 30-60 min after meal    Other  Recommendations Oral Care Recommendations: Oral care before and after PO   Follow Up Recommendations  Skilled Nursing facility    Frequency and Duration min 2x/week  2 weeks   Pertinent Vitals/Pain Low grade fever, lungs decr, on oxygen    SLP Swallow Goals Patient will utilize recommended strategies during swallow to increase swallowing safety with: Total assistance   Swallow Study Prior Functional Status       General Date of Onset: 06/24/12 HPI: 56 yo male adm to [facility] with fever, + urosepsis, AMS.  Pt with h/o seizures with intellectual disability and  resides in SNF.  Bedside swallow evaluation ordered by Md.  SLP attempted to complete eval yesterday but pt was not alert enough.   Type of Study: Bedside swallow evaluation Previous Swallow Assessment: Pt known to this slp from previous  admissions Diet Prior to this Study: NPO Temperature Spikes Noted: No (low grade 99.2 at 0800) Respiratory Status: Supplemental O2 delivered via (comment) (pt continually keeps trying to remove oxygen) Behavior/Cognition: Alert;Doesn't follow directions;Decreased sustained attention (due to intellectual impairment from seizures) Oral Cavity - Dentition: Adequate natural dentition Self-Feeding Abilities: Total assist Patient Positioning: Upright in bed Baseline Vocal Quality: Other (comment) (pt nonverbal- grunts at times when stimulated) Volitional Cough: Cognitively unable to elicit Volitional Swallow: Unable to elicit    Oral/Motor/Sensory Function Overall Oral Motor/Sensory Function: Impaired at baseline (pt with cognitive deficits, lingual thrust at baseline, weak) Velum:  (pt did not open mouth to allow slp to assess)   Ice Chips Ice chips: Not tested Other Comments: due to asp risk   Thin Liquid Thin Liquid: Impaired Presentation: Straw;Spoon (fed by father) Oral Phase Impairments: Impaired anterior to posterior transit;Reduced lingual movement/coordination;Poor awareness of bolus (lingual thrust) Oral Phase Functional Implications: Prolonged oral transit Other Comments: swallow appeared timely once pt would accept bolus, father held head and attempted several times to get bolus into pt's mouth    Nectar Thick Nectar Thick Liquid: Not tested   Honey Thick Honey Thick Liquid: Not tested   Puree Puree: Impaired Presentation: Spoon Oral Phase Impairments: Reduced lingual movement/coordination;Impaired anterior to posterior transit Oral Phase Functional Implications: Prolonged oral transit Other Comments: pt required total cues/encouragement- father placing bolus in pt's mouth when he would open his mouth, pt turned head away from boluses- took only few bites-   Solid Solid: Impaired Oral Phase Impairments: Poor awareness of bolus;Reduced lingual movement/coordination Oral Phase  Functional Implications: Oral residue;Oral holding;Other (comment) (anterior pocketing-removed by father as pt did not masticate) Pharyngeal Phase Impairments:  (did not transit bolus into pharynx)   [name], MS [facility] SLP [phone]

## 2012-06-24 NOTE — Progress Notes (Signed)
CRITICAL VALUE ALERT  Critical value received:  Vanc Trough: 31.7  Date of notification:  06/24/2012   Time of notification:  1844  Critical value read back:yes  Nurse who received alert:  Everette Rank, RN  MD notified (1st page):  Janee Morn & Pharmacist  Time of first page:  1846  MD notified (2nd page):  Time of second page:  Responding MD:  1846  Time MD responded:  Pharmacist   Pharmacist instructed not to give 1800 dose.

## 2012-06-24 NOTE — Progress Notes (Signed)
Clinical Social Work Department BRIEF PSYCHOSOCIAL ASSESSMENT 06/24/2012  Patient:  Charles Humphrey, Charles Humphrey     Account Number:  [id number]     Admit date:  06/22/2012  Clinical Social Worker:  Orpah Greek  Date/Time:  06/24/2012 11:12 AM  Referred by:  Physician  Date Referred:  06/24/2012 Referred for  Other - See comment   Other Referral:   Admitted from: [facility]   Interview type:  Family Other interview type:    PSYCHOSOCIAL DATA Living Status:  FACILITY Admitted from facility:  [facility] Level of care:  Skilled Nursing Facility Primary support name:  Edan Juday (father) h#: [phone] c#: [phone] Primary support relationship to patient:  PARENT Degree of support available:   great    CURRENT CONCERNS Current Concerns  Post-Acute Placement   Other Concerns:    SOCIAL WORK ASSESSMENT / PLAN CSW spoke with patient's father, Ralphie Lovelady re: discharge planning. Patient was admitted from [facility] where he plans to return at discharge.   Assessment/plan status:  Information/Referral to [facility] Other assessment/ plan:   Information/referral to community resources:   CSW completed FL2 and faxed information to facility, confirmed with Jill Side @ SNF that he is ok to return when ready.    PATIENT'S/FAMILY'S RESPONSE TO PLAN OF CARE: Patient's father is very supportive and involved, always at his son's bedside. He is very pleased with [facility] and is eager to get him back there.        Unice Bailey, LCSW [facility] Clinical Social Worker cell #: [phone]

## 2012-06-24 NOTE — Interval H&P Note (Signed)
History and Physical Interval Note:  06/24/2012 4:59 PM  Charles Humphrey  has presented today for surgery, with the diagnosis of right ureteral stone  The various methods of treatment have been discussed with the patient and family. After consideration of risks, benefits and other options for treatment, the patient has consented to  Procedure(s) (LRB): CYSTOSCOPY/RETROGRADE/URETEROSCOPY (Right) CYSTOSCOPY WITH STENT REPLACEMENT (Right) HOLMIUM LASER APPLICATION (Right) as a surgical intervention .  The patient's history has been reviewed, patient examined, no change in status, stable for surgery.  I have reviewed the patient's chart and labs.  Questions were answered to the patient's satisfaction.     [name]  This was for his procedure done on 7/30

## 2012-06-24 NOTE — Progress Notes (Signed)
ANTIBIOTIC CONSULT NOTE - INITIAL  Pharmacy Consult for: Vancomycin/Levaquin/Cefepime Indication: HCAP/UTI  Allergies  Allergen Reactions  . Other Other (See Comments)    No dairy products    Patient Measurements: Height: 5' 6.14" (168 cm) Weight: 180 lb 8.9 oz (81.9 kg) IBW/kg (Calculated) : 64.13    Vital Signs: Temp: 98.7 F (37.1 C) (08/01 1600) Temp src: Oral (08/01 1600) BP: 124/83 mmHg (08/01 1500) Pulse Rate: 111  (08/01 1500) Intake/Output from previous day: 07/31 0701 - 08/01 0700 In: 3237.3 [I.V.:1533.3; IV Piggyback:1704] Out: 2075 [Urine:2075] Intake/Output from this shift: Total I/O In: 1799.9 [I.V.:847.9; IV Piggyback:952] Out: 2500 [Urine:2500]  Labs:  [facility] 06/24/12 0355 06/23/12 0014 06/23/12 0013  WBC 7.3 14.6* 14.0*  HGB 10.5* 14.0 14.6  PLT 218 244 248  LABCREA -- -- --  CREATININE 1.06 0.58 0.60   Estimated Creatinine Clearance: 81.2 ml/min (by C-G formula based on Cr of 1.06).  Basename 06/24/12 1726  VANCOTROUGH 31.7*  VANCOPEAK --  Drue Dun --  GENTTROUGH --  GENTPEAK --  GENTRANDOM --  TOBRATROUGH --  TOBRAPEAK --  TOBRARND --  AMIKACINPEAK --  AMIKACINTROU --  AMIKACIN --     Microbiology: Recent Results (from the past 720 hour(s))  CULTURE, BLOOD (ROUTINE X 2)     Status: Normal   Collection Time   06/16/12  7:55 PM      Component Value Range Status Comment   Specimen Description BLOOD RIGHT HAND   Final    Special Requests BOTTLES DRAWN AEROBIC ONLY 3CC   Final    Culture  Setup Time 06/17/2012 03:32   Final    Culture NO GROWTH 5 DAYS   Final    Report Status 06/23/2012 FINAL   Final   CULTURE, BLOOD (ROUTINE X 2)     Status: Normal   Collection Time   06/16/12  8:00 PM      Component Value Range Status Comment   Specimen Description BLOOD LEFT HAND   Final    Special Requests BOTTLES DRAWN AEROBIC ONLY 3CC   Final    Culture  Setup Time 06/17/2012 03:32   Final    Culture NO GROWTH 5 DAYS   Final    Report  Status 06/23/2012 FINAL   Final   URINE CULTURE     Status: Normal   Collection Time   06/16/12  8:08 PM      Component Value Range Status Comment   Specimen Description URINE, CATHETERIZED   Final    Special Requests NONE   Final    Culture  Setup Time 06/17/2012 03:24   Final    Colony Count NO GROWTH   Final    Culture NO GROWTH   Final    Report Status 06/17/2012 FINAL   Final   MRSA PCR SCREENING     Status: Normal   Collection Time   06/17/12 10:30 AM      Component Value Range Status Comment   MRSA by PCR NEGATIVE  NEGATIVE Final   CULTURE, BLOOD (ROUTINE X 2)     Status: Normal (Preliminary result)   Collection Time   06/23/12 12:13 AM      Component Value Range Status Comment   Specimen Description BLOOD RIGHT HAND   Final    Special Requests BOTTLES DRAWN AEROBIC ONLY   Final    Culture  Setup Time 06/23/2012 03:52   Final    Culture     Final    Value:  BLOOD CULTURE RECEIVED NO GROWTH TO DATE CULTURE WILL BE HELD FOR 5 DAYS BEFORE ISSUING A FINAL NEGATIVE REPORT   Report Status PENDING   Incomplete   CULTURE, BLOOD (ROUTINE X 2)     Status: Normal (Preliminary result)   Collection Time   06/23/12  1:10 AM      Component Value Range Status Comment   Specimen Description BLOOD RIGHT   Final    Special Requests BOTTLES DRAWN AEROBIC ONLY 3CC   Final    Culture  Setup Time 06/23/2012 03:52   Final    Culture     Final    Value:        BLOOD CULTURE RECEIVED NO GROWTH TO DATE CULTURE WILL BE HELD FOR 5 DAYS BEFORE ISSUING A FINAL NEGATIVE REPORT   Report Status PENDING   Incomplete   URINE CULTURE     Status: Normal   Collection Time   06/23/12  4:26 AM      Component Value Range Status Comment   Specimen Description URINE, RANDOM   Final    Special Requests NONE   Final    Culture  Setup Time 06/23/2012 09:03   Final    Colony Count 55,000 COLONIES/ML   Final    Culture YEAST   Final    Report Status 06/24/2012 FINAL   Final   MRSA PCR SCREENING     Status:  Normal   Collection Time   06/23/12  9:12 AM      Component Value Range Status Comment   MRSA by PCR NEGATIVE  NEGATIVE Final     Medical History: Past Medical History  Diagnosis Date  . Ulcerative colitis, unspecified   . Metabolic encephalopathy   . Unspecified intellectual disabilities   . Obesity, unspecified   . Hyperpotassemia   . Other convulsions   . Other lymphedema   . Unspecified constipation   . Other physical therapy   . Muscle weakness (generalized)   . Nephrolithiasis 01/12/2012  . Hydronephrosis 01/12/2012  . Healthcare-associated pneumonia 01/12/2012  . Intellectual disability     Medications:  Scheduled:     . albuterol  2.5 mg Nebulization Q6H  . antiseptic oral rinse  15 mL Mouth Rinse q12n4p  . ceFEPime (MAXIPIME) IV  1 g Intravenous Q8H  . chlorhexidine  15 mL Mouth Rinse BID  . enoxaparin (LOVENOX) injection  40 mg Subcutaneous Q24H  . fluconazole (DIFLUCAN) IV  200 mg Intravenous Q24H  . levofloxacin (LEVAQUIN) IV  750 mg Intravenous Q24H  . PHENObarbital  130 mg Intravenous QHS  . phenytoin (DILANTIN) IV  100 mg Intravenous Q8H  . potassium chloride  10 mEq Intravenous Q1 Hr x 5  . sodium chloride  1,000 mL Intravenous Once  . sodium chloride  10-40 mL Intracatheter Q12H  . vancomycin  1,000 mg Intravenous Once  . vancomycin  1,000 mg Intravenous Q8H  . DISCONTD: furosemide  20 mg Intravenous Once  . DISCONTD: PHENobarbital  64.8 mg Oral QHS  . DISCONTD: PHENobarbital  97.2 mg Oral QHS   Infusions:     . sodium chloride 0.45 % with kcl 75 mL/hr at 06/24/12 1130  . DISCONTD: 0.9 % NaCl with KCl 40 mEq / L Stopped (06/24/12 0905)   PRN:  Assessment: 56 yo M from SNF with sepsis 2/2 ?HCAP vs UTI vs recent bladder instrumentation, now on empiric vancomycin/cefepime/levaquin and fluconazole.    SCr has doubled in last 24 hours from 0.58-->1.06; I/O +1037 ml.  UOP 1.1 ml/kg/hr.    WBC trending down, now WNL  7/31 Blood cultures:  NGTD; 7/31  Urine cultures: 55K yeast  Tm24hr:  101.4  Hgb fell 14-->10.5  Vancomycin trough supratherapeutic @ 31.7   Goal of Therapy:  Vancomycin trough level 15-20 mcg/ml  Plan:  1.  Hold vancomycin.   2.  Check random vancomycin level with AM labs and adjust dose accordingly 3.  F/u renal fxn, cultures, and clinical course   Wynonia Hazard PharmD 6:53 PM 06/24/2012

## 2012-06-25 ENCOUNTER — Encounter (HOSPITAL_COMMUNITY): Payer: Self-pay | Admitting: Internal Medicine

## 2012-06-25 DIAGNOSIS — D509 Iron deficiency anemia, unspecified: Secondary | ICD-10-CM

## 2012-06-25 DIAGNOSIS — B49 Unspecified mycosis: Secondary | ICD-10-CM

## 2012-06-25 DIAGNOSIS — A419 Sepsis, unspecified organism: Secondary | ICD-10-CM

## 2012-06-25 HISTORY — DX: Unspecified mycosis: B49

## 2012-06-25 LAB — CBC WITH DIFFERENTIAL/PLATELET
Basophils Relative: 0 % (ref 0–1)
Eosinophils Absolute: 0.1 10*3/uL (ref 0.0–0.7)
Hemoglobin: 11.3 g/dL — ABNORMAL LOW (ref 13.0–17.0)
MCH: 30.8 pg (ref 26.0–34.0)
MCHC: 32.2 g/dL (ref 30.0–36.0)
Monocytes Absolute: 0.9 10*3/uL (ref 0.1–1.0)
Monocytes Relative: 12 % (ref 3–12)
Neutrophils Relative %: 75 % (ref 43–77)
RDW: 16.9 % — ABNORMAL HIGH (ref 11.5–15.5)

## 2012-06-25 LAB — IRON AND TIBC: Iron: <10 ug/dL — ABNORMAL LOW (ref 42–135)

## 2012-06-25 LAB — BASIC METABOLIC PANEL
BUN: 10 mg/dL (ref 6–23)
Creatinine, Ser: 0.9 mg/dL (ref 0.50–1.35)
GFR calc Af Amer: >90 mL/min (ref >90)
GFR calc non Af Amer: >90 mL/min (ref >90)
Potassium: 3.8 mEq/L (ref 3.5–5.1)

## 2012-06-25 LAB — FERRITIN: Ferritin: 528 ng/mL — ABNORMAL HIGH (ref 22–322)

## 2012-06-25 MED ORDER — FLUCONAZOLE IN SODIUM CHLORIDE 200-0.9 MG/100ML-% IV SOLN: 200.0000 mg | INTRAVENOUS | Status: DC

## 2012-06-25 MED ORDER — DEXTROSE 5 % IV SOLN
2.0000 g | INTRAVENOUS | Status: DC
  Administered 2012-06-25 – 2012-06-28 (×4): 2 g via INTRAVENOUS
  Filled 2012-06-25 (×4): qty 2

## 2012-06-25 MED ORDER — SODIUM CHLORIDE 0.9 % IV SOLN
100.0000 mg | Freq: Every day | INTRAVENOUS | Status: DC
  Administered 2012-06-25 – 2012-06-29 (×5): 100 mg via INTRAVENOUS
  Filled 2012-06-25 (×6): qty 100

## 2012-06-25 MED ORDER — FLUCONAZOLE IN SODIUM CHLORIDE 400-0.9 MG/200ML-% IV SOLN
400.0000 mg | INTRAVENOUS | Status: DC
  Filled 2012-06-25: qty 200

## 2012-06-25 MED ORDER — ENSURE COMPLETE PO LIQD: 237.0000 mL | Freq: Every day | ORAL | Status: DC | PRN

## 2012-06-25 NOTE — Consult Note (Addendum)
Infectious Diseases Initial Consultation  Reason for Consultation:  Yeast in blood   HPI: Charles Humphrey is a 56 y.o. male with MR, NH resident, recent right ureteroscopy and lithotripsy of pelvic stone presented to [facility] 7/31 with fever 1 day after procedure.  There was concern for pneumonia, pyelonephritis with ua significant for WBC, many bacteria.  Had catheter in place.  He was on cipro chonically (?suppressive therapy).  Noted to be hypoxic, though no hypoxia documented here that I can find.  Febrile to 102 and tachycardic, concern for SIRS and admitted to stepdown.     At baseline, he is non-verbal, bedbound.    Past Medical History  Diagnosis Date  . Ulcerative colitis, unspecified   . Metabolic encephalopathy   . Unspecified intellectual disabilities   . Obesity, unspecified   . Hyperpotassemia   . Other convulsions   . Other lymphedema   . Unspecified constipation   . Other physical therapy   . Muscle weakness (generalized)   . Nephrolithiasis 01/12/2012  . Hydronephrosis 01/12/2012  . Healthcare-associated pneumonia 01/12/2012  . Intellectual disability     Allergies:  Allergies  Allergen Reactions  . Other Other (See Comments)    No dairy products    Current antibiotics:   MEDICATIONS:    . albuterol  2.5 mg Nebulization Q6H  . antiseptic oral rinse  15 mL Mouth Rinse q12n4p  . chlorhexidine  15 mL Mouth Rinse BID  . enoxaparin (LOVENOX) injection  40 mg Subcutaneous Q24H  . levofloxacin (LEVAQUIN) IV  750 mg Intravenous Q24H  . micafungin (MYCAMINE) IV  100 mg Intravenous Daily  . PHENObarbital  130 mg Intravenous QHS  . phenytoin (DILANTIN) IV  100 mg Intravenous Q8H  . potassium chloride  10 mEq Intravenous Q1 Hr x 5  . sodium chloride  10-40 mL Intracatheter Q12H  . DISCONTD: ceFEPime (MAXIPIME) IV  1 g Intravenous Q8H  . DISCONTD: fluconazole (DIFLUCAN) IV  200 mg Intravenous Q24H  . DISCONTD: fluconazole (DIFLUCAN) IV  200 mg Intravenous Q24H  .  DISCONTD: fluconazole (DIFLUCAN) IV  400 mg Intravenous Q24H  . DISCONTD: furosemide  20 mg Intravenous Once  . DISCONTD: vancomycin  1,000 mg Intravenous Once  . DISCONTD: vancomycin  1,000 mg Intravenous Q8H    History  Substance Use Topics  . Smoking status: Never Smoker   . Smokeless tobacco: Never Used  . Alcohol Use: No    History reviewed. No pertinent family history.  Review of Systems - Negative except as per HPI.   OBJECTIVE: Temp:  [98.2 F (36.8 C)-99.7 F (37.6 C)] 98.2 F (36.8 C) (08/02 0800) Pulse Rate:  [38-113] 38  (08/02 0700) Resp:  [18-28] 18  (08/02 0300) BP: (98-136)/(55-83) 136/76 mmHg (08/02 0700) SpO2:  [92 %-99 %] 94 % (08/02 0817) Weight:  [181 lb 14.1 oz (82.5 kg)] 181 lb 14.1 oz (82.5 kg) (08/02 0500) General appearance: no distress Resp: clear to auscultation bilaterally Cardio: regular rate and rhythm, S1, S2 normal, no murmur, click, rub or gallop GI: soft, non-tender; bowel sounds normal; no masses,  no organomegaly  LABS: Results for orders placed during the hospital encounter of 06/22/12 (from the past 48 hour(s))  PHENYTOIN LEVEL, TOTAL     Status: Normal   Collection Time   06/23/12 10:39 AM      Component Value Range Comment   Phenytoin Lvl 13.4  10.0 - 20.0 ug/mL   COMPREHENSIVE METABOLIC PANEL     Status: Abnormal  Collection Time   06/24/12  3:55 AM      Component Value Range Comment   Sodium 146 (*) 135 - 145 mEq/L REPEATED TO VERIFY   Potassium 3.4 (*) 3.5 - 5.1 mEq/L REPEATED TO VERIFY   Chloride 115 (*) 96 - 112 mEq/L REPEATED TO VERIFY   CO2 24  19 - 32 mEq/L REPEATED TO VERIFY   Glucose, Bld 155 (*) 70 - 99 mg/dL REPEATED TO VERIFY   BUN 12  6 - 23 mg/dL REPEATED TO VERIFY   Creatinine, Ser 1.06  0.50 - 1.35 mg/dL    Calcium 7.6 (*) 8.4 - 10.5 mg/dL REPEATED TO VERIFY   Total Protein 5.4 (*) 6.0 - 8.3 g/dL REPEATED TO VERIFY   Albumin 1.8 (*) 3.5 - 5.2 g/dL REPEATED TO VERIFY   AST 32  0 - 37 U/L REPEATED TO VERIFY    ALT 20  0 - 53 U/L REPEATED TO VERIFY   Alkaline Phosphatase 101  39 - 117 U/L REPEATED TO VERIFY   Total Bilirubin 0.2 (*) 0.3 - 1.2 mg/dL REPEATED TO VERIFY   GFR calc non Af Amer 78 (*) >90 mL/min    GFR calc Af Amer >90  >90 mL/min   CBC WITH DIFFERENTIAL     Status: Abnormal   Collection Time   06/24/12  3:55 AM      Component Value Range Comment   WBC 7.3  4.0 - 10.5 K/uL    RBC 3.43 (*) 4.22 - 5.81 MIL/uL    Hemoglobin 10.5 (*) 13.0 - 17.0 g/dL    HCT 82.9 (*) 56.2 - 52.0 %    MCV 96.8  78.0 - 100.0 fL    MCH 30.6  26.0 - 34.0 pg    MCHC 31.6  30.0 - 36.0 g/dL    RDW 13.0 (*) 86.5 - 15.5 %    Platelets 218  150 - 400 K/uL    Neutrophils Relative 88 (*) 43 - 77 %    Neutro Abs 6.5  1.7 - 7.7 K/uL    Lymphocytes Relative 5 (*) 12 - 46 %    Lymphs Abs 0.4 (*) 0.7 - 4.0 K/uL    Monocytes Relative 7  3 - 12 %    Monocytes Absolute 0.5  0.1 - 1.0 K/uL    Eosinophils Relative 0  0 - 5 %    Eosinophils Absolute 0.0  0.0 - 0.7 K/uL    Basophils Relative 0  0 - 1 %    Basophils Absolute 0.0  0.0 - 0.1 K/uL   MAGNESIUM     Status: Normal   Collection Time   06/24/12  9:16 AM      Component Value Range Comment   Magnesium 1.8  1.5 - 2.5 mg/dL   VITAMIN H84     Status: Normal   Collection Time   06/24/12  9:16 AM      Component Value Range Comment   Vitamin B-12 229  211 - 911 pg/mL   FOLATE     Status: Normal   Collection Time   06/24/12  9:16 AM      Component Value Range Comment   Folate >20.0     IRON AND TIBC     Status: Abnormal   Collection Time   06/24/12  9:16 AM      Component Value Range Comment   Iron <10 (*) 42 - 135 ug/dL    TIBC Not calculated due to Iron <10.  215 - 435 ug/dL    Saturation Ratios Not calculated due to Iron <10.  20 - 55 %    UIBC 135  125 - 400 ug/dL   FERRITIN     Status: Abnormal   Collection Time   06/24/12  9:16 AM      Component Value Range Comment   Ferritin 528 (*) 22 - 322 ng/mL   VANCOMYCIN, TROUGH     Status: Abnormal   Collection Time    06/24/12  5:26 PM      Component Value Range Comment   Vancomycin Tr 31.7 (*) 10.0 - 20.0 ug/mL   CBC WITH DIFFERENTIAL     Status: Abnormal   Collection Time   06/25/12  4:20 AM      Component Value Range Comment   WBC 7.4  4.0 - 10.5 K/uL    RBC 3.67 (*) 4.22 - 5.81 MIL/uL    Hemoglobin 11.3 (*) 13.0 - 17.0 g/dL    HCT 16.1 (*) 09.6 - 52.0 %    MCV 95.6  78.0 - 100.0 fL    MCH 30.8  26.0 - 34.0 pg    MCHC 32.2  30.0 - 36.0 g/dL    RDW 04.5 (*) 40.9 - 15.5 %    Platelets 228  150 - 400 K/uL    Neutrophils Relative 75  43 - 77 %    Neutro Abs 5.5  1.7 - 7.7 K/uL    Lymphocytes Relative 12  12 - 46 %    Lymphs Abs 0.9  0.7 - 4.0 K/uL    Monocytes Relative 12  3 - 12 %    Monocytes Absolute 0.9  0.1 - 1.0 K/uL    Eosinophils Relative 1  0 - 5 %    Eosinophils Absolute 0.1  0.0 - 0.7 K/uL    Basophils Relative 0  0 - 1 %    Basophils Absolute 0.0  0.0 - 0.1 K/uL   BASIC METABOLIC PANEL     Status: Abnormal   Collection Time   06/25/12  4:20 AM      Component Value Range Comment   Sodium 144  135 - 145 mEq/L    Potassium 3.8  3.5 - 5.1 mEq/L    Chloride 113 (*) 96 - 112 mEq/L    CO2 25  19 - 32 mEq/L    Glucose, Bld 133 (*) 70 - 99 mg/dL    BUN 10  6 - 23 mg/dL    Creatinine, Ser 8.11  0.50 - 1.35 mg/dL    Calcium 8.3 (*) 8.4 - 10.5 mg/dL    GFR calc non Af Amer >90  >90 mL/min    GFR calc Af Amer >90  >90 mL/min     MICRO:  IMAGING: Dg Chest Port 1 View  06/24/2012  *RADIOLOGY REPORT*  Clinical Data: Evaluate bibasilar opacities  PORTABLE CHEST - 1 VIEW  Comparison: 06/23/2012; 06/16/2012  Findings: Grossly unchanged enlarged cardiac silhouette and mediastinal contours.  Interval placement of a right upper extremity approach PICC line with tip overlying the superior cavoatrial junction.  Lung volumes remain persistently reduced. Pulmonary vasculature is indistinct with cephalization of flow. Minimal increase in bilateral infrahilar heterogeneous opacities. No definite pleural  effusion or pneumothorax.  Unchanged bones.  IMPRESSION: 1.  Right upper extremity approach PICC line tip overlies the superior cavoatrial junction. 2. Minimal increase in bilateral infrahilar heterogeneous opacities, atelectasis versus infiltrate.  Further evaluation with a PA and lateral chest radiograph may  be obtained as clinically indicated. 3.  Suspect mild pulmonary edema.  Original Report Authenticated By: Waynard Reeds, M.D.    HISTORICAL MICRO/IMAGING  Assessment/Plan:  56 year old with fungemia.  1) Fungemia - will await speciation.  Started on micafungin and should continue.  Will await ID of organism.  If Candida albicans, he could take po fluconazole, o/w he may need IV.  PICC line has to be removed and only use peripheral access until negative blood culture for 72 hours.   -it will likely be 3-4 days to get speciation of organism.   -he will need an outpatient eye exam -I will check a TTE as well for endocarditis.    2) Peylonephritis - I will change to ceftriaxone with with recent cipro use.

## 2012-06-25 NOTE — Clinical Documentation Improvement (Signed)
MALNUTRITION DOCUMENTATION CLARIFICATION  THIS DOCUMENT IS NOT A PERMANENT PART OF THE MEDICAL RECORD  TO RESPOND TO THE THIS QUERY, FOLLOW THE INSTRUCTIONS BELOW:  1. If needed, update documentation for the patient's encounter via the notes activity.  2. Access this query again and click edit on the In [employer].  3. After updating, or not, click F2 to complete all highlighted (required) fields concerning your review. Select "additional documentation in the medical record" OR "no additional documentation provided".  4. Click Sign note button.  5. The deficiency will fall out of your In Basket *Please let us know if you are not able to complete this workflow by phone or e-mail (listed below).  Please update your documentation within the medical record to reflect your response to this query.                                                                                        06/25/12   Dear Dr[facility],  In a better effort to capture your patient's severity of illness, reflect appropriate length of stay and utilization of resources, a review of the patient medical record has revealed the following indicators.    Based on your clinical judgment, please clarify and document in a progress note and/or discharge summary the clinical condition associated with the following supporting information:  In responding to this query please exercise your independent judgment.  The fact that a query is asked, does not imply that any particular answer is desired or expected.  06/24/12 Nutrition Assessment w/ documentation criteria for "-Severe malnutrition in the context of acute illness or injury ". For accurate Dx specificity & severity please help validate/clarify nutr documentation for cond being eval'd, mon'd & tx'd. Thank you  Possible Clinical Conditions? . Mild Malnutrition  . Moderate Malnutrition  . Severe Malnutrition  . Protein Calorie Malnutrition . Severe Protein  Calorie Malnutrition  . Emaciation  . Cachexia   Other Condition (please specify) Cannot Clinically Determine  Supporting Information: Risk Factors: See Nutrition Assessment 06/24/12  Signs & Symptoms: See Nutrition Assessment 06/24/12  -Diagnostics: See Nutrition Assessment 06/24/12  -Treatments: See Nutrition Assessment 06/24/12  -Nutrition Consult: See Nutrition Assessment 06/24/12    You may use possible, probable, or suspect with inpatient documentation. possible, probable, suspected diagnoses MUST be documented at the time of discharge  Reviewed:  no additional documentation provided: 06/29/12>progress notes rev'd>query closed.orm  Thank You,  Toribio Harbour, RN, BSN, CCDS Certified Clinical Documentation Specialist Pager: [phone]  Health Information Management [place]

## 2012-06-25 NOTE — Progress Notes (Signed)
Subjective: Patient is stable with no agitation or indication of pain.  His WBC remains down and he has been afeb.  Urine culture only grew 50K yeast but his blood culture is negative to date.  Objective: Vital signs in last 24 hours: Temp:  [98.7 F (37.1 C)-99.7 F (37.6 C)] 99.5 F (37.5 C) (08/02 0000) Pulse Rate:  [100-115] 108  (08/02 0300) Resp:  [18-28] 18  (08/02 0300) BP: (98-133)/(55-83) 119/80 mmHg (08/02 0300) SpO2:  [92 %-99 %] 95 % (08/02 0300) Weight:  [82.5 kg (181 lb 14.1 oz)] 82.5 kg (181 lb 14.1 oz) (08/02 0500)  Intake/Output from previous day: 08/01 0701 - 08/02 0700 In: 3049.9 [I.V.:1897.9; IV Piggyback:1152] Out: 4675 [Urine:4675] Intake/Output this shift:     Lab Results:  Basename 06/25/12 0420 06/24/12 0355 06/23/12 0014  HGB 11.3* 10.5* 14.0  HCT 35.1* 33.2* 42.5   BMET  Basename 06/25/12 0420 06/24/12 0355  NA 144 146*  K 3.8 3.4*  CL 113* 115*  CO2 25 24  GLUCOSE 133* 155*  BUN 10 12  CREATININE 0.90 1.06  CALCIUM 8.3* 7.6*   No results found for this basename: LABPT:3,INR:3 in the last 72 hours No results found for this basename: LABURIN:1 in the last 72 hours Results for orders placed during the hospital encounter of 06/22/12  CULTURE, BLOOD (ROUTINE X 2)     Status: Normal (Preliminary result)   Collection Time   06/23/12 12:13 AM      Component Value Range Status Comment   Specimen Description BLOOD RIGHT HAND   Final    Special Requests BOTTLES DRAWN AEROBIC ONLY   Final    Culture  Setup Time 06/23/2012 03:52   Final    Culture     Final    Value: YEAST     Note: Gram Stain Report Called to,Read Back By and Verified With: VELINA PERITIN @ 0045 ON 06/25/12 BY GOLLD   Report Status PENDING   Incomplete   CULTURE, BLOOD (ROUTINE X 2)     Status: Normal (Preliminary result)   Collection Time   06/23/12  1:10 AM      Component Value Range Status Comment   Specimen Description BLOOD RIGHT   Final    Special Requests BOTTLES  DRAWN AEROBIC ONLY 3CC   Final    Culture  Setup Time 06/23/2012 03:52   Final    Culture     Final    Value:        BLOOD CULTURE RECEIVED NO GROWTH TO DATE CULTURE WILL BE HELD FOR 5 DAYS BEFORE ISSUING A FINAL NEGATIVE REPORT   Report Status PENDING   Incomplete   URINE CULTURE     Status: Normal   Collection Time   06/23/12  4:26 AM      Component Value Range Status Comment   Specimen Description URINE, RANDOM   Final    Special Requests NONE   Final    Culture  Setup Time 06/23/2012 09:03   Final    Colony Count 55,000 COLONIES/ML   Final    Culture YEAST   Final    Report Status 06/24/2012 FINAL   Final   MRSA PCR SCREENING     Status: Normal   Collection Time   06/23/12  9:12 AM      Component Value Range Status Comment   MRSA by PCR NEGATIVE  NEGATIVE Final     Studies/Results: Dg Chest Port 1 View  06/24/2012  *RADIOLOGY  REPORT*  Clinical Data: Evaluate bibasilar opacities  PORTABLE CHEST - 1 VIEW  Comparison: 06/23/2012; 06/16/2012  Findings: Grossly unchanged enlarged cardiac silhouette and mediastinal contours.  Interval placement of a right upper extremity approach PICC line with tip overlying the superior cavoatrial junction.  Lung volumes remain persistently reduced. Pulmonary vasculature is indistinct with cephalization of flow. Minimal increase in bilateral infrahilar heterogeneous opacities. No definite pleural effusion or pneumothorax.  Unchanged bones.  IMPRESSION: 1.  Right upper extremity approach PICC line tip overlies the superior cavoatrial junction. 2. Minimal increase in bilateral infrahilar heterogeneous opacities, atelectasis versus infiltrate.  Further evaluation with a PA and lateral chest radiograph may be obtained as clinically indicated. 3.  Suspect mild pulmonary edema.  Original Report Authenticated By: Waynard Reeds, M.D.    Assessment/Plan: I have removed his stent but left the foley for now.    The foley can be removed when not needed for medical  therapy. With response to antibiotic therapy, I am not sure antifungal therapy is indicated.     LOS: 3 days   Toini Failla J 06/25/2012, 7:14 AM

## 2012-06-25 NOTE — Progress Notes (Signed)
55yom from NH with HCAP/UTI on vanco, levaquin, cefepime. + BC for yeast. Mid-level text paged. VSS, ST on monitor. Minimally responsive (baseline), foley with clear yellow urine good output.

## 2012-06-25 NOTE — Progress Notes (Signed)
Speech Language Pathology Dysphagia Treatment Patient Details Name: Charles Humphrey MRN: [id number] DOB: Apr 20, 1956 Today's Date: 06/25/2012 Time: 1308-6578 SLP Time Calculation (min): 12 min  Assessment / Plan / Recommendation Clinical Impression  Pt continuing to require total assist to drink, dad holding head and placing straw in mouth when pt opens.  Dad reports pt consumed 12 ounces of liquids without difficulties.  Pt would not accept icecream or oatmeal creme pie - turned head and sealed lips.  When solid placed in mouth - pt with lingual thrusting and expectoration - did not attempt to masticate.   For maximal effiiciency and to decr asp risk, recommend to continue thin full liquid diet (no solids) - only giving pt intake when he accepts.  Hopeful for more acceptance of po wtih medical improvement-RN reports PICC has + yeast so will have to removed.    Father has been feeding pt for years and uses his own strategies.  Aspiration risk is chronic but diet and compensations in place will mitigate risks as much as able.      Diet Recommendation  Initiate / Change Diet: Other (comment) (full liquid)    SLP Plan Continue with current plan of care   Pertinent Vitals/Pain Afebrile, decreased, pt now on room air   Swallowing Goals  SLP Swallowing Goals Swallow Study Goal #2 - Progress: Progressing toward goal  General Temperature Spikes Noted: Yes (low grade continues) Respiratory Status: Room air Behavior/Cognition: Alert;Doesn't follow directions;Other (comment) (pt with cognitive deficits) Oral Cavity - Dentition: Adequate natural dentition Patient Positioning: Upright in bed  Oral Cavity - Oral Hygiene     Dysphagia Treatment Treatment focused on: Skilled observation of diet tolerance Family/Caregiver Educated: father and significant other Treatment Methods/Modalities: Skilled observation Patient observed directly with PO's: Yes Type of PO's observed: Thin  liquids;Dysphagia 3 (soft) Feeding: Total assist Liquids provided via: Straw Oral Phase Signs & Symptoms: Other (comment) (lingual thrust-pt did not masticate solids, lingual thrust) Amount of cueing: Total (pt does not follow commands)   GO    [name], MS [facility] SLP [phone]

## 2012-06-25 NOTE — Progress Notes (Signed)
[facility] PROGRESS NOTE  Charles Humphrey VWU:[id number] DOB: [birth year] DOA: 06/22/2012 PCP: Kaleen Mask, MD  Assessment/Plan: Principal Problem:  *Sepsis Active Problems:  Healthcare-associated pneumonia  Epilepsy  Mental retardation  Nephrolithiasis  Hydronephrosis  Leukocytosis  Hypokalemia  UTI (lower urinary tract infection)  Iron deficiency anemia  Fungemia  #1 sepsis Likely secondary to fungemia vs bacteremia from recent bladder instrumentation versus secondary to urinary tract infection versus questionable healthcare associated pneumonia. Patient's tachycardia is improving. Patient's white count is trending down. Patient with no fever overnight. Patient's temperature was as high as 102.7 2 days ago. Respiratory rate is improving. Blood cultures are positive for yeast one out of two. Urine cultures also growing yeast.. Sputum cultures pending. Will D/C IV vancomycin and cefipime. Continue Levaquin. Will start patient on IV micafungin while speciation of the is pending. Will need to discontinue PICC line and place peripheral IVs. Patient will likely need at least 3 weeks of treatment. Once infection is cleared may replace PICC line if needed. Will consult with ID for further evaluation and management. Monitor.  #2 Fungemia Blood cultures one out of 2 with yeast. Urine was also yeast. Patient will be started on IV micafungin and will need at least 3 weeks of therapy. Will need to remove the PICC line. We'll need to try to get some peripheral IVs. Once infection has been cleared may replace PICC line if needed. Once PICC line has been removed will likely need to reculture the blood. We'll consult with infectious disease for further evaluation and management.  #3 urinary tract infection Urine cultures are growing yeast 55,000 colonies. Patient also with yeast in his blood stream. Patient has been started on IV micafungin.Continue empiric IV Levaquin.  #4  questionable healthcare associated pneumonia On admission and per family patient did have a productive cough and some congestion and also question of possible aspiration pneumonia. Doubt if patient has a pneumonia. Patient has been seen by speech therapy and does not show any signs or symptoms of aspiration when being observed being fed. Patient however does have increased risk of aspiration. Was recommended to start patient on a full liquid diet. Sputum cultures pending. Will continue Levaquin. D/C IV vancomycin and cefepime.  #5 status post right renal pelvic stone laser lithotripsy Dr. Annabell Howells of urology is following and stent has been removed however Foley catheter will remain for now per urology. Will DC IV vancomycin. Patient's blood cultures is growing yeast. Will add IV micafungin to his regimen. Continue IV Levaquin and cefepime for now. Urology is following and appreciate input and recommendations.  #6 hypokalemia Repleted.  #7 severe iron deficiency anemia H&H is stable. Per urology minimal blood loss. Patient does not have any gross GI bleed. Anemia panel consistent with severe iron deficiency anemia with iron level less than 10. Patient may benefit from IV and later stayed. May also benefit from iron supplements on discharge. Follow H&H.  #8 history of seizure/epilepsy No seizures noted. Phenytoin level was 13.4. Corrected phenytoin level is 20.3. Continue IV phenytoin per pharmacy. Once patient is able to tolerate oral intake will change IV phenytoin 2 liquid phenytoin.  #9 mental retardation Stable.  #10 prophylaxis Lovenox for DVT prophylaxis.   Code Status: DO NOT RESUSCITATE Family Communication: No family at bedside. Patient is nonverbal. Disposition Plan: There continues to remain stable may consider transfer to telemetry tomorrow. Back to nursing home when medically stable   Brief narrative: Charles Humphrey is a 56 yo wm, [facility] resident, admitted  via [facility] for  fever, pneumonia, and possible urosepsis, 1 day following R ureteroscopy and laser fragmentation of R renal pelvic stone, with placement of JJ stent and foley catheter. Stent and catheter ate to be left for 48 hrs, to be removed together in the outpatient setting.  The patient underwent Left percutaneous nephrolithotomy for multiple large renal stones in March, 2013 ( aggregate > 2cm). At the time he was noted to have an asymptomatic 6mm R renal pelvic stone. However, he developed pain ( father's interpretation), and repeat evaluation showed stone size increased to 7mm, with R hydronephrosis at the [facility]. He failed conservative therapy, and underwent R ureteroscopy and laser lithotripsy per Dr. Annabell Howells on 06/22/12. JJ stent left in place, taped to a foley catheter. However , pt developed a fever, and lung congestion last night and was brought to [facility] by [facility] for care. He is admitted to [facility].    Consultants:  Urology: Dr. Patsi Sears 06/23/2012 being followed by Dr. Annabell Howells  Procedures:  Chest x-ray 06/23/2012  Chest x-ray 06/24/2012  Antibiotics:  IV Maxipime 06/23/2012--->06/25/12  IV Levaquin 06/23/2012  IV vancomycin 06/23/2012--> 06/25/2012  IV micafungin 06/25/2012  HPI/Subjective: Patient opens eyes to verbal stimuli however is nonverbal. More alert today than yesterday. Per nursing patient with some trashing around last night.  Objective: Filed Vitals:   06/25/12 0500 06/25/12 0700 06/25/12 0800 06/25/12 0817  BP:  136/76    Pulse:  38    Temp:   98.2 F (36.8 C)   TempSrc:   Axillary   Resp:      Height:      Weight: 82.5 kg (181 lb 14.1 oz)     SpO2:  92%  94%    Intake/Output Summary (Last 24 hours) at 06/25/12 1018 Last data filed at 06/25/12 0700  Gross per 24 hour  Intake 2164.5 ml  Output   4075 ml  Net -1910.5 ml    Exam: General: Patient opens eyes to verbal stimuli however is nonverbal and a little more alert  today. HEENT: No bruits, no goiter. Heart: Tachycardia, without murmurs, rubs, gallops. Lungs: Clear to auscultation bilaterally Abdomen: Soft, nontender, nondistended, positive bowel sounds. Extremities: No clubbing cyanosis or edema with positive pedal pulses. Neuro: Grossly intact, nonfocal.    Data Reviewed: Basic Metabolic Panel:  Lab 06/25/12 4098 06/24/12 0916 06/24/12 0355 06/23/12 0014 06/23/12 0013  NA 144 -- 146* 144 --  K 3.8 -- 3.4* 3.3* --  CL 113* -- 115* 108 --  CO2 25 -- 24 27 --  GLUCOSE 133* -- 155* 149* --  BUN 10 -- 12 7 --  CREATININE 0.90 -- 1.06 0.58 0.60  CALCIUM 8.3* -- 7.6* 8.5 --  MG -- 1.8 -- -- 1.8  PHOS -- -- -- -- --   Liver Function Tests:  Lab 06/24/12 0355 06/23/12 0013  AST 32 19  ALT 20 11  ALKPHOS 101 133*  BILITOT 0.2* 0.2*  PROT 5.4* 7.1  ALBUMIN 1.8* 2.8*   No results found for this basename: LIPASE:5,AMYLASE:5 in the last 168 hours No results found for this basename: AMMONIA:5 in the last 168 hours CBC:  Lab 06/25/12 0420 06/24/12 0355 06/23/12 0014 06/23/12 0013  WBC 7.4 7.3 14.6* 14.0*  NEUTROABS 5.5 6.5 -- --  HGB 11.3* 10.5* 14.0 14.6  HCT 35.1* 33.2* 42.5 45.5  MCV 95.6 96.8 96.2 98.9  PLT 228 218 244 248   Cardiac Enzymes:  Lab 06/23/12 0014  CKTOTAL --  CKMB --  CKMBINDEX --  TROPONINI <0.30   BNP (last 3 results) No results found for this basename: PROBNP:3 in the last 8760 hours CBG: No results found for this basename: GLUCAP:5 in the last 168 hours  Recent Results (from the past 240 hour(s))  CULTURE, BLOOD (ROUTINE X 2)     Status: Normal   Collection Time   06/16/12  7:55 PM      Component Value Range Status Comment   Specimen Description BLOOD RIGHT HAND   Final    Special Requests BOTTLES DRAWN AEROBIC ONLY 3CC   Final    Culture  Setup Time 06/17/2012 03:32   Final    Culture NO GROWTH 5 DAYS   Final    Report Status 06/23/2012 FINAL   Final   CULTURE, BLOOD (ROUTINE X 2)     Status: Normal    Collection Time   06/16/12  8:00 PM      Component Value Range Status Comment   Specimen Description BLOOD LEFT HAND   Final    Special Requests BOTTLES DRAWN AEROBIC ONLY 3CC   Final    Culture  Setup Time 06/17/2012 03:32   Final    Culture NO GROWTH 5 DAYS   Final    Report Status 06/23/2012 FINAL   Final   URINE CULTURE     Status: Normal   Collection Time   06/16/12  8:08 PM      Component Value Range Status Comment   Specimen Description URINE, CATHETERIZED   Final    Special Requests NONE   Final    Culture  Setup Time 06/17/2012 03:24   Final    Colony Count NO GROWTH   Final    Culture NO GROWTH   Final    Report Status 06/17/2012 FINAL   Final   MRSA PCR SCREENING     Status: Normal   Collection Time   06/17/12 10:30 AM      Component Value Range Status Comment   MRSA by PCR NEGATIVE  NEGATIVE Final   CULTURE, BLOOD (ROUTINE X 2)     Status: Normal (Preliminary result)   Collection Time   06/23/12 12:13 AM      Component Value Range Status Comment   Specimen Description BLOOD RIGHT HAND   Final    Special Requests BOTTLES DRAWN AEROBIC ONLY   Final    Culture  Setup Time 06/23/2012 03:52   Final    Culture     Final    Value: YEAST     Note: Gram Stain Report Called to,Read Back By and Verified With: VELINA PERITIN @ 0045 ON 06/25/12 BY GOLLD   Report Status PENDING   Incomplete   CULTURE, BLOOD (ROUTINE X 2)     Status: Normal (Preliminary result)   Collection Time   06/23/12  1:10 AM      Component Value Range Status Comment   Specimen Description BLOOD RIGHT   Final    Special Requests BOTTLES DRAWN AEROBIC ONLY 3CC   Final    Culture  Setup Time 06/23/2012 03:52   Final    Culture     Final    Value:        BLOOD CULTURE RECEIVED NO GROWTH TO DATE CULTURE WILL BE HELD FOR 5 DAYS BEFORE ISSUING A FINAL NEGATIVE REPORT   Report Status PENDING   Incomplete   URINE CULTURE     Status: Normal   Collection Time  06/23/12  4:26 AM      Component Value Range  Status Comment   Specimen Description URINE, RANDOM   Final    Special Requests NONE   Final    Culture  Setup Time 06/23/2012 09:03   Final    Colony Count 55,000 COLONIES/ML   Final    Culture YEAST   Final    Report Status 06/24/2012 FINAL   Final   MRSA PCR SCREENING     Status: Normal   Collection Time   06/23/12  9:12 AM      Component Value Range Status Comment   MRSA by PCR NEGATIVE  NEGATIVE Final      Studies: Ct Abdomen Pelvis Wo Contrast  06/04/2012  *RADIOLOGY REPORT*  Clinical Data: History kidney stones.  Reevaluation  CT ABDOMEN AND PELVIS WITHOUT CONTRAST  Technique:  Multidetector CT imaging of the abdomen and pelvis was performed following the standard protocol without intravenous contrast.  Comparison: CT 05/21/2012  Findings:  Renal:  Non-IV contrast images demonstrate a partially obstructing calculus at the right ureteral pelvic junction measuring 9 mm not changed from prior.  There is mild hydronephrosis of the right which is similar to prior.  There is nonobstructing 5 mm calculus in the upper pole right kidney.  No distal right ureteral calculi. In the left kidney, there are three nonobstructing calculi ranging in size from 3 to  4 mm.  No obstructive uropathy on the left.  No left ureterolithiasis.  Lung bases are clear.  No pericardial fluid.  No focal hepatic lesion.  The gallbladder is collapsed.  The pancreas, spleen, adrenal gland are normal.  The stomach is gas filled similar to prior.  Small bowel and cecum are normal.  The rectum and sigmoid colon is smooth similar to prior with mild wall thickening.  Abdominal aorta normal caliber.  No retroperitoneal periportal lymphadenopathy.  No free fluid the pelvis.  The prostate gland is normal.  The bladder is mildly distended.  No evidence of bladder stones. Review of  bone windows demonstrates no aggressive osseous lesions.  IMPRESSION:  1.  Partially obstructing calculus at the right ureteral pelvic junction is not changed  from prior. 2.  Bilateral nephrolithiasis.  3.   Smooth contour to the sigmoid colon and proximal rectum could represent chronic inflammation.  There is some retention of stool in the rectum.  Original Report Authenticated By: Genevive Bi, M.D.   Dg Chest 2 View  06/23/2012  *RADIOLOGY REPORT*  Clinical Data: Tachycardia, fever, shortness of breath.  CHEST - 2 VIEW  Comparison: 06/16/2012  Findings: Hypoaeration with interstitial vascular crowding. Prominent cardiomediastinal contours, similar to prior.  Central vascular congestion.  Mild lung base opacities.  Otherwise, no focal consolidation.  No pleural effusion or pneumothorax. Osteopenia.  IMPRESSION: Hypoaeration and mild bibasilar opacities; atelectasis versus infiltrate.  Prominent cardiomediastinal contours are similar to prior.  Original Report Authenticated By: Waneta Martins, M.D.   Dg Chest 2 View  06/16/2012  *RADIOLOGY REPORT*  Clinical Data: Fever.  Short of breath.  CHEST - 2 VIEW  Comparison: 02/05/2012  Findings: Lungs are markedly under aerated with bibasilar atelectasis.  Heart is upper normal in size allowing for low volumes.  There is persistent marked distention of the transverse colon and probably associated small bowel loops as previously seen. No obvious free intraperitoneal gas.  IMPRESSION: Bibasilar atelectasis.  Stable colonic distention.  Original Report Authenticated By: [name], M.D.   Dg Abd 1 View  06/17/2012  *RADIOLOGY REPORT*  Clinical Data: Right ureteral calculus and fever.  ABDOMEN - 1 VIEW  Comparison: CT of the abdomen dated 06/04/2012  Findings: Single image was obtained intraoperatively with a C-arm and saved.  This shows the superior aspect of a ureteral stent which extends up into the expected position of the central collecting system.  IMPRESSION: Imaging during right ureteral stent placement.  Original Report Authenticated By: Reola Calkins, M.D.   Dg Chest Port 1 View  06/24/2012   *RADIOLOGY REPORT*  Clinical Data: Evaluate bibasilar opacities  PORTABLE CHEST - 1 VIEW  Comparison: 06/23/2012; 06/16/2012  Findings: Grossly unchanged enlarged cardiac silhouette and mediastinal contours.  Interval placement of a right upper extremity approach PICC line with tip overlying the superior cavoatrial junction.  Lung volumes remain persistently reduced. Pulmonary vasculature is indistinct with cephalization of flow. Minimal increase in bilateral infrahilar heterogeneous opacities. No definite pleural effusion or pneumothorax.  Unchanged bones.  IMPRESSION: 1.  Right upper extremity approach PICC line tip overlies the superior cavoatrial junction. 2. Minimal increase in bilateral infrahilar heterogeneous opacities, atelectasis versus infiltrate.  Further evaluation with a PA and lateral chest radiograph may be obtained as clinically indicated. 3.  Suspect mild pulmonary edema.  Original Report Authenticated By: Waynard Reeds, M.D.    Scheduled Meds:    . albuterol  2.5 mg Nebulization Q6H  . antiseptic oral rinse  15 mL Mouth Rinse q12n4p  . chlorhexidine  15 mL Mouth Rinse BID  . enoxaparin (LOVENOX) injection  40 mg Subcutaneous Q24H  . levofloxacin (LEVAQUIN) IV  750 mg Intravenous Q24H  . micafungin (MYCAMINE) IV  100 mg Intravenous Daily  . PHENObarbital  130 mg Intravenous QHS  . phenytoin (DILANTIN) IV  100 mg Intravenous Q8H  . potassium chloride  10 mEq Intravenous Q1 Hr x 5  . sodium chloride  10-40 mL Intracatheter Q12H  . DISCONTD: ceFEPime (MAXIPIME) IV  1 g Intravenous Q8H  . DISCONTD: fluconazole (DIFLUCAN) IV  200 mg Intravenous Q24H  . DISCONTD: fluconazole (DIFLUCAN) IV  200 mg Intravenous Q24H  . DISCONTD: fluconazole (DIFLUCAN) IV  400 mg Intravenous Q24H  . DISCONTD: furosemide  20 mg Intravenous Once  . DISCONTD: vancomycin  1,000 mg Intravenous Once  . DISCONTD: vancomycin  1,000 mg Intravenous Q8H   Continuous Infusions:    . sodium chloride 0.45 %  with kcl 75 mL/hr at 06/24/12 2206    Principal Problem:  *Sepsis Active Problems:  Healthcare-associated pneumonia  Epilepsy  Mental retardation  Nephrolithiasis  Hydronephrosis  Leukocytosis  Hypokalemia  UTI (lower urinary tract infection)  Iron deficiency anemia  Fungemia    Time spent: 45 mins    [facility]  [facility] Pager [phone]. If 8PM-8AM, please contact night-coverage at [email or web address], password [facility] 06/25/2012, 10:18 AM  LOS: 3 days

## 2012-06-25 NOTE — Clinical Documentation Improvement (Signed)
GENERIC DOCUMENTATION CLARIFICATION QUERY  THIS DOCUMENT IS NOT A PERMANENT PART OF THE MEDICAL RECORD  TO RESPOND TO THE THIS QUERY, FOLLOW THE INSTRUCTIONS BELOW:  1. If needed, update documentation for the patient's encounter via the notes activity.  2. Access this query again and click edit on the In [employer].  3. After updating, or not, click F2 to complete all highlighted (required) fields concerning your review. Select "additional documentation in the medical record" OR "no additional documentation provided".  4. Click Sign note button.  5. The deficiency will fall out of your In Basket *Please let us know if you are not able to complete this workflow by phone or e-mail (listed below).  Please update your documentation within the medical record to reflect your response to this query.                                                                                        06/25/12   Dear Dr.D [facility],  In a better effort to capture your patient's severity of illness, reflect appropriate length of stay and utilization of resources, a review of the patient medical record has revealed the following indicators.    Based on your clinical judgment, please clarify and document in a progress note and/or discharge summary the clinical condition associated with the following supporting information:  In responding to this query please exercise your independent judgment.  The fact that a query is asked, does not imply that any particular answer is desired or expected.  06/23/12 H&P..." 56 yo male with moderate to severe mental retardation, epilepsy who lives at snf and is bedbound and nonverbal at baseline". For accurate Dx speicificity & severity can noted cond be further specified w/ a cond being eval'd, mon'd & tx'd. Thank you   Possible Clinical Conditions? -Functional Quadraplegia  -Muscle weakness (generalized)  Other Condition (please specify)  Cannot Clinically  Determine  Supporting Information: Risk Factors: 06/23/12 H&P..." 56 yo male with moderate to severe mental retardation, epilepsy who lives at snf and is bedbound and nonverbal at baseline".   Signs & Symptoms: see above note  Diagnostics: 06/25/12: Trend routine labs & cult's  Treatment: 06/25/12 Nursing Orders: Oral Care every 2hrs; Maxislide indicated; Reposition every 2hrs; Nutrition assessment  Please acknowledge this query as requested below and document (any) supplemental information in the progress notes and/or your discharge summary.   Reviewed:  no additional documentation provided: 06/29/12>progress notes rev'd>query closed.orm  Thank You,  Toribio Harbour, RN, BSN, CCDS Certified Clinical Documentation Specialist Pager: [phone]  [facility] Information Management [place]

## 2012-06-25 NOTE — Clinical Documentation Improvement (Signed)
CHANGE MENTAL STATUS DOCUMENTATION CLARIFICATION   THIS DOCUMENT IS NOT A PERMANENT PART OF THE MEDICAL RECORD  TO RESPOND TO THE THIS QUERY, FOLLOW THE INSTRUCTIONS BELOW:  1. If needed, update documentation for the patient's encounter via the notes activity.  2. Access this query again and click edit on the In [employer].  3. After updating, or not, click F2 to complete all highlighted (required) fields concerning your review. Select "additional documentation in the medical record" OR "no additional documentation provided".  4. Click Sign note button.  5. The deficiency will fall out of your In Basket *Please let us know if you are not able to complete this workflow by phone or e-mail (listed below).         06/25/12  Dear Dr. [facility]  In an effort to better capture your patient's severity of illness, reflect appropriate length of stay and utilization of resources, a review of the patient medical record has revealed the following indicators.    Based on your clinical judgment, please clarify and document in a progress note and/or discharge summary the clinical condition associated with the following supporting information:  In responding to this query please exercise your independent judgment.  The fact that a query is asked, does not imply that any particular answer is desired or expected.  06/24/12 progr note.Marland KitchenMarland Kitchen"Patient opens eyes to verbal stimuli however is nonverbal. A little more alert today than yesterday.".Marland KitchenMarland KitchenFor accurate DX specificity & severity can noted findings and previous h/o of noted cond be further clarified/specified with a cond being eval'd, mon'd & tx'd. Thank you  Possible Clinical Conditions?  Encephalopathy (describe type if known) - Anoxic - Septic - Alcoholic  - Hepatic - Hypertensive - Metabolic -Toxic  Drug induced confusion/delirium Acute confusion Acute delirium  Acute exacerbation of known dementia (indicate type) Hyponatremia  / Hypernatremia  Poisoning / Overdose Hypoxemia / Hypoxia  Other Condition (please specify)    Supporting Information: Risk Factors: 06/23/12: H&P..."...bedbound and nonverbal at baseline"...per past med h/o "Metabolic encephalopathy" noted  Signs & Symptoms: 06/23/12 H&"..." Pt cannot provide any history due to his chronic mental retardation, he does awaken to voice. He was found to be hypoxic on arrival and placed on oxygen." 06/23/12 ED Nurse note.Marland KitchenMarland Kitchen"Pt will open eyes at sound of name or commands, however will not follow commands (simple or complex). Pt is incontinent and will follow with eyes as movement occurs."  Diagnostics: 06/24/12 Repeat CXR's, Trend labs, Urine cult's, ST eval  Treatment: 06/24/12 Progr note.Marland KitchenMarland Kitchen"Continue empiric IV vancomycin, Levaquin and cefepime. Monitor"..."Continue IV phenytoin per pharmacy"...  Reviewed:  no additional documentation provided; 06/29/12>progress notes rev'd>query closed.orm  Thank You,  Toribio Harbour, RN, BSN, CCDS Certified Clinical Documentation Specialist Pager: [phone]  [facility] Information Management [place]

## 2012-06-26 DIAGNOSIS — D72829 Elevated white blood cell count, unspecified: Secondary | ICD-10-CM

## 2012-06-26 DIAGNOSIS — G40909 Epilepsy, unspecified, not intractable, without status epilepticus: Secondary | ICD-10-CM

## 2012-06-26 LAB — BASIC METABOLIC PANEL
BUN: 8 mg/dL (ref 6–23)
Chloride: 111 mEq/L (ref 96–112)
Creatinine, Ser: 0.64 mg/dL (ref 0.50–1.35)
GFR calc Af Amer: >90 mL/min (ref >90)
Glucose, Bld: 112 mg/dL — ABNORMAL HIGH (ref 70–99)
Potassium: 4.5 mEq/L (ref 3.5–5.1)

## 2012-06-26 NOTE — Progress Notes (Signed)
[facility] PROGRESS NOTE  [name] AVW:[id number] DOB: 08/23/1956 DOA: 06/22/2012 PCP: Kaleen Mask, MD  Assessment/Plan: Principal Problem:  *Sepsis Active Problems:  Healthcare-associated pneumonia  Epilepsy  Mental retardation  Nephrolithiasis  Hydronephrosis  Leukocytosis  Hypokalemia  UTI (lower urinary tract infection)  Iron deficiency anemia  Fungemia  #1 sepsis  Likely secondary to fungemia vs bacteremia from recent bladder instrumentation versus secondary to urinary tract infection versus questionable healthcare associated pneumonia. Patient's tachycardia is improving. Patient's white count is trending down. Patient with no fever overnight. Patient's temperature was as high as 102.7 2 days ago. Respiratory rate is improving. Blood cultures are positive for yeast one out of two. Urine cultures also growing yeast.. Sputum cultures pending. Will D/C IV vancomycin and cefipime. Continue Levaquin. Will start patient on IV micafungin while speciation of the is pending. Will need to discontinue PICC line and place peripheral IVs. Patient will likely need at least 3 weeks of treatment.  - Will f/u with ID's recommendations: They recommend placing Picc line once blood cultures negative for 72 hours.  #2 Fungemia  Blood cultures one out of 2 with yeast. Urine was also yeast. Patient will be started on IV micafungin and will need at least 3 weeks of therapy. Will need to remove the PICC line. We'll need to try to get some peripheral IVs. Once infection has been cleared may replace PICC line if needed. Once PICC line has been removed will likely need to reculture the blood.  - ID on board and we will f/u with their recommendations: Pt will need outpatient eye exam, TTE pending  #3 urinary tract infection  Urine cultures are growing yeast 55,000 colonies. Patient also with yeast in his blood stream. Patient has been started on IV micafungin.Continue Rocephin.  #4  questionable healthcare associated pneumonia  On admission and per family patient did have a productive cough and some congestion and also question of possible aspiration pneumonia. Doubt if patient has a pneumonia. Patient has been seen by speech therapy and does not show any signs or symptoms of aspiration when being observed being fed. Sputum cultures pending.  #5 status post right renal pelvic stone laser lithotripsy  Dr. Annabell Howells of urology is following and stent has been removed however Foley catheter will remain for now per urology. Will DC IV vancomycin. Patient's blood cultures is growing yeast. Will add IV micafungin to his regimen. Continue IV Rocephin for now. Urology is following and appreciate input and recommendations.  OK to d/c off of foley once ready for discharge reportedly.  #6 hypokalemia  Repleted.   #7 severe iron deficiency anemia  H&H is stable. Per urology minimal blood loss. Patient does not have any gross GI bleed. Anemia panel consistent with severe iron deficiency anemia with iron level less than 10. Patient may benefit from IV and later stayed. May also benefit from iron supplements on discharge. Follow H&H.   #8 history of seizure/epilepsy  No seizures noted. Phenytoin level was 13.4. Corrected phenytoin level is 20.3. Continue IV phenytoin per pharmacy. Once patient is able to tolerate oral intake will change IV phenytoin 2 liquid phenytoin.   #9 mental retardation  Stable.   #10 prophylaxis  Lovenox for DVT prophylaxis.   Code Status: DO NOT RESUSCITATE  Family Communication: No family at bedside. Patient is nonverbal.  Disposition Plan: There continues to remain stable may consider transfer to telemetry tomorrow. Back to nursing home when medically stable   Brief narrative:  Charles Humphrey is a  56 yo wm, [facility] resident, admitted via [facility] for fever, pneumonia, and possible urosepsis, 1 day following R ureteroscopy and laser fragmentation of R renal  pelvic stone, with placement of JJ stent and foley catheter. Stent and catheter ate to be left for 48 hrs, to be removed together in the outpatient setting.  The patient underwent Left percutaneous nephrolithotomy for multiple large renal stones in March, 2013 ( aggregate > 2cm). At the time he was noted to have an asymptomatic 6mm R renal pelvic stone. However, he developed pain ( father's interpretation), and repeat evaluation showed stone size increased to 7mm, with R hydronephrosis at the [facility]. He failed conservative therapy, and underwent R ureteroscopy and laser lithotripsy per Dr. Annabell Howells on 06/22/12. JJ stent left in place, taped to a foley catheter. However , pt developed a fever, and lung congestion last night and was brought to [facility] by [facility] for care. He is admitted to [facility].   Consultants:  Urology: Dr. Patsi Sears 06/23/2012 being followed by Dr. Annabell Howells Procedures:  Chest x-ray 06/23/2012  Chest x-ray 06/24/2012 Antibiotics:  IV Maxipime 06/23/2012--->06/25/12  IV Levaquin 06/23/2012  IV vancomycin 06/23/2012--> 06/25/2012  IV micafungin 06/25/2012   HPI/Subjective: Pt is non verbal.  No new issues reported by Nursing. No acute issues reported overnight.  Objective: Filed Vitals:   06/26/12 0612 06/26/12 0814 06/26/12 1411 06/26/12 1420  BP: 127/85   125/87  Pulse: 111   112  Temp: 99 F (37.2 C)   97.5 F (36.4 C)  TempSrc: Axillary   Axillary  Resp: 20   20  Height:      Weight:      SpO2: 94% 92% 94% 95%    Intake/Output Summary (Last 24 hours) at 06/26/12 1757 Last data filed at 06/26/12 1755  Gross per 24 hour  Intake   1805 ml  Output   1550 ml  Net    255 ml    Exam:   General:  Pt in NAD, Alert and Awake  Cardiovascular: RRR, No MRG  Respiratory: CTA BL, no wheezes or increased work of breathing  Abdomen: NT, ND  Data Reviewed: Basic Metabolic Panel:  Lab 06/26/12 1610 06/25/12 0420 06/24/12 0916 06/24/12 0355  06/23/12 0014 06/23/12 0013  NA 141 144 -- 146* 144 --  K 4.5 3.8 -- 3.4* 3.3* --  CL 111 113* -- 115* 108 --  CO2 20 25 -- 24 27 --  GLUCOSE 112* 133* -- 155* 149* --  BUN 8 10 -- 12 7 --  CREATININE 0.64 0.90 -- 1.06 0.58 0.60  CALCIUM 8.2* 8.3* -- 7.6* 8.5 --  MG -- -- 1.8 -- -- 1.8  PHOS -- -- -- -- -- --   Liver Function Tests:  Lab 06/24/12 0355 06/23/12 0013  AST 32 19  ALT 20 11  ALKPHOS 101 133*  BILITOT 0.2* 0.2*  PROT 5.4* 7.1  ALBUMIN 1.8* 2.8*   No results found for this basename: LIPASE:5,AMYLASE:5 in the last 168 hours No results found for this basename: AMMONIA:5 in the last 168 hours CBC:  Lab 06/25/12 0420 06/24/12 0355 06/23/12 0014 06/23/12 0013  WBC 7.4 7.3 14.6* 14.0*  NEUTROABS 5.5 6.5 -- --  HGB 11.3* 10.5* 14.0 14.6  HCT 35.1* 33.2* 42.5 45.5  MCV 95.6 96.8 96.2 98.9  PLT 228 218 244 248   Cardiac Enzymes:  Lab 06/23/12 0014  CKTOTAL --  CKMB --  CKMBINDEX --  TROPONINI <0.30  BNP (last 3 results) No results found for this basename: PROBNP:3 in the last 8760 hours CBG: No results found for this basename: GLUCAP:5 in the last 168 hours  Recent Results (from the past 240 hour(s))  CULTURE, BLOOD (ROUTINE X 2)     Status: Normal   Collection Time   06/16/12  7:55 PM      Component Value Range Status Comment   Specimen Description BLOOD RIGHT HAND   Final    Special Requests BOTTLES DRAWN AEROBIC ONLY 3CC   Final    Culture  Setup Time 06/17/2012 03:32   Final    Culture NO GROWTH 5 DAYS   Final    Report Status 06/23/2012 FINAL   Final   CULTURE, BLOOD (ROUTINE X 2)     Status: Normal   Collection Time   06/16/12  8:00 PM      Component Value Range Status Comment   Specimen Description BLOOD LEFT HAND   Final    Special Requests BOTTLES DRAWN AEROBIC ONLY 3CC   Final    Culture  Setup Time 06/17/2012 03:32   Final    Culture NO GROWTH 5 DAYS   Final    Report Status 06/23/2012 FINAL   Final   URINE CULTURE     Status: Normal    Collection Time   06/16/12  8:08 PM      Component Value Range Status Comment   Specimen Description URINE, CATHETERIZED   Final    Special Requests NONE   Final    Culture  Setup Time 06/17/2012 03:24   Final    Colony Count NO GROWTH   Final    Culture NO GROWTH   Final    Report Status 06/17/2012 FINAL   Final   MRSA PCR SCREENING     Status: Normal   Collection Time   06/17/12 10:30 AM      Component Value Range Status Comment   MRSA by PCR NEGATIVE  NEGATIVE Final   CULTURE, BLOOD (ROUTINE X 2)     Status: Normal (Preliminary result)   Collection Time   06/23/12 12:13 AM      Component Value Range Status Comment   Specimen Description BLOOD RIGHT HAND   Final    Special Requests BOTTLES DRAWN AEROBIC ONLY   Final    Culture  Setup Time 06/23/2012 03:52   Final    Culture     Final    Value: YEAST     Note: Gram Stain Report Called to,Read Back By and Verified With: VELINA PERITIN @ 0045 ON 06/25/12 BY GOLLD   Report Status PENDING   Incomplete   CULTURE, BLOOD (ROUTINE X 2)     Status: Normal (Preliminary result)   Collection Time   06/23/12  1:10 AM      Component Value Range Status Comment   Specimen Description BLOOD RIGHT   Final    Special Requests BOTTLES DRAWN AEROBIC ONLY 3CC   Final    Culture  Setup Time 06/23/2012 03:52   Final    Culture     Final    Value:        BLOOD CULTURE RECEIVED NO GROWTH TO DATE CULTURE WILL BE HELD FOR 5 DAYS BEFORE ISSUING A FINAL NEGATIVE REPORT   Report Status PENDING   Incomplete   URINE CULTURE     Status: Normal   Collection Time   06/23/12  4:26 AM      Component  Value Range Status Comment   Specimen Description URINE, RANDOM   Final    Special Requests NONE   Final    Culture  Setup Time 06/23/2012 09:03   Final    Colony Count 55,000 COLONIES/ML   Final    Culture YEAST   Final    Report Status 06/24/2012 FINAL   Final   MRSA PCR SCREENING     Status: Normal   Collection Time   06/23/12  9:12 AM      Component Value  Range Status Comment   MRSA by PCR NEGATIVE  NEGATIVE Final      Studies: Ct Abdomen Pelvis Wo Contrast  06/04/2012  *RADIOLOGY REPORT*  Clinical Data: History kidney stones.  Reevaluation  CT ABDOMEN AND PELVIS WITHOUT CONTRAST  Technique:  Multidetector CT imaging of the abdomen and pelvis was performed following the standard protocol without intravenous contrast.  Comparison: CT 05/21/2012  Findings:  Renal:  Non-IV contrast images demonstrate a partially obstructing calculus at the right ureteral pelvic junction measuring 9 mm not changed from prior.  There is mild hydronephrosis of the right which is similar to prior.  There is nonobstructing 5 mm calculus in the upper pole right kidney.  No distal right ureteral calculi. In the left kidney, there are three nonobstructing calculi ranging in size from 3 to  4 mm.  No obstructive uropathy on the left.  No left ureterolithiasis.  Lung bases are clear.  No pericardial fluid.  No focal hepatic lesion.  The gallbladder is collapsed.  The pancreas, spleen, adrenal gland are normal.  The stomach is gas filled similar to prior.  Small bowel and cecum are normal.  The rectum and sigmoid colon is smooth similar to prior with mild wall thickening.  Abdominal aorta normal caliber.  No retroperitoneal periportal lymphadenopathy.  No free fluid the pelvis.  The prostate gland is normal.  The bladder is mildly distended.  No evidence of bladder stones. Review of  bone windows demonstrates no aggressive osseous lesions.  IMPRESSION:  1.  Partially obstructing calculus at the right ureteral pelvic junction is not changed from prior. 2.  Bilateral nephrolithiasis.  3.   Smooth contour to the sigmoid colon and proximal rectum could represent chronic inflammation.  There is some retention of stool in the rectum.  Original Report Authenticated By: Genevive Bi, M.D.   Dg Chest 2 View  06/23/2012  *RADIOLOGY REPORT*  Clinical Data: Tachycardia, fever, shortness of breath.   CHEST - 2 VIEW  Comparison: 06/16/2012  Findings: Hypoaeration with interstitial vascular crowding. Prominent cardiomediastinal contours, similar to prior.  Central vascular congestion.  Mild lung base opacities.  Otherwise, no focal consolidation.  No pleural effusion or pneumothorax. Osteopenia.  IMPRESSION: Hypoaeration and mild bibasilar opacities; atelectasis versus infiltrate.  Prominent cardiomediastinal contours are similar to prior.  Original Report Authenticated By: Waneta Martins, M.D.   Dg Chest 2 View  06/16/2012  *RADIOLOGY REPORT*  Clinical Data: Fever.  Short of breath.  CHEST - 2 VIEW  Comparison: 02/05/2012  Findings: Lungs are markedly under aerated with bibasilar atelectasis.  Heart is upper normal in size allowing for low volumes.  There is persistent marked distention of the transverse colon and probably associated small bowel loops as previously seen. No obvious free intraperitoneal gas.  IMPRESSION: Bibasilar atelectasis.  Stable colonic distention.  Original Report Authenticated By: [name], M.D.   Dg Abd 1 View  06/17/2012  *RADIOLOGY REPORT*  Clinical Data: Right ureteral calculus  and fever.  ABDOMEN - 1 VIEW  Comparison: CT of the abdomen dated 06/04/2012  Findings: Single image was obtained intraoperatively with a C-arm and saved.  This shows the superior aspect of a ureteral stent which extends up into the expected position of the central collecting system.  IMPRESSION: Imaging during right ureteral stent placement.  Original Report Authenticated By: Reola Calkins, M.D.   Dg Chest Port 1 View  06/24/2012  *RADIOLOGY REPORT*  Clinical Data: Evaluate bibasilar opacities  PORTABLE CHEST - 1 VIEW  Comparison: 06/23/2012; 06/16/2012  Findings: Grossly unchanged enlarged cardiac silhouette and mediastinal contours.  Interval placement of a right upper extremity approach PICC line with tip overlying the superior cavoatrial junction.  Lung volumes remain persistently  reduced. Pulmonary vasculature is indistinct with cephalization of flow. Minimal increase in bilateral infrahilar heterogeneous opacities. No definite pleural effusion or pneumothorax.  Unchanged bones.  IMPRESSION: 1.  Right upper extremity approach PICC line tip overlies the superior cavoatrial junction. 2. Minimal increase in bilateral infrahilar heterogeneous opacities, atelectasis versus infiltrate.  Further evaluation with a PA and lateral chest radiograph may be obtained as clinically indicated. 3.  Suspect mild pulmonary edema.  Original Report Authenticated By: Waynard Reeds, M.D.    Scheduled Meds:   . albuterol  2.5 mg Nebulization Q6H  . antiseptic oral rinse  15 mL Mouth Rinse q12n4p  . cefTRIAXone (ROCEPHIN)  IV  2 g Intravenous Q24H  . chlorhexidine  15 mL Mouth Rinse BID  . enoxaparin (LOVENOX) injection  40 mg Subcutaneous Q24H  . micafungin (MYCAMINE) IV  100 mg Intravenous Daily  . PHENObarbital  130 mg Intravenous QHS  . phenytoin (DILANTIN) IV  100 mg Intravenous Q8H  . sodium chloride  10-40 mL Intracatheter Q12H   Continuous Infusions:   . sodium chloride 0.45 % with kcl 75 mL/hr at 06/26/12 0516    Principal Problem:  *Sepsis Active Problems:  Healthcare-associated pneumonia  Epilepsy  Mental retardation  Nephrolithiasis  Hydronephrosis  Leukocytosis  Hypokalemia  UTI (lower urinary tract infection)  Iron deficiency anemia  Fungemia    Time spent: > 35 minutes    Penny Pia  [facility] Pager [phone]. If 8PM-8AM, please contact night-coverage at [email or web address], password [facility] 06/26/2012, 5:57 PM  LOS: 4 days

## 2012-06-26 NOTE — Progress Notes (Signed)
Patient ID: Charles Humphrey, male   DOB: 1956/04/06, 56 y.o.   MRN: [id number]    Subjective: Patient resting comfortably.  His stent was removed yesterday and the foley remains in place draining clear urine.  He has been afebrile and has excellent UOP. ROS: Pt non-communicative.  Objective: Vital signs in last 24 hours: Temp:  [98.2 F (36.8 C)-99.6 F (37.6 C)] 99 F (37.2 C) (08/03 0612) Pulse Rate:  [111-120] 111  (08/03 0612) Resp:  [18-32] 20  (08/03 0612) BP: (110-134)/(72-87) 127/85 mmHg (08/03 0612) SpO2:  [92 %-99 %] 92 % (08/03 0814) Weight:  [81.2 kg (179 lb 0.2 oz)] 81.2 kg (179 lb 0.2 oz) (08/03 0000)  Intake/Output from previous day: 08/02 0701 - 08/03 0700 In: 1965 [P.O.:360; I.V.:1605] Out: 2550 [Urine:2550] Intake/Output this shift:    General appearance: no distress  Lab Results:   Basename 06/25/12 0420 06/24/12 0355  WBC 7.4 7.3  HGB 11.3* 10.5*  HCT 35.1* 33.2*  PLT 228 218   BMET  Basename 06/26/12 0634 06/25/12 0420  NA 141 144  K 4.5 3.8  CL 111 113*  CO2 20 25  GLUCOSE 112* 133*  BUN 8 10  CREATININE 0.64 0.90  CALCIUM 8.2* 8.3*   PT/INR No results found for this basename: LABPROT:2,INR:2 in the last 72 hours ABG No results found for this basename: PHART:2,PCO2:2,PO2:2,HCO3:2 in the last 72 hours  Studies/Results: No results found.  Anti-infectives: Anti-infectives     Start     Dose/Rate Route Frequency Ordered Stop   06/25/12 1200   fluconazole (DIFLUCAN) IVPB 400 mg  Status:  Discontinued        400 mg 200 mL/hr over 60 Minutes Intravenous Every 24 hours 06/25/12 0900 06/25/12 0925   06/25/12 1200   cefTRIAXone (ROCEPHIN) 2 g in dextrose 5 % 50 mL IVPB        2 g 100 mL/hr over 30 Minutes Intravenous Every 24 hours 06/25/12 1057     06/25/12 1100   micafungin (MYCAMINE) 100 mg in sodium chloride 0.9 % 100 mL IVPB        100 mg 100 mL/hr over 1 Hours Intravenous Daily 06/25/12 0926 07/16/12 0959   06/25/12 0130    fluconazole (DIFLUCAN) IVPB 200 mg  Status:  Discontinued        200 mg 100 mL/hr over 60 Minutes Intravenous Every 24 hours 06/25/12 0126 06/25/12 0138   06/24/12 1400   fluconazole (DIFLUCAN) IVPB 200 mg  Status:  Discontinued        200 mg 100 mL/hr over 60 Minutes Intravenous Every 24 hours 06/24/12 1320 06/25/12 0859   06/23/12 1000   vancomycin (VANCOCIN) IVPB 1000 mg/200 mL premix  Status:  Discontinued        1,000 mg 200 mL/hr over 60 Minutes Intravenous Every 8 hours 06/23/12 0917 06/25/12 0838   06/23/12 0930   ceFEPIme (MAXIPIME) 1 g in dextrose 5 % 50 mL IVPB  Status:  Discontinued        1 g 100 mL/hr over 30 Minutes Intravenous 3 times per day 06/23/12 0840 06/25/12 1005   06/23/12 0930   levofloxacin (LEVAQUIN) IVPB 750 mg  Status:  Discontinued        750 mg 100 mL/hr over 90 Minutes Intravenous Every 24 hours 06/23/12 0840 06/25/12 1057   06/23/12 0230   vancomycin (VANCOCIN) IVPB 1000 mg/200 mL premix  Status:  Discontinued        1,000 mg 200 mL/hr  over 60 Minutes Intravenous  Once 06/23/12 0225 06/24/12 1906   06/23/12 0230  piperacillin-tazobactam (ZOSYN) IVPB 3.375 g       3.375 g 100 mL/hr over 30 Minutes Intravenous  Once 06/23/12 0225 06/23/12 0334          Current Facility-Administered Medications  Medication Dose Route Frequency Provider Last Rate Last Dose  . acetaminophen (TYLENOL) suppository 650 mg  650 mg Rectal Q4H PRN Rodolph Bong, MD   650 mg at 06/23/12 2021  . albuterol (PROVENTIL) (5 MG/ML) 0.5% nebulizer solution 2.5 mg  2.5 mg Nebulization Q6H Rodolph Bong, MD   2.5 mg at 06/26/12 0814  . antiseptic oral rinse (BIOTENE) solution 15 mL  15 mL Mouth Rinse q12n4p Rodolph Bong, MD   15 mL at 06/25/12 1537  . cefTRIAXone (ROCEPHIN) 2 g in dextrose 5 % 50 mL IVPB  2 g Intravenous Q24H Gardiner Barefoot, MD   2 g at 06/25/12 1226  . chlorhexidine (PERIDEX) 0.12 % solution 15 mL  15 mL Mouth Rinse BID Rodolph Bong, MD   15 mL at  06/25/12 2019  . enoxaparin (LOVENOX) injection 40 mg  40 mg Subcutaneous Q24H Tarry Kos, MD   40 mg at 06/25/12 1123  . feeding supplement (ENSURE COMPLETE) liquid 237 mL  237 mL Oral Daily PRN Anastasio Champion, RD      . micafungin (MYCAMINE) 100 mg in sodium chloride 0.9 % 100 mL IVPB  100 mg Intravenous Daily Rodolph Bong, MD   100 mg at 06/25/12 1123  . ondansetron (ZOFRAN) injection 4 mg  4 mg Intravenous Q6H PRN Rodolph Bong, MD      . PHENObarbital (LUMINAL) injection 130 mg  130 mg Intravenous QHS Roma Kayser Schorr, NP   130 mg at 06/25/12 2247  . phenytoin (DILANTIN) injection 100 mg  100 mg Intravenous Q8H Rodolph Bong, MD   100 mg at 06/26/12 0549  . sodium chloride 0.45 % 1,000 mL with potassium chloride 40 mEq infusion   Intravenous Continuous Rodolph Bong, MD 75 mL/hr at 06/26/12 [phone]    . sodium chloride 0.9 % injection 10-40 mL  10-40 mL Intracatheter Q12H Rodolph Bong, MD   10 mL at 06/25/12 2253  . sodium chloride 0.9 % injection 10-40 mL  10-40 mL Intracatheter PRN Rodolph Bong, MD      . DISCONTD: ceFEPIme (MAXIPIME) 1 g in dextrose 5 % 50 mL IVPB  1 g Intravenous Q8H Tarry Kos, MD   1 g at 06/25/12 0615  . DISCONTD: fluconazole (DIFLUCAN) IVPB 200 mg  200 mg Intravenous Q24H Rodolph Bong, MD   200 mg at 06/24/12 1359  . DISCONTD: fluconazole (DIFLUCAN) IVPB 400 mg  400 mg Intravenous Q24H Rodolph Bong, MD      . DISCONTD: levofloxacin (LEVAQUIN) IVPB 750 mg  750 mg Intravenous Q24H Tarry Kos, MD   750 mg at 06/24/12 0926  . DISCONTD: vancomycin (VANCOCIN) IVPB 1000 mg/200 mL premix  1,000 mg Intravenous Q8H Loma Messing [place], PHARMD   1,000 mg at 06/25/12 0121    Assessment: Urosepsis post right ureteroscopy resolving.  Plan: D/C foley when not needed for medical management. Discharge per hospitalist service.   LOS: 4 days    Tavion Senkbeil J 06/26/2012

## 2012-06-27 ENCOUNTER — Inpatient Hospital Stay (HOSPITAL_COMMUNITY): Payer: Medicare Other

## 2012-06-27 DIAGNOSIS — E876 Hypokalemia: Secondary | ICD-10-CM

## 2012-06-27 DIAGNOSIS — N179 Acute kidney failure, unspecified: Secondary | ICD-10-CM

## 2012-06-27 LAB — PHENYTOIN LEVEL, FREE AND TOTAL: Phenytoin Bound: 10.6 mg/L

## 2012-06-27 MED ORDER — PHENOBARBITAL 32.4 MG PO TABS
162.0000 mg | ORAL_TABLET | Freq: Every day | ORAL | Status: DC
  Administered 2012-06-27: 162 mg via ORAL
  Filled 2012-06-27 (×4): qty 5

## 2012-06-27 MED ORDER — PHENOBARBITAL SODIUM 130 MG/ML IJ SOLN
130.0000 mg | Freq: Once | INTRAMUSCULAR | Status: AC
  Administered 2012-06-27: 130 mg via INTRAVENOUS
  Filled 2012-06-27: qty 1

## 2012-06-27 MED ORDER — FERROUS SULFATE 325 (65 FE) MG PO TABS
325.0000 mg | ORAL_TABLET | Freq: Two times a day (BID) | ORAL | Status: DC
  Administered 2012-06-28 – 2012-06-30 (×5): 325 mg via ORAL
  Filled 2012-06-27 (×7): qty 1

## 2012-06-27 MED ORDER — PHENYTOIN 125 MG/5ML PO SUSP
175.0000 mg | Freq: Two times a day (BID) | ORAL | Status: DC
  Administered 2012-06-27 – 2012-06-30 (×5): 175 mg via ORAL
  Filled 2012-06-27 (×7): qty 8

## 2012-06-27 MED ORDER — PHENYTOIN SODIUM 50 MG/ML IJ SOLN
100.0000 mg | Freq: Once | INTRAMUSCULAR | Status: AC
  Administered 2012-06-27: 100 mg via INTRAVENOUS
  Filled 2012-06-27: qty 2

## 2012-06-27 NOTE — Progress Notes (Signed)
Patient would not take dilantin and phenobarb by mouth on tonight, on call notified. One time dosage for iv administration was given for tonight.

## 2012-06-27 NOTE — Progress Notes (Signed)
[facility] PROGRESS NOTE  Charles Humphrey:[id number] DOB: Oct 09, 1956 DOA: 06/22/2012 PCP: Kaleen Mask, MD  Assessment/Plan: Principal Problem:  *Sepsis Active Problems:  Healthcare-associated pneumonia  Epilepsy  Mental retardation  Nephrolithiasis  Hydronephrosis  Leukocytosis  Hypokalemia  UTI (lower urinary tract infection)  Iron deficiency anemia  Fungemia  #1 sepsis  Likely secondary to fungemia from recent bladder instrumentation versus secondary to urinary tract infection. Patient's tachycardia is improving. Patient's white count is trending down. Patient with no fever overnight. Patient's temperature was as high as 102.7 3 days ago. Respiratory rate is improving. Blood cultures are positive for yeast one out of two. Urine cultures also growing yeast.. Sputum cultures pending. Pt is currently on Rocephin. Will continue patient on IV micafungin while speciation of the organism is pending. Will need to discontinue PICC line and place peripheral IVs. Patient will likely need at least 3 weeks of treatment.  - Will f/u with ID's recommendations: They recommend placing Picc line once blood cultures negative for 72 hours. Also pending what type of yeast grows per ID therapy may change. Will follow closely  #2 Fungemia  - ID on board and we will f/u with their recommendations: Pt will need outpatient eye exam, TTE pending  - On Micafungin will plan on continuing for now.  #3 urinary tract infection  Urine cultures are growing yeast 55,000 colonies. Patient also with yeast in his blood stream. Patient has been started on IV micafungin. Continue Rocephin.   #4 questionable healthcare associated pneumonia  On admission and per family patient did have a productive cough and some congestion and also question of possible aspiration pneumonia. Doubt if patient has a pneumonia. Patient has been seen by speech therapy and does not show any signs or symptoms of aspiration  when being observed being fed.  Sputum cultures pending.   #5 status post right renal pelvic stone laser lithotripsy  Dr. Annabell Howells of urology is following and stent has been removed however Foley catheter will remain for now per urology. Will DC IV vancomycin. Patient's blood cultures is growing yeast. Will add IV micafungin to his regimen. Continue IV Rocephin for now. Urology is following and appreciate input and recommendations. OK to d/c off of foley once ready for discharge reportedly.   #6 hypokalemia  Repleted.   #7 severe iron deficiency anemia  H&H is stable. Per urology minimal blood loss. Patient does not have any gross GI bleed. Anemia panel consistent with severe iron deficiency anemia with iron level less than 10. Patient may benefit from IV and later stayed. May also benefit from iron supplements on discharge. Follow H&H.  - Start Ferrous sulfate  #8 history of seizure/epilepsy  No seizures noted.  Will transition antiepileptic medication to oral today.  #9 mental retardation  Stable.   #10 prophylaxis   Lovenox for DVT prophylaxis.  Code Status: DO NOT RESUSCITATE  Family Communication: No family at bedside. Patient is nonverbal.  Disposition Plan: There continues to remain stable may consider transfer to telemetry tomorrow. Back to nursing home when medically stable   Brief narrative:  Charles Humphrey is a 56 yo wm, [facility] resident, admitted via [facility] for fever, pneumonia, and possible urosepsis, 1 day following R ureteroscopy and laser fragmentation of R renal pelvic stone, with placement of JJ stent and foley catheter. Stent and catheter ate to be left for 48 hrs, to be removed together in the outpatient setting.  The patient underwent Left percutaneous nephrolithotomy for multiple large renal stones  in March, 2013 ( aggregate > 2cm). At the time he was noted to have an asymptomatic 6mm R renal pelvic stone. However, he developed pain ( father's interpretation), and  repeat evaluation showed stone size increased to 7mm, with R hydronephrosis at the [facility]. He failed conservative therapy, and underwent R ureteroscopy and laser lithotripsy per Dr. Annabell Howells on 06/22/12. JJ stent left in place, taped to a foley catheter. However , pt developed a fever, and lung congestion last night and was brought to [facility] by [facility] for care. He is admitted to [facility].  Consultants:  Urology: Dr. Patsi Sears 06/23/2012 being followed by Dr. Annabell Howells Procedures:  Chest x-ray 06/23/2012  Chest x-ray 06/24/2012 Antibiotics:  IV Maxipime 06/23/2012--->06/25/12  IV Levaquin 06/23/2012  IV vancomycin 06/23/2012--> 06/25/2012  IV micafungin 06/25/2012    HPI/Subjective: Pt is non verbal.  No acute issues overnight.  Objective: Filed Vitals:   06/27/12 0555 06/27/12 0931 06/27/12 1433 06/27/12 1449  BP: 112/80   110/80  Pulse: 100   106  Temp: 99.1 F (37.3 C)   98.4 F (36.9 C)  TempSrc: Axillary   Axillary  Resp: 19   20  Height:      Weight:      SpO2: 95% 92% 94% 95%    Intake/Output Summary (Last 24 hours) at 06/27/12 1513 Last data filed at 06/27/12 1215  Gross per 24 hour  Intake 1347.68 ml  Output   1500 ml  Net -152.32 ml    Exam:  General: Pt in NAD, Alert and Awake  Cardiovascular: RRR, No MRG  Respiratory: CTA BL, no wheezes or increased work of breathing  Abdomen: NT, ND  Data Reviewed: Basic Metabolic Panel:  Lab 06/26/12 1610 06/25/12 0420 06/24/12 0916 06/24/12 0355 06/23/12 0014 06/23/12 0013  NA 141 144 -- 146* 144 --  K 4.5 3.8 -- 3.4* 3.3* --  CL 111 113* -- 115* 108 --  CO2 20 25 -- 24 27 --  GLUCOSE 112* 133* -- 155* 149* --  BUN 8 10 -- 12 7 --  CREATININE 0.64 0.90 -- 1.06 0.58 0.60  CALCIUM 8.2* 8.3* -- 7.6* 8.5 --  MG -- -- 1.8 -- -- 1.8  PHOS -- -- -- -- -- --   Liver Function Tests:  Lab 06/24/12 0355 06/23/12 0013  AST 32 19  ALT 20 11  ALKPHOS 101 133*  BILITOT 0.2* 0.2*  PROT 5.4* 7.1    ALBUMIN 1.8* 2.8*   No results found for this basename: LIPASE:5,AMYLASE:5 in the last 168 hours No results found for this basename: AMMONIA:5 in the last 168 hours CBC:  Lab 06/25/12 0420 06/24/12 0355 06/23/12 0014 06/23/12 0013  WBC 7.4 7.3 14.6* 14.0*  NEUTROABS 5.5 6.5 -- --  HGB 11.3* 10.5* 14.0 14.6  HCT 35.1* 33.2* 42.5 45.5  MCV 95.6 96.8 96.2 98.9  PLT 228 218 244 248   Cardiac Enzymes:  Lab 06/23/12 0014  CKTOTAL --  CKMB --  CKMBINDEX --  TROPONINI <0.30   BNP (last 3 results) No results found for this basename: PROBNP:3 in the last 8760 hours CBG: No results found for this basename: GLUCAP:5 in the last 168 hours  Recent Results (from the past 240 hour(s))  CULTURE, BLOOD (ROUTINE X 2)     Status: Normal (Preliminary result)   Collection Time   06/23/12 12:13 AM      Component Value Range Status Comment   Specimen Description BLOOD RIGHT HAND   Final  Special Requests BOTTLES DRAWN AEROBIC ONLY   Final    Culture  Setup Time 06/23/2012 03:52   Final    Culture     Final    Value: YEAST     Note: Gram Stain Report Called to,Read Back By and Verified With: VELINA PERITIN @ 0045 ON 06/25/12 BY GOLLD   Report Status PENDING   Incomplete   CULTURE, BLOOD (ROUTINE X 2)     Status: Normal (Preliminary result)   Collection Time   06/23/12  1:10 AM      Component Value Range Status Comment   Specimen Description BLOOD RIGHT   Final    Special Requests BOTTLES DRAWN AEROBIC ONLY 3CC   Final    Culture  Setup Time 06/23/2012 03:52   Final    Culture     Final    Value:        BLOOD CULTURE RECEIVED NO GROWTH TO DATE CULTURE WILL BE HELD FOR 5 DAYS BEFORE ISSUING A FINAL NEGATIVE REPORT   Report Status PENDING   Incomplete   URINE CULTURE     Status: Normal   Collection Time   06/23/12  4:26 AM      Component Value Range Status Comment   Specimen Description URINE, RANDOM   Final    Special Requests NONE   Final    Culture  Setup Time 06/23/2012 09:03    Final    Colony Count 55,000 COLONIES/ML   Final    Culture YEAST   Final    Report Status 06/24/2012 FINAL   Final   MRSA PCR SCREENING     Status: Normal   Collection Time   06/23/12  9:12 AM      Component Value Range Status Comment   MRSA by PCR NEGATIVE  NEGATIVE Final      Studies: Ct Abdomen Pelvis Wo Contrast  06/04/2012  *RADIOLOGY REPORT*  Clinical Data: History kidney stones.  Reevaluation  CT ABDOMEN AND PELVIS WITHOUT CONTRAST  Technique:  Multidetector CT imaging of the abdomen and pelvis was performed following the standard protocol without intravenous contrast.  Comparison: CT 05/21/2012  Findings:  Renal:  Non-IV contrast images demonstrate a partially obstructing calculus at the right ureteral pelvic junction measuring 9 mm not changed from prior.  There is mild hydronephrosis of the right which is similar to prior.  There is nonobstructing 5 mm calculus in the upper pole right kidney.  No distal right ureteral calculi. In the left kidney, there are three nonobstructing calculi ranging in size from 3 to  4 mm.  No obstructive uropathy on the left.  No left ureterolithiasis.  Lung bases are clear.  No pericardial fluid.  No focal hepatic lesion.  The gallbladder is collapsed.  The pancreas, spleen, adrenal gland are normal.  The stomach is gas filled similar to prior.  Small bowel and cecum are normal.  The rectum and sigmoid colon is smooth similar to prior with mild wall thickening.  Abdominal aorta normal caliber.  No retroperitoneal periportal lymphadenopathy.  No free fluid the pelvis.  The prostate gland is normal.  The bladder is mildly distended.  No evidence of bladder stones. Review of  bone windows demonstrates no aggressive osseous lesions.  IMPRESSION:  1.  Partially obstructing calculus at the right ureteral pelvic junction is not changed from prior. 2.  Bilateral nephrolithiasis.  3.   Smooth contour to the sigmoid colon and proximal rectum could represent chronic  inflammation.  There  is some retention of stool in the rectum.  Original Report Authenticated By: Genevive Bi, M.D.   Dg Chest 2 View  06/23/2012  *RADIOLOGY REPORT*  Clinical Data: Tachycardia, fever, shortness of breath.  CHEST - 2 VIEW  Comparison: 06/16/2012  Findings: Hypoaeration with interstitial vascular crowding. Prominent cardiomediastinal contours, similar to prior.  Central vascular congestion.  Mild lung base opacities.  Otherwise, no focal consolidation.  No pleural effusion or pneumothorax. Osteopenia.  IMPRESSION: Hypoaeration and mild bibasilar opacities; atelectasis versus infiltrate.  Prominent cardiomediastinal contours are similar to prior.  Original Report Authenticated By: Waneta Martins, M.D.   Dg Chest 2 View  06/16/2012  *RADIOLOGY REPORT*  Clinical Data: Fever.  Short of breath.  CHEST - 2 VIEW  Comparison: 02/05/2012  Findings: Lungs are markedly under aerated with bibasilar atelectasis.  Heart is upper normal in size allowing for low volumes.  There is persistent marked distention of the transverse colon and probably associated small bowel loops as previously seen. No obvious free intraperitoneal gas.  IMPRESSION: Bibasilar atelectasis.  Stable colonic distention.  Original Report Authenticated By: [name], M.D.   Dg Abd 1 View  06/17/2012  *RADIOLOGY REPORT*  Clinical Data: Right ureteral calculus and fever.  ABDOMEN - 1 VIEW  Comparison: CT of the abdomen dated 06/04/2012  Findings: Single image was obtained intraoperatively with a C-arm and saved.  This shows the superior aspect of a ureteral stent which extends up into the expected position of the central collecting system.  IMPRESSION: Imaging during right ureteral stent placement.  Original Report Authenticated By: Reola Calkins, M.D.   Dg Chest Port 1 View  06/24/2012  *RADIOLOGY REPORT*  Clinical Data: Evaluate bibasilar opacities  PORTABLE CHEST - 1 VIEW  Comparison: 06/23/2012; 06/16/2012   Findings: Grossly unchanged enlarged cardiac silhouette and mediastinal contours.  Interval placement of a right upper extremity approach PICC line with tip overlying the superior cavoatrial junction.  Lung volumes remain persistently reduced. Pulmonary vasculature is indistinct with cephalization of flow. Minimal increase in bilateral infrahilar heterogeneous opacities. No definite pleural effusion or pneumothorax.  Unchanged bones.  IMPRESSION: 1.  Right upper extremity approach PICC line tip overlies the superior cavoatrial junction. 2. Minimal increase in bilateral infrahilar heterogeneous opacities, atelectasis versus infiltrate.  Further evaluation with a PA and lateral chest radiograph may be obtained as clinically indicated. 3.  Suspect mild pulmonary edema.  Original Report Authenticated By: Waynard Reeds, M.D.    Scheduled Meds:   . albuterol  2.5 mg Nebulization Q6H  . antiseptic oral rinse  15 mL Mouth Rinse q12n4p  . cefTRIAXone (ROCEPHIN)  IV  2 g Intravenous Q24H  . chlorhexidine  15 mL Mouth Rinse BID  . enoxaparin (LOVENOX) injection  40 mg Subcutaneous Q24H  . micafungin (MYCAMINE) IV  100 mg Intravenous Daily  . PHENobarbital  162 mg Oral QHS  . sodium chloride  10-40 mL Intracatheter Q12H  . DISCONTD: PHENObarbital  130 mg Intravenous QHS  . DISCONTD: phenytoin (DILANTIN) IV  100 mg Intravenous Q8H   Continuous Infusions:   . sodium chloride 0.45 % with kcl 75 mL/hr at 06/26/12 0516    Principal Problem:  *Sepsis Active Problems:  Healthcare-associated pneumonia  Epilepsy  Mental retardation  Nephrolithiasis  Hydronephrosis  Leukocytosis  Hypokalemia  UTI (lower urinary tract infection)  Iron deficiency anemia  Fungemia    Time spent: > 30 minutes    Penny Pia  [facility] Pager [phone]. If 8PM-8AM, please  contact night-coverage at [email or web address], password [facility] 06/27/2012, 3:13 PM  LOS: 5 days

## 2012-06-27 NOTE — Progress Notes (Signed)
Patient ID: Charles Humphrey, male   DOB: [birth year], 56 y.o.   MRN: [id number]    Subjective: Charles Humphrey remains afebrile and has excellent urine output.  He still has a foley. ROS: Non-communicative.  Objective: Vital signs in last 24 hours: Temp:  [97.5 F (36.4 C)-99.1 F (37.3 C)] 99.1 F (37.3 C) (08/04 0555) Pulse Rate:  [100-112] 100  (08/04 0555) Resp:  [19-21] 19  (08/04 0555) BP: (112-125)/(80-87) 112/80 mmHg (08/04 0555) SpO2:  [94 %-95 %] 95 % (08/04 0555)  Intake/Output from previous day: 08/03 0701 - 08/04 0700 In: 1467.7 [P.O.:960; I.V.:507.7] Out: 2100 [Urine:2100] Intake/Output this shift:    General appearance: no distress  Lab Results:   [facility] 06/25/12 0420  WBC 7.4  HGB 11.3*  HCT 35.1*  PLT 228   BMET  Basename 06/26/12 0634 06/25/12 0420  NA 141 144  K 4.5 3.8  CL 111 113*  CO2 20 25  GLUCOSE 112* 133*  BUN 8 10  CREATININE 0.64 0.90  CALCIUM 8.2* 8.3*   PT/INR No results found for this basename: LABPROT:2,INR:2 in the last 72 hours ABG No results found for this basename: PHART:2,PCO2:2,PO2:2,HCO3:2 in the last 72 hours  Studies/Results: No results found.  Anti-infectives: Anti-infectives     Start     Dose/Rate Route Frequency Ordered Stop   06/25/12 1200   fluconazole (DIFLUCAN) IVPB 400 mg  Status:  Discontinued        400 mg 200 mL/hr over 60 Minutes Intravenous Every 24 hours 06/25/12 0900 06/25/12 0925   06/25/12 1200   cefTRIAXone (ROCEPHIN) 2 g in dextrose 5 % 50 mL IVPB        2 g 100 mL/hr over 30 Minutes Intravenous Every 24 hours 06/25/12 1057     06/25/12 1100   micafungin (MYCAMINE) 100 mg in sodium chloride 0.9 % 100 mL IVPB        100 mg 100 mL/hr over 1 Hours Intravenous Daily 06/25/12 0926 07/16/12 0959   06/25/12 0130   fluconazole (DIFLUCAN) IVPB 200 mg  Status:  Discontinued        200 mg 100 mL/hr over 60 Minutes Intravenous Every 24 hours 06/25/12 0126 06/25/12 0138   06/24/12 1400   fluconazole  (DIFLUCAN) IVPB 200 mg  Status:  Discontinued        200 mg 100 mL/hr over 60 Minutes Intravenous Every 24 hours 06/24/12 1320 06/25/12 0859   06/23/12 1000   vancomycin (VANCOCIN) IVPB 1000 mg/200 mL premix  Status:  Discontinued        1,000 mg 200 mL/hr over 60 Minutes Intravenous Every 8 hours 06/23/12 0917 06/25/12 0838   06/23/12 0930   ceFEPIme (MAXIPIME) 1 g in dextrose 5 % 50 mL IVPB  Status:  Discontinued        1 g 100 mL/hr over 30 Minutes Intravenous 3 times per day 06/23/12 0840 06/25/12 1005   06/23/12 0930   levofloxacin (LEVAQUIN) IVPB 750 mg  Status:  Discontinued        750 mg 100 mL/hr over 90 Minutes Intravenous Every 24 hours 06/23/12 0840 06/25/12 1057   06/23/12 0230   vancomycin (VANCOCIN) IVPB 1000 mg/200 mL premix  Status:  Discontinued        1,000 mg 200 mL/hr over 60 Minutes Intravenous  Once 06/23/12 0225 06/24/12 1906   06/23/12 0230  piperacillin-tazobactam (ZOSYN) IVPB 3.375 g       3.375 g 100 mL/hr over 30 Minutes Intravenous  Once  06/23/12 0225 06/23/12 0334          Current Facility-Administered Medications  Medication Dose Route Frequency Provider Last Rate Last Dose  . acetaminophen (TYLENOL) suppository 650 mg  650 mg Rectal Q4H PRN Rodolph Bong, MD   650 mg at 06/23/12 2021  . albuterol (PROVENTIL) (5 MG/ML) 0.5% nebulizer solution 2.5 mg  2.5 mg Nebulization Q6H Rodolph Bong, MD   2.5 mg at 06/27/12 0141  . antiseptic oral rinse (BIOTENE) solution 15 mL  15 mL Mouth Rinse q12n4p Rodolph Bong, MD   15 mL at 06/26/12 1600  . cefTRIAXone (ROCEPHIN) 2 g in dextrose 5 % 50 mL IVPB  2 g Intravenous Q24H Gardiner Barefoot, MD   2 g at 06/26/12 1210  . chlorhexidine (PERIDEX) 0.12 % solution 15 mL  15 mL Mouth Rinse BID Rodolph Bong, MD   15 mL at 06/26/12 2054  . enoxaparin (LOVENOX) injection 40 mg  40 mg Subcutaneous Q24H Tarry Kos, MD   40 mg at 06/26/12 1210  . feeding supplement (ENSURE COMPLETE) liquid 237 mL  237 mL  Oral Daily PRN Anastasio Champion, RD      . micafungin (MYCAMINE) 100 mg in sodium chloride 0.9 % 100 mL IVPB  100 mg Intravenous Daily Rodolph Bong, MD   100 mg at 06/26/12 1130  . ondansetron (ZOFRAN) injection 4 mg  4 mg Intravenous Q6H PRN Rodolph Bong, MD      . PHENObarbital (LUMINAL) injection 130 mg  130 mg Intravenous QHS Roma Kayser Schorr, NP   130 mg at 06/26/12 2304  . phenytoin (DILANTIN) injection 100 mg  100 mg Intravenous Q8H Rodolph Bong, MD   100 mg at 06/27/12 0650  . sodium chloride 0.45 % 1,000 mL with potassium chloride 40 mEq infusion   Intravenous Continuous Rodolph Bong, MD 75 mL/hr at 06/26/12 [phone]    . sodium chloride 0.9 % injection 10-40 mL  10-40 mL Intracatheter Q12H Rodolph Bong, MD   10 mL at 06/26/12 1000  . sodium chloride 0.9 % injection 10-40 mL  10-40 mL Intracatheter PRN Rodolph Bong, MD        Assessment: He is doing well on current therapy.  Plan: He can have the foley removed when not needed for medical management. I will order a renal US to assess the right kidney for hydronephrosis and residual stones since it will be easier to do here in the hospital than in our office at f/u.   LOS: 5 days    Priti Consoli J 06/27/2012

## 2012-06-27 NOTE — Progress Notes (Signed)
Infectious Diseases Initial Consultation  Reason for Consultation:  Management of yeast in blood   HPI: Charles Humphrey is a 56 y.o. male with hx of MR, NH resident came in with fever and UTI after lithotripsy.  Noted to have yeast in 1/2 blood cultures.  Some yeast in urine.    Past Medical History  Diagnosis Date  . Ulcerative colitis, unspecified   . Metabolic encephalopathy   . Unspecified intellectual disabilities   . Obesity, unspecified   . Hyperpotassemia   . Other convulsions   . Other lymphedema   . Unspecified constipation   . Other physical therapy   . Muscle weakness (generalized)   . Nephrolithiasis 01/12/2012  . Hydronephrosis 01/12/2012  . Healthcare-associated pneumonia 01/12/2012  . Intellectual disability   . Fungemia 06/25/2012  . Iron deficiency anemia 06/24/2012    Allergies:  Allergies  Allergen Reactions  . Other Other (See Comments)    No dairy products    Current antibiotics:   MEDICATIONS:    . albuterol  2.5 mg Nebulization Q6H  . antiseptic oral rinse  15 mL Mouth Rinse q12n4p  . cefTRIAXone (ROCEPHIN)  IV  2 g Intravenous Q24H  . chlorhexidine  15 mL Mouth Rinse BID  . enoxaparin (LOVENOX) injection  40 mg Subcutaneous Q24H  . micafungin (MYCAMINE) IV  100 mg Intravenous Daily  . PHENObarbital  130 mg Intravenous QHS  . phenytoin (DILANTIN) IV  100 mg Intravenous Q8H  . sodium chloride  10-40 mL Intracatheter Q12H    History  Substance Use Topics  . Smoking status: Never Smoker   . Smokeless tobacco: Never Used  . Alcohol Use: No    History reviewed. No pertinent family history.  Review of Systems - unobtainable from patient.  Family at bedside reports he appears more like his baseline.    OBJECTIVE: Temp:  [97.5 F (36.4 C)-99.1 F (37.3 C)] 99.1 F (37.3 C) (08/04 0555) Pulse Rate:  [100-112] 100  (08/04 0555) Resp:  [19-21] 19  (08/04 0555) BP: (112-125)/(80-87) 112/80 mmHg (08/04 0555) SpO2:  [92 %-95 %] 92 % (08/04  0931) General appearance: no distress Resp: clear to auscultation bilaterally GI: soft, non-tender; bowel sounds normal; no masses,  no organomegaly  LABS: Results for orders placed during the hospital encounter of 06/22/12 (from the past 48 hour(s))  BASIC METABOLIC PANEL     Status: Abnormal   Collection Time   06/26/12  6:34 AM      Component Value Range Comment   Sodium 141  135 - 145 mEq/L    Potassium 4.5  3.5 - 5.1 mEq/L SLIGHT HEMOLYSIS   Chloride 111  96 - 112 mEq/L    CO2 20  19 - 32 mEq/L    Glucose, Bld 112 (*) 70 - 99 mg/dL    BUN 8  6 - 23 mg/dL    Creatinine, Ser 4.09  0.50 - 1.35 mg/dL    Calcium 8.2 (*) 8.4 - 10.5 mg/dL    GFR calc non Af Amer >90  >90 mL/min    GFR calc Af Amer >90  >90 mL/min     MICRO:  IMAGING: No results found.  HISTORICAL MICRO/IMAGING  Assessment/Plan:    1) yeast - waiting for ID.  On micafungin.  If it is Candida albicans, could use po fluconazole, otherwise will need IV micafungin.  Repeat blood cultures have been cancelled for some reason.  Will reorder.  To get TTE.  Ophtho as outpatient.

## 2012-06-28 DIAGNOSIS — R7309 Other abnormal glucose: Secondary | ICD-10-CM

## 2012-06-28 DIAGNOSIS — E870 Hyperosmolality and hypernatremia: Secondary | ICD-10-CM

## 2012-06-28 LAB — BASIC METABOLIC PANEL
BUN: 7 mg/dL (ref 6–23)
CO2: 24 mEq/L (ref 19–32)
Calcium: 8.5 mg/dL (ref 8.4–10.5)
Creatinine, Ser: 0.65 mg/dL (ref 0.50–1.35)
GFR calc non Af Amer: >90 mL/min (ref >90)
Glucose, Bld: 111 mg/dL — ABNORMAL HIGH (ref 70–99)

## 2012-06-28 LAB — CULTURE, BLOOD (ROUTINE X 2)

## 2012-06-28 MED ORDER — ALBUTEROL SULFATE (5 MG/ML) 0.5% IN NEBU
2.5000 mg | INHALATION_SOLUTION | Freq: Two times a day (BID) | RESPIRATORY_TRACT | Status: DC
  Administered 2012-06-28 – 2012-06-30 (×4): 2.5 mg via RESPIRATORY_TRACT
  Filled 2012-06-28 (×4): qty 0.5

## 2012-06-28 NOTE — Progress Notes (Signed)
[facility] PROGRESS NOTE  Charles Humphrey ZOX:[id number] DOB: 01-08-1956 DOA: 06/22/2012 PCP: Kaleen Mask, MD  Assessment/Plan: Principal Problem:  *Sepsis Active Problems:  Healthcare-associated pneumonia  Epilepsy  Mental retardation  Nephrolithiasis  Hydronephrosis  Leukocytosis  Hypokalemia  UTI (lower urinary tract infection)  Iron deficiency anemia  Fungemia #1 sepsis  Likely secondary to fungemia from recent bladder instrumentation versus secondary to urinary tract infection. Patient's tachycardia is improving. Patient's white count is trending down. Patient with no fever overnight. Patient's temperature was as high as 102.7 4 days ago. Respiratory rate is improving. Blood cultures are positive for yeast one out of two. Urine cultures also growing yeast.. Sputum cultures pending. Pt has had 7 days of antibiotics and as such antibiotic regimen for presumed UTI has been discontinued. Will continue patient on IV micafungin while speciation of the organism is pending. Will need to discontinue PICC line and place peripheral IVs. Will defer length of treatment to ID. - Will f/u with ID's recommendations: They recommend placing Picc line once blood cultures negative for 72 hours. Also pending what type of yeast grows per ID therapy may change. Will follow closely   #2 Fungemia  - ID on board and we will f/u with their recommendations: Pt will need outpatient eye exam, TTE pending  - On Micafungin will plan on continuing for now.   #3 urinary tract infection  Urine cultures are growing yeast 55,000 colonies. Patient also with yeast in his blood stream. Patient has been started on IV micafungin. 7 days of antibiotics and therefore has been discontinued.  #4 questionable healthcare associated pneumonia  On admission and per family patient did have a productive cough and some congestion and also question of possible aspiration pneumonia. Doubt if patient has a  pneumonia. Patient has been seen by speech therapy and does not show any signs or symptoms of aspiration when being observed being fed.  Sputum cultures pending.   #5 status post right renal pelvic stone laser lithotripsy  Dr. Annabell Howells of urology is following and stent has been removed however Foley catheter will remain for now per urology. Will DC IV vancomycin. Patient's blood cultures is growing yeast. Will add IV micafungin to his regimen. Continue IV Rocephin for now. Urology is following and appreciate input and recommendations. OK to d/c off of foley once ready for discharge reportedly.   #6 hypokalemia  Repleted.   #7 severe iron deficiency anemia  H&H is stable. Per urology minimal blood loss. Patient does not have any gross GI bleed. Anemia panel consistent with severe iron deficiency anemia with iron level less than 10. Patient may benefit from IV and later stayed. May also benefit from iron supplements on discharge. Follow H&H.  - Start Ferrous sulfate  - Will check cbc for tomorrow.  #8 history of seizure/epilepsy  No seizures noted.  Will transition antiepileptic medication to oral today.   #9 mental retardation  Stable.   #10 prophylaxis  Lovenox for DVT prophylaxis.   Code Status: DO NOT RESUSCITATE  Family Communication: No family at bedside. Patient is nonverbal.  Disposition Plan: There continues to remain stable may consider transfer to telemetry tomorrow. Back to nursing home when medically stable   Brief narrative:  Charles Humphrey is a 56 yo wm, [facility] resident, admitted via [facility] for fever, pneumonia, and possible urosepsis, 1 day following R ureteroscopy and laser fragmentation of R renal pelvic stone, with placement of JJ stent and foley catheter. Stent and catheter ate to be  left for 48 hrs, to be removed together in the outpatient setting.  The patient underwent Left percutaneous nephrolithotomy for multiple large renal stones in March, 2013 ( aggregate >  2cm). At the time he was noted to have an asymptomatic 6mm R renal pelvic stone. However, he developed pain ( father's interpretation), and repeat evaluation showed stone size increased to 7mm, with R hydronephrosis at the [facility]. He failed conservative therapy, and underwent R ureteroscopy and laser lithotripsy per Dr. Annabell Howells on 06/22/12. JJ stent left in place, taped to a foley catheter. However , pt developed a fever, and lung congestion last night and was brought to [facility] by [facility] for care. He is admitted to [facility].  Patient has grown out yeast in urine and blood and is currently on IV micafungin. ID following and managing.  Consultants:  Urology: Dr. Patsi Sears 06/23/2012 being followed by Dr. Annabell Howells Procedures:  Chest x-ray 06/23/2012  Chest x-ray 06/24/2012 Antibiotics:  IV Maxipime 06/23/2012--->06/25/12  IV vancomycin 06/23/2012--> 06/25/2012  IV micafungin 06/25/2012  HPI/Subjective: Non verbal.  No acute issues reported overnight.  Objective: Filed Vitals:   06/27/12 2033 06/28/12 0500 06/28/12 0846 06/28/12 1336  BP:  121/77  132/74  Pulse:  99  100  Temp:  98.2 F (36.8 C)  98.3 F (36.8 C)  TempSrc:  Axillary  Axillary  Resp:  15  18  Height:      Weight:      SpO2: 94% 94% 96% 96%    Intake/Output Summary (Last 24 hours) at 06/28/12 1837 Last data filed at 06/28/12 1837  Gross per 24 hour  Intake    660 ml  Output   2525 ml  Net  -1865 ml    Exam:  General: Pt in NAD, Alert and Awake  Cardiovascular: RRR, No MRG  Respiratory: CTA BL, no wheezes or increased work of breathing  Abdomen: NT, ND  Data Reviewed: Basic Metabolic Panel:  Lab 06/28/12 4540 06/26/12 9811 06/25/12 0420 06/24/12 0916 06/24/12 0355 06/23/12 0014 06/23/12 0013  NA 136 141 144 -- 146* 144 --  K 3.7 4.5 3.8 -- 3.4* 3.3* --  CL 103 111 113* -- 115* 108 --  CO2 24 20 25  -- 24 27 --  GLUCOSE 111* 112* 133* -- 155* 149* --  BUN 7 8 10  -- 12 7 --    CREATININE 0.65 0.64 0.90 -- 1.06 0.58 --  CALCIUM 8.5 8.2* 8.3* -- 7.6* 8.5 --  MG -- -- -- 1.8 -- -- 1.8  PHOS -- -- -- -- -- -- --   Liver Function Tests:  Lab 06/24/12 0355 06/23/12 0013  AST 32 19  ALT 20 11  ALKPHOS 101 133*  BILITOT 0.2* 0.2*  PROT 5.4* 7.1  ALBUMIN 1.8* 2.8*   No results found for this basename: LIPASE:5,AMYLASE:5 in the last 168 hours No results found for this basename: AMMONIA:5 in the last 168 hours CBC:  Lab 06/25/12 0420 06/24/12 0355 06/23/12 0014 06/23/12 0013  WBC 7.4 7.3 14.6* 14.0*  NEUTROABS 5.5 6.5 -- --  HGB 11.3* 10.5* 14.0 14.6  HCT 35.1* 33.2* 42.5 45.5  MCV 95.6 96.8 96.2 98.9  PLT 228 218 244 248   Cardiac Enzymes:  Lab 06/23/12 0014  CKTOTAL --  CKMB --  CKMBINDEX --  TROPONINI <0.30   BNP (last 3 results) No results found for this basename: PROBNP:3 in the last 8760 hours CBG: No results found for this basename: GLUCAP:5 in the last  168 hours  Recent Results (from the past 240 hour(s))  CULTURE, BLOOD (ROUTINE X 2)     Status: Normal   Collection Time   06/23/12 12:13 AM      Component Value Range Status Comment   Specimen Description BLOOD RIGHT HAND   Final    Special Requests BOTTLES DRAWN AEROBIC ONLY   Final    Culture  Setup Time 06/23/2012 03:52   Final    Culture     Final    Value: CANDIDA LUSITANIAE     Note: Gram Stain Report Called to,Read Back By and Verified With: VELINA PERITIN @ 0045 ON 06/25/12 BY GOLLD   Report Status 06/28/2012 FINAL   Final   CULTURE, BLOOD (ROUTINE X 2)     Status: Normal (Preliminary result)   Collection Time   06/23/12  1:10 AM      Component Value Range Status Comment   Specimen Description BLOOD RIGHT   Final    Special Requests BOTTLES DRAWN AEROBIC ONLY 3CC   Final    Culture  Setup Time 06/23/2012 03:52   Final    Culture     Final    Value:        BLOOD CULTURE RECEIVED NO GROWTH TO DATE CULTURE WILL BE HELD FOR 5 DAYS BEFORE ISSUING A FINAL NEGATIVE REPORT    Report Status PENDING   Incomplete   URINE CULTURE     Status: Normal   Collection Time   06/23/12  4:26 AM      Component Value Range Status Comment   Specimen Description URINE, RANDOM   Final    Special Requests NONE   Final    Culture  Setup Time 06/23/2012 09:03   Final    Colony Count 55,000 COLONIES/ML   Final    Culture YEAST   Final    Report Status 06/24/2012 FINAL   Final   MRSA PCR SCREENING     Status: Normal   Collection Time   06/23/12  9:12 AM      Component Value Range Status Comment   MRSA by PCR NEGATIVE  NEGATIVE Final      Studies: Ct Abdomen Pelvis Wo Contrast  06/04/2012  *RADIOLOGY REPORT*  Clinical Data: History kidney stones.  Reevaluation  CT ABDOMEN AND PELVIS WITHOUT CONTRAST  Technique:  Multidetector CT imaging of the abdomen and pelvis was performed following the standard protocol without intravenous contrast.  Comparison: CT 05/21/2012  Findings:  Renal:  Non-IV contrast images demonstrate a partially obstructing calculus at the right ureteral pelvic junction measuring 9 mm not changed from prior.  There is mild hydronephrosis of the right which is similar to prior.  There is nonobstructing 5 mm calculus in the upper pole right kidney.  No distal right ureteral calculi. In the left kidney, there are three nonobstructing calculi ranging in size from 3 to  4 mm.  No obstructive uropathy on the left.  No left ureterolithiasis.  Lung bases are clear.  No pericardial fluid.  No focal hepatic lesion.  The gallbladder is collapsed.  The pancreas, spleen, adrenal gland are normal.  The stomach is gas filled similar to prior.  Small bowel and cecum are normal.  The rectum and sigmoid colon is smooth similar to prior with mild wall thickening.  Abdominal aorta normal caliber.  No retroperitoneal periportal lymphadenopathy.  No free fluid the pelvis.  The prostate gland is normal.  The bladder is mildly distended.  No evidence  of bladder stones. Review of  bone windows  demonstrates no aggressive osseous lesions.  IMPRESSION:  1.  Partially obstructing calculus at the right ureteral pelvic junction is not changed from prior. 2.  Bilateral nephrolithiasis.  3.   Smooth contour to the sigmoid colon and proximal rectum could represent chronic inflammation.  There is some retention of stool in the rectum.  Original Report Authenticated By: Genevive Bi, M.D.   Dg Chest 2 View  06/23/2012  *RADIOLOGY REPORT*  Clinical Data: Tachycardia, fever, shortness of breath.  CHEST - 2 VIEW  Comparison: 06/16/2012  Findings: Hypoaeration with interstitial vascular crowding. Prominent cardiomediastinal contours, similar to prior.  Central vascular congestion.  Mild lung base opacities.  Otherwise, no focal consolidation.  No pleural effusion or pneumothorax. Osteopenia.  IMPRESSION: Hypoaeration and mild bibasilar opacities; atelectasis versus infiltrate.  Prominent cardiomediastinal contours are similar to prior.  Original Report Authenticated By: Waneta Martins, M.D.   Dg Chest 2 View  06/16/2012  *RADIOLOGY REPORT*  Clinical Data: Fever.  Short of breath.  CHEST - 2 VIEW  Comparison: 02/05/2012  Findings: Lungs are markedly under aerated with bibasilar atelectasis.  Heart is upper normal in size allowing for low volumes.  There is persistent marked distention of the transverse colon and probably associated small bowel loops as previously seen. No obvious free intraperitoneal gas.  IMPRESSION: Bibasilar atelectasis.  Stable colonic distention.  Original Report Authenticated By: [name], M.D.   Dg Abd 1 View  06/17/2012  *RADIOLOGY REPORT*  Clinical Data: Right ureteral calculus and fever.  ABDOMEN - 1 VIEW  Comparison: CT of the abdomen dated 06/04/2012  Findings: Single image was obtained intraoperatively with a C-arm and saved.  This shows the superior aspect of a ureteral stent which extends up into the expected position of the central collecting system.  IMPRESSION:  Imaging during right ureteral stent placement.  Original Report Authenticated By: Reola Calkins, M.D.   US Renal  06/27/2012  *RADIOLOGY REPORT*  Clinical Data: Right ureteral stone extraction.  RENAL/URINARY TRACT ULTRASOUND COMPLETE  Comparison:  06/04/2012.  Findings:  Right Kidney:  11.1 cm.  Minimal pelvic fullness, and allowing for differences in technique, the pelvic distention is decreased compared to CT 06/04/2012.  Small residual renal collecting system calculi are present with posterior acoustic shadowing.  1 cm calculus is present in the right upper renal pole. This the previous of the visualized stent remains present, it is not seen.  Left Kidney:  11.7 cm.  No hydronephrosis.  Small nonobstructing renal calculi with posterior acoustic shadowing.  Bladder:  Decompressed with Foley catheter.  IMPRESSION: 1.  Nearly resolved right hydronephrosis.  1 cm nonobstructing right upper pole renal collecting system calculus remains. 2.  Small nonobstructing left renal collecting system calculi.  No left hydronephrosis. 3.  Decompressed urinary bladder with Foley catheter.  Original Report Authenticated By: Andreas Newport, M.D.   Dg Chest Port 1 View  06/24/2012  *RADIOLOGY REPORT*  Clinical Data: Evaluate bibasilar opacities  PORTABLE CHEST - 1 VIEW  Comparison: 06/23/2012; 06/16/2012  Findings: Grossly unchanged enlarged cardiac silhouette and mediastinal contours.  Interval placement of a right upper extremity approach PICC line with tip overlying the superior cavoatrial junction.  Lung volumes remain persistently reduced. Pulmonary vasculature is indistinct with cephalization of flow. Minimal increase in bilateral infrahilar heterogeneous opacities. No definite pleural effusion or pneumothorax.  Unchanged bones.  IMPRESSION: 1.  Right upper extremity approach PICC line tip overlies the superior cavoatrial junction. 2.  Minimal increase in bilateral infrahilar heterogeneous opacities, atelectasis versus  infiltrate.  Further evaluation with a PA and lateral chest radiograph may be obtained as clinically indicated. 3.  Suspect mild pulmonary edema.  Original Report Authenticated By: Waynard Reeds, M.D.    Scheduled Meds:   . albuterol  2.5 mg Nebulization BID  . antiseptic oral rinse  15 mL Mouth Rinse q12n4p  . chlorhexidine  15 mL Mouth Rinse BID  . enoxaparin (LOVENOX) injection  40 mg Subcutaneous Q24H  . ferrous sulfate  325 mg Oral BID WC  . micafungin (MYCAMINE) IV  100 mg Intravenous Daily  . PHENObarbital  130 mg Intravenous Once  . PHENobarbital  162 mg Oral QHS  . phenytoin  175 mg Oral BID  . phenytoin (DILANTIN) IV  100 mg Intravenous Once  . sodium chloride  10-40 mL Intracatheter Q12H  . DISCONTD: albuterol  2.5 mg Nebulization Q6H  . DISCONTD: cefTRIAXone (ROCEPHIN)  IV  2 g Intravenous Q24H   Continuous Infusions:   . sodium chloride 0.45 % with kcl 75 mL/hr at 06/28/12 1430    Principal Problem:  *Sepsis Active Problems:  Healthcare-associated pneumonia  Epilepsy  Mental retardation  Nephrolithiasis  Hydronephrosis  Leukocytosis  Hypokalemia  UTI (lower urinary tract infection)  Iron deficiency anemia  Fungemia    Time spent: > 30 minutes    Penny Pia  [facility] Pager [phone]. If 8PM-8AM, please contact night-coverage at [email or web address], password [facility] 06/28/2012, 6:37 PM  LOS: 6 days

## 2012-06-28 NOTE — Progress Notes (Signed)
Speech Language Pathology Dysphagia Treatment Patient Details Name: Charles Humphrey MRN: [id number] DOB: Sep 10, 1956 Today's Date: 06/28/2012 Time: 2130-8657 SLP Time Calculation (min): 30 min  Assessment / Plan / Recommendation Clinical Impression  Much improved acceptance of po noted today, pt given one portion of graham cracker, full cheese stick, 3 ounces applesauce, 4 ounces apple and 4 ounces cranberry juice.  Lingual thrusting continues but pt accepting po and masticating solids.  Pt observed to masticate and NOT SWALLOW but opens mouth to accept more po (oral pocketing on right noted).  Providing a tsp of applesauce facilitated oral clearance.   Recommend to consider advancing to mech soft/pureed meats with strict precautions.  SLP educated RN to recommendations and wrote new precautions/compensation signs.    Also SLP set up oral suction for use after intake as pt bit down on toothette when slp cleaned mouth which may place him at risk of aspirating sponge/toothette.      Diet Recommendation  Initiate / Change Diet: Dysphagia 3 (mechanical soft);Thin liquid (pureed meats)    SLP Plan Continue with current plan of care   Pertinent Vitals/Pain Afebrile, decreased    Swallowing Goals  SLP Swallowing Goals Swallow Study Goal #2 - Progress: Progressing toward goal  General Temperature Spikes Noted: No Respiratory Status: Room air Behavior/Cognition: Alert;Cooperative;Other (comment) (pt with baseline cognitive deficits) Oral Cavity - Dentition: Adequate natural dentition Patient Positioning: Upright in bed  Oral Cavity - Oral Hygiene   Pt allowed SLP to use toothette *though he bit down on it* and oral suction (that slp set up) for oral care after snack.   Dysphagia Treatment Treatment focused on: Skilled observation of diet tolerance Family/Caregiver Educated: family not present Treatment Methods/Modalities: Skilled observation Patient observed directly with PO's:  Yes Type of PO's observed: Dysphagia 3 (soft);Thin liquids Feeding: Total assist Liquids provided via: Straw Oral Phase Signs & Symptoms: Right pocketing (applesauce aided clearance)   GO     Charles Burnet, MS Charles Humphrey SLP [phone]

## 2012-06-28 NOTE — Progress Notes (Signed)
Echocardiogram 2D Echocardiogram has been performed.  Charles Humphrey 06/28/2012, 1:51 PM

## 2012-06-28 NOTE — Progress Notes (Addendum)
Infectious Diseases Initial Consultation  Reason for Consultation:  Management of yeast in blood   HPI: Charles Humphrey is a 56 y.o. male with hx of MR, [facility] resident came in with fever and UTI after lithotripsy.  Noted to have yeast in 1/2 blood cultures.  Some yeast in urine.    Past Medical History  Diagnosis Date  . Ulcerative colitis, unspecified   . Metabolic encephalopathy   . Unspecified intellectual disabilities   . Obesity, unspecified   . Hyperpotassemia   . Other convulsions   . Other lymphedema   . Unspecified constipation   . Other physical therapy   . Muscle weakness (generalized)   . Nephrolithiasis 01/12/2012  . Hydronephrosis 01/12/2012  . Healthcare-associated pneumonia 01/12/2012  . Intellectual disability   . Fungemia 06/25/2012  . Iron deficiency anemia 06/24/2012    Allergies:  Allergies  Allergen Reactions  . Other Other (See Comments)    No dairy products    Current antibiotics:   MEDICATIONS:    . albuterol  2.5 mg Nebulization BID  . antiseptic oral rinse  15 mL Mouth Rinse q12n4p  . cefTRIAXone (ROCEPHIN)  IV  2 g Intravenous Q24H  . chlorhexidine  15 mL Mouth Rinse BID  . enoxaparin (LOVENOX) injection  40 mg Subcutaneous Q24H  . ferrous sulfate  325 mg Oral BID WC  . micafungin (MYCAMINE) IV  100 mg Intravenous Daily  . PHENObarbital  130 mg Intravenous Once  . PHENobarbital  162 mg Oral QHS  . phenytoin  175 mg Oral BID  . phenytoin (DILANTIN) IV  100 mg Intravenous Once  . sodium chloride  10-40 mL Intracatheter Q12H  . DISCONTD: albuterol  2.5 mg Nebulization Q6H  . DISCONTD: PHENObarbital  130 mg Intravenous QHS  . DISCONTD: phenytoin (DILANTIN) IV  100 mg Intravenous Q8H    History  Substance Use Topics  . Smoking status: Never Smoker   . Smokeless tobacco: Never Used  . Alcohol Use: No    History reviewed. No pertinent family history.  Review of Systems - unobtainable from patient.  Family at bedside reports he appears  more like his baseline.    OBJECTIVE: Temp:  [98.2 F (36.8 C)-98.3 F (36.8 C)] 98.3 F (36.8 C) (08/05 1336) Pulse Rate:  [99-100] 100  (08/05 1336) Resp:  [15-18] 18  (08/05 1336) BP: (121-132)/(74-77) 132/74 mmHg (08/05 1336) SpO2:  [94 %-96 %] 96 % (08/05 1336) FiO2 (%):  [21 %] 21 % (08/05 0846) General appearance: no distress Resp: clear to auscultation bilaterally GI: soft, non-tender; bowel sounds normal; no masses,  no organomegaly  LABS: Results for orders placed during the hospital encounter of 06/22/12 (from the past 48 hour(s))  BASIC METABOLIC PANEL     Status: Abnormal   Collection Time   06/28/12  2:10 AM      Component Value Range Comment   Sodium 136  135 - 145 mEq/L    Potassium 3.7  3.5 - 5.1 mEq/L    Chloride 103  96 - 112 mEq/L    CO2 24  19 - 32 mEq/L    Glucose, Bld 111 (*) 70 - 99 mg/dL    BUN 7  6 - 23 mg/dL    Creatinine, Ser 9.62  0.50 - 1.35 mg/dL    Calcium 8.5  8.4 - 95.2 mg/dL    GFR calc non Af Amer >90  >90 mL/min    GFR calc Af Amer >90  >90 mL/min  MICRO:  IMAGING: US Renal  06/27/2012  *RADIOLOGY REPORT*  Clinical Data: Right ureteral stone extraction.  RENAL/URINARY TRACT ULTRASOUND COMPLETE  Comparison:  06/04/2012.  Findings:  Right Kidney:  11.1 cm.  Minimal pelvic fullness, and allowing for differences in technique, the pelvic distention is decreased compared to CT 06/04/2012.  Small residual renal collecting system calculi are present with posterior acoustic shadowing.  1 cm calculus is present in the right upper renal pole. This the previous of the visualized stent remains present, it is not seen.  Left Kidney:  11.7 cm.  No hydronephrosis.  Small nonobstructing renal calculi with posterior acoustic shadowing.  Bladder:  Decompressed with Foley catheter.  IMPRESSION: 1.  Nearly resolved right hydronephrosis.  1 cm nonobstructing right upper pole renal collecting system calculus remains. 2.  Small nonobstructing left renal collecting  system calculi.  No left hydronephrosis. 3.  Decompressed urinary bladder with Foley catheter.  Original Report Authenticated By: Andreas Newport, M.D.    HISTORICAL MICRO/IMAGING  Assessment/Plan:    1) yeast - waiting for ID.  On micafungin.  If it is Candida albicans, could use po fluconazole, otherwise will need IV micafungin.  Repeat blood cultures have just been done.  Ophtho as outpatient. TTE done and report pending.   2) UTI - negative culture.  On ceftriaxone day 4, antibiotics day 7.  Has completed the course.

## 2012-06-29 LAB — CBC
MCH: 31.6 pg (ref 26.0–34.0)
MCHC: 33.5 g/dL (ref 30.0–36.0)
MCV: 94.2 fL (ref 78.0–100.0)
Platelets: 318 10*3/uL (ref 150–400)
RDW: 16.7 % — ABNORMAL HIGH (ref 11.5–15.5)

## 2012-06-29 LAB — CULTURE, BLOOD (ROUTINE X 2): Culture: NO GROWTH

## 2012-06-29 MED ORDER — FLUCONAZOLE 200 MG PO TABS
400.0000 mg | ORAL_TABLET | Freq: Every day | ORAL | Status: DC
  Administered 2012-06-30: 400 mg via ORAL
  Filled 2012-06-29: qty 2

## 2012-06-29 MED ORDER — FLUCONAZOLE 200 MG PO TABS
800.0000 mg | ORAL_TABLET | Freq: Once | ORAL | Status: AC
  Administered 2012-06-29: 800 mg via ORAL
  Filled 2012-06-29: qty 4

## 2012-06-29 NOTE — Progress Notes (Signed)
Speech Language Pathology Dysphagia Treatment Patient Details Name: Charles Humphrey MRN: [id number] DOB: 09-25-1956 Today's Date: 06/29/2012 Time: 9147-8295 SLP Time Calculation (min): 26 min  Assessment / Plan / Recommendation Clinical Impression  Per discussion with nurse technician, pt consumed all of this breakfast today, he is currently declining lunch.  Pt is afebrile with lungs decreased.  SLP obseved him swallow 1/4 tsp of ensure with delayed swallow and no clinical s/s of aspiration.  Pt would not accept cracker, soup, or ensure via straw.  Pt sealed lips closed and turned his head.  Mental status appears consistent with yesterdays' findings and therefore rec advance to allow soft solids with strict compliance to precautions :  feeding pt only when he accepts.  Family not present to educate.  Suspect pt's swallow is near baseline, but family not present to inquire.  SLP to follow up briefly to assure tolerance.     Diet Recommendation  Initiate / Change Diet: Dysphagia 3 (mechanical soft);Thin liquid (pureed meats)    SLP Plan Continue with current plan of care   Pertinent Vitals/Pain Afebrile, decr   Swallowing Goals  SLP Swallowing Goals Swallow Study Goal #2 - Progress: Progressing toward goal  General Temperature Spikes Noted: No Respiratory Status: Room air Behavior/Cognition: Alert;Uncooperative;Doesn't follow directions Oral Cavity - Dentition: Adequate natural dentition Patient Positioning: Upright in bed  Oral Cavity - Oral Hygiene   pt would not open mouth to allow slp to inspect oral cavity  Dysphagia Treatment Treatment focused on: Skilled observation of diet tolerance Family/Caregiver Educated: family not present Treatment Methods/Modalities: Skilled observation Patient observed directly with PO's: No Type of PO's observed:  (pt only accepted tsp of liquid - closed mouth & turned away ) Reason PO's not observed: Refused Feeding: Total assist;Refused  PO Liquids provided via: Teaspoon Pharyngeal Phase Signs & Symptoms: Suspected delayed swallow initiation Amount of cueing: Total (pt does not follow commands)   GO     [name], MS [facility] SLP [phone]

## 2012-06-29 NOTE — Progress Notes (Signed)
Chart review.  Spoke with MD.  Per MD, Pt not medically ready to d/c today; Pt may be ready tomorrow.  CSW to continue to follow.  Providence Crosby, LCSWA Clinical Social Work [phone]

## 2012-06-29 NOTE — Progress Notes (Signed)
Antimicrobial CONSULT NOTE  Pharmacy Consult for Fluconazole Indication: Fungemia  Allergies  Allergen Reactions  . Other Other (See Comments)    No dairy products    Patient Measurements: Height: 5' 6.14" (168 cm) Weight: 179 lb 0.2 oz (81.2 kg) IBW/kg (Calculated) : 64.13   Vital Signs: Temp: 98.3 F (36.8 C) (08/06 0620) Temp src: Axillary (08/06 0620) BP: 121/78 mmHg (08/06 0620) Pulse Rate: 113  (08/06 0620) Intake/Output from previous day: 08/05 0701 - 08/06 0700 In: 360 [P.O.:360] Out: 3300 [Urine:3300]  Labs:  Basename 06/29/12 0430 06/28/12 0210  WBC 6.9 --  HGB 11.5* --  PLT 318 --  LABCREA -- --  CREATININE -- 0.65   Estimated Creatinine Clearance: 107.1 ml/min (by C-G formula based on Cr of 0.65).  Microbiology: Recent Results (from the past 720 hour(s))  CULTURE, BLOOD (ROUTINE X 2)     Status: Normal   Collection Time   06/16/12  7:55 PM      Component Value Range Status Comment   Specimen Description BLOOD RIGHT HAND   Final    Special Requests BOTTLES DRAWN AEROBIC ONLY 3CC   Final    Culture  Setup Time 06/17/2012 03:32   Final    Culture NO GROWTH 5 DAYS   Final    Report Status 06/23/2012 FINAL   Final   CULTURE, BLOOD (ROUTINE X 2)     Status: Normal   Collection Time   06/16/12  8:00 PM      Component Value Range Status Comment   Specimen Description BLOOD LEFT HAND   Final    Special Requests BOTTLES DRAWN AEROBIC ONLY 3CC   Final    Culture  Setup Time 06/17/2012 03:32   Final    Culture NO GROWTH 5 DAYS   Final    Report Status 06/23/2012 FINAL   Final   URINE CULTURE     Status: Normal   Collection Time   06/16/12  8:08 PM      Component Value Range Status Comment   Specimen Description URINE, CATHETERIZED   Final    Special Requests NONE   Final    Culture  Setup Time 06/17/2012 03:24   Final    Colony Count NO GROWTH   Final    Culture NO GROWTH   Final    Report Status 06/17/2012 FINAL   Final   MRSA PCR SCREENING      Status: Normal   Collection Time   06/17/12 10:30 AM      Component Value Range Status Comment   MRSA by PCR NEGATIVE  NEGATIVE Final   CULTURE, BLOOD (ROUTINE X 2)     Status: Normal   Collection Time   06/23/12 12:13 AM      Component Value Range Status Comment   Specimen Description BLOOD RIGHT HAND   Final    Special Requests BOTTLES DRAWN AEROBIC ONLY   Final    Culture  Setup Time 06/23/2012 03:52   Final    Culture     Final    Value: CANDIDA LUSITANIAE     Note: Gram Stain Report Called to,Read Back By and Verified With: VELINA PERITIN @ 0045 ON 06/25/12 BY GOLLD   Report Status 06/28/2012 FINAL   Final   CULTURE, BLOOD (ROUTINE X 2)     Status: Normal   Collection Time   06/23/12  1:10 AM      Component Value Range Status Comment   Specimen Description BLOOD  RIGHT   Final    Special Requests BOTTLES DRAWN AEROBIC ONLY 3CC   Final    Culture  Setup Time 06/23/2012 03:52   Final    Culture NO GROWTH 5 DAYS   Final    Report Status 06/29/2012 FINAL   Final   URINE CULTURE     Status: Normal   Collection Time   06/23/12  4:26 AM      Component Value Range Status Comment   Specimen Description URINE, RANDOM   Final    Special Requests NONE   Final    Culture  Setup Time 06/23/2012 09:03   Final    Colony Count 55,000 COLONIES/ML   Final    Culture YEAST   Final    Report Status 06/24/2012 FINAL   Final   MRSA PCR SCREENING     Status: Normal   Collection Time   06/23/12  9:12 AM      Component Value Range Status Comment   MRSA by PCR NEGATIVE  NEGATIVE Final   CULTURE, BLOOD (ROUTINE X 2)     Status: Normal (Preliminary result)   Collection Time   06/28/12  2:10 AM      Component Value Range Status Comment   Specimen Description BLOOD L HAND   Final    Special Requests BOTTLES DRAWN AEROBIC ONLY   Final    Culture  Setup Time 06/28/2012 09:16   Final    Culture     Final    Value:        BLOOD CULTURE RECEIVED NO GROWTH TO DATE CULTURE WILL BE HELD FOR 5 DAYS BEFORE  ISSUING A FINAL NEGATIVE REPORT   Report Status PENDING   Incomplete   CULTURE, BLOOD (ROUTINE X 2)     Status: Normal (Preliminary result)   Collection Time   06/28/12  2:16 AM      Component Value Range Status Comment   Specimen Description BLOOD L HAND   Final    Special Requests BOTTLES DRAWN AEROBIC ONLY   Final    Culture  Setup Time 06/28/2012 09:16   Final    Culture     Final    Value:        BLOOD CULTURE RECEIVED NO GROWTH TO DATE CULTURE WILL BE HELD FOR 5 DAYS BEFORE ISSUING A FINAL NEGATIVE REPORT   Report Status PENDING   Incomplete      Medications:  Anti-infectives     Start     Dose/Rate Route Frequency Ordered Stop   06/25/12 1200   fluconazole (DIFLUCAN) IVPB 400 mg  Status:  Discontinued        400 mg 200 mL/hr over 60 Minutes Intravenous Every 24 hours 06/25/12 0900 06/25/12 0925   06/25/12 1200   cefTRIAXone (ROCEPHIN) 2 g in dextrose 5 % 50 mL IVPB  Status:  Discontinued        2 g 100 mL/hr over 30 Minutes Intravenous Every 24 hours 06/25/12 1057 06/28/12 1510   06/25/12 1100   micafungin (MYCAMINE) 100 mg in sodium chloride 0.9 % 100 mL IVPB  Status:  Discontinued        100 mg 100 mL/hr over 1 Hours Intravenous Daily 06/25/12 0926 06/29/12 1330   06/25/12 0130   fluconazole (DIFLUCAN) IVPB 200 mg  Status:  Discontinued        200 mg 100 mL/hr over 60 Minutes Intravenous Every 24 hours 06/25/12 0126 06/25/12 0138   06/24/12 1400  fluconazole (DIFLUCAN) IVPB 200 mg  Status:  Discontinued        200 mg 100 mL/hr over 60 Minutes Intravenous Every 24 hours 06/24/12 1320 06/25/12 0859   06/23/12 1000   vancomycin (VANCOCIN) IVPB 1000 mg/200 mL premix  Status:  Discontinued        1,000 mg 200 mL/hr over 60 Minutes Intravenous Every 8 hours 06/23/12 0917 06/25/12 0838   06/23/12 0930   ceFEPIme (MAXIPIME) 1 g in dextrose 5 % 50 mL IVPB  Status:  Discontinued        1 g 100 mL/hr over 30 Minutes Intravenous 3 times per day 06/23/12 0840 06/25/12 1005    06/23/12 0930   levofloxacin (LEVAQUIN) IVPB 750 mg  Status:  Discontinued        750 mg 100 mL/hr over 90 Minutes Intravenous Every 24 hours 06/23/12 0840 06/25/12 1057   06/23/12 0230   vancomycin (VANCOCIN) IVPB 1000 mg/200 mL premix  Status:  Discontinued        1,000 mg 200 mL/hr over 60 Minutes Intravenous  Once 06/23/12 0225 06/24/12 1906   06/23/12 0230  piperacillin-tazobactam (ZOSYN) IVPB 3.375 g       3.375 g 100 mL/hr over 30 Minutes Intravenous  Once 06/23/12 0225 06/23/12 0334         Assessment:  55 YOM with yeast in blood and urine, Candida Lusitaniae  Per ID consult, change from micafungin to fluconazole.  Will continue fluconazole for 14 days from negative blood culture.   Plan:   Fluconazole 800mg  PO x1 loading dose  Fluconazole 400mg  PO daily  F/U cultures and appropriate stop date.   Lynann Beaver PharmD, BCPS Pager [phone] 06/29/2012 2:09 PM

## 2012-06-29 NOTE — Progress Notes (Signed)
MEDICATION RELATED CONSULT NOTE  Pharmacy Consult for Phenytoin Indication: History of seizures/epilepsy  Allergies  Allergen Reactions  . Other Other (See Comments)    No dairy products    Patient Measurements: Height: 5' 6.14" (168 cm) Weight: 179 lb 0.2 oz (81.2 kg) IBW/kg (Calculated) : 64.13    Labs:  Basename 06/29/12 0430 06/28/12 0210  WBC 6.9 --  HGB 11.5* --  HCT 34.3* --  PLT 318 --  APTT -- --  CREATININE -- 0.65  LABCREA -- --  CREATININE -- 0.65  CREAT24HRUR -- --  MG -- --  PHOS -- --  ALBUMIN -- --  PROT -- --  ALBUMIN -- --  AST -- --  ALT -- --  ALKPHOS -- --  BILITOT -- --  BILIDIR -- --  IBILI -- --   Estimated Creatinine Clearance: 107.1 ml/min (by C-G formula based on Cr of 0.65).    Medications:  Scheduled:    . albuterol  2.5 mg Nebulization BID  . antiseptic oral rinse  15 mL Mouth Rinse q12n4p  . chlorhexidine  15 mL Mouth Rinse BID  . enoxaparin (LOVENOX) injection  40 mg Subcutaneous Q24H  . ferrous sulfate  325 mg Oral BID WC  . fluconazole  400 mg Oral Daily  . fluconazole  800 mg Oral Once  . PHENobarbital  162 mg Oral QHS  . phenytoin  175 mg Oral BID  . sodium chloride  10-40 mL Intracatheter Q12H  . DISCONTD: cefTRIAXone (ROCEPHIN)  IV  2 g Intravenous Q24H  . DISCONTD: micafungin (MYCAMINE) IV  100 mg Intravenous Daily    Assessment:  56 yo M with history of seizures on phenobarbitol 160 mg QHS and dilantin suspension 175 mg PO BID PTA at nursing home.  06/23/12 Phenytoin level = 13.4; Corrected phenytoin level  = 20.3 (Albumin 2.8, CrCl > 100 ml/min)  PO changed to IV phenytoin 100mg  IV q8h on 06/23/12 as patient was unable to take PO.    Phenytoin and phenobarbital changed back to PO at PTA dosage on 06/27/12, but pt has been refusing some PO doses.  Noted drug-drug interaction with fluconazole (started 8/6) which will increase phenytoin levels.  Goal of Therapy:  Phenytoin Level 10.0 - 20.0 ug/mL  Plan:    Continue PTA dosage of Phenytoin 175mg  PO q12h  Encourage PO administration of RN giving meds  Obtain new level and adjust doses as needed for drug interactions.  Lynann Beaver PharmD, BCPS Pager [phone] 06/29/2012 2:49 PM

## 2012-06-29 NOTE — Progress Notes (Signed)
His renal US shows resolving right hydro.  There is a 1cm RUP stone but I fragmented and removed the stone that was previously at the [facility].   I didn't see the mentioned stone on recent ureteroscopy and it may be submucosal.     He will need f/u with me in about 3 months for a renal US.

## 2012-06-29 NOTE — Progress Notes (Signed)
[facility] PROGRESS NOTE  Charles Humphrey AOZ:[id number] DOB: 09/07/1956 DOA: 06/22/2012 PCP: Kaleen Mask, MD  Assessment/Plan: Principal Problem:  *Sepsis Active Problems:  Healthcare-associated pneumonia  Epilepsy  Mental retardation  Nephrolithiasis  Hydronephrosis  Leukocytosis  Hypokalemia  UTI (lower urinary tract infection)  Iron deficiency anemia  Fungemia  1 sepsis  Likely secondary to fungemia from recent bladder instrumentation versus secondary to urinary tract infection. Patient's tachycardia is improving. Patient's white count is trending down. Patient with no fever overnight. Patient's temperature was as high as 102.7 4 days ago. Respiratory rate is improving. Blood cultures are positive for yeast one out of two. Urine cultures also growing yeast.. Sputum cultures pending. Pt has had 7 days of antibiotics and as such antibiotic regimen for presumed UTI has been discontinued. Will continue patient on IV micafungin while speciation of the organism is pending. Will need to discontinue PICC line and place peripheral IVs. Will defer length of treatment to ID.  - Will f/u with ID's recommendations: They recommend placing Picc line once blood cultures negative for 72 hours.   #2 Fungemia  - ID on board and we will f/u with their recommendations: Pt will need outpatient eye exam -  Switched to po fluconazole today Should get treatment until 8/18 as outlined by infectious disease.  - growing Candida lusitaniae.  #3 urinary tract infection  Urine cultures are growing yeast 55,000 colonies. Patient also with yeast in his blood stream. Patient has been started on IV micafungin and now on oral fluconazole (micafungin discontinued). 7 days of antibiotics and therefore has been discontinued.   #4 questionable healthcare associated pneumonia  On admission and per family patient did have a productive cough and some congestion and also question of possible aspiration  pneumonia. Doubt if patient has a pneumonia. Patient has been seen by speech therapy and does not show any signs or symptoms of aspiration when being observed being fed.  Sputum cultures pending.  - Per speech therapy placed patient on Mechanical soft diet with pureed meats.  #5 status post right renal pelvic stone laser lithotripsy  Dr. Annabell Howells of urology is following and stent has been removed however Foley catheter will remain for now per urology. Will DC IV vancomycin. Patient's blood cultures is growing yeast. Will add IV micafungin to his regimen.  Urology is following and appreciate input and recommendations. OK to d/c off of foley once ready for discharge reportedly.   #6 hypokalemia  Repleted. And resolved  #7 severe iron deficiency anemia  H&H is stable. Per urology minimal blood loss. Patient does not have any gross GI bleed. Anemia panel consistent with severe iron deficiency anemia with iron level less than 10. Patient may benefit from IV and later stayed. May also benefit from iron supplements on discharge. Follow H&H.  - Start Ferrous sulfate   #8 history of seizure/epilepsy  No seizures noted.  Transitioned to oral antiepileptic medication and tolerating well.    #9 mental retardation  Stable.   #10 prophylaxis  Lovenox for DVT prophylaxis.   Code Status: DO NOT RESUSCITATE  Family Communication: No family at bedside. Patient is nonverbal.  Disposition Plan: There continues to remain stable may consider transfer to telemetry tomorrow. Back to nursing home when medically stable   Brief narrative:  Charles Humphrey is a 56 yo wm, [facility] resident, admitted via [facility] for fever, pneumonia, and possible urosepsis, 1 day following R ureteroscopy and laser fragmentation of R renal pelvic stone, with placement of JJ  stent and foley catheter. Stent and catheter ate to be left for 48 hrs, to be removed together in the outpatient setting.  The patient underwent Left percutaneous  nephrolithotomy for multiple large renal stones in March, 2013 ( aggregate > 2cm). At the time he was noted to have an asymptomatic 6mm R renal pelvic stone. However, he developed pain ( father's interpretation), and repeat evaluation showed stone size increased to 7mm, with R hydronephrosis at the [facility]. He failed conservative therapy, and underwent R ureteroscopy and laser lithotripsy per Dr. Annabell Howells on 06/22/12. JJ stent left in place, taped to a foley catheter. However , pt developed a fever, and lung congestion last night and was brought to [facility] by [facility] for care. He is admitted to [facility].  Patient has grown out yeast in urine and blood and is currently on IV micafungin. ID following and managing.  Consultants:  Urology: Dr. Patsi Sears 06/23/2012 being followed by Dr. Annabell Howells Procedures:  Chest x-ray 06/23/2012  Chest x-ray 06/24/2012 Antibiotics:  IV Maxipime 06/23/2012--->06/25/12  IV vancomycin 06/23/2012--> 06/25/2012  IV micafungin 06/25/2012  HPI/Subjective: None verbal.  No acute issues reported overnight.  Objective: Filed Vitals:   06/28/12 2210 06/29/12 0620 06/29/12 0822 06/29/12 1506  BP: 124/76 121/78  132/88  Pulse: 115 113  87  Temp: 99.3 F (37.4 C) 98.3 F (36.8 C)  98 F (36.7 C)  TempSrc: Axillary Axillary  Axillary  Resp: 20 20  20   Height:      Weight:      SpO2: 93% 95% 94% 94%    Intake/Output Summary (Last 24 hours) at 06/29/12 1918 Last data filed at 06/29/12 1648  Gross per 24 hour  Intake    480 ml  Output    900 ml  Net   -420 ml    Exam:  General: Pt in NAD, Alert and Awake  Cardiovascular: RRR, No MRG  Respiratory: CTA BL, no wheezes or increased work of breathing  Abdomen: NT, ND  Data Reviewed: Basic Metabolic Panel:  Lab 06/28/12 2956 06/26/12 2130 06/25/12 0420 06/24/12 0916 06/24/12 0355 06/23/12 0014 06/23/12 0013  NA 136 141 144 -- 146* 144 --  K 3.7 4.5 3.8 -- 3.4* 3.3* --  CL 103 111 113* -- 115*  108 --  CO2 24 20 25  -- 24 27 --  GLUCOSE 111* 112* 133* -- 155* 149* --  BUN 7 8 10  -- 12 7 --  CREATININE 0.65 0.64 0.90 -- 1.06 0.58 --  CALCIUM 8.5 8.2* 8.3* -- 7.6* 8.5 --  MG -- -- -- 1.8 -- -- 1.8  PHOS -- -- -- -- -- -- --   Liver Function Tests:  Lab 06/24/12 0355 06/23/12 0013  AST 32 19  ALT 20 11  ALKPHOS 101 133*  BILITOT 0.2* 0.2*  PROT 5.4* 7.1  ALBUMIN 1.8* 2.8*   No results found for this basename: LIPASE:5,AMYLASE:5 in the last 168 hours No results found for this basename: AMMONIA:5 in the last 168 hours CBC:  Lab 06/29/12 0430 06/25/12 0420 06/24/12 0355 06/23/12 0014 06/23/12 0013  WBC 6.9 7.4 7.3 14.6* 14.0*  NEUTROABS -- 5.5 6.5 -- --  HGB 11.5* 11.3* 10.5* 14.0 14.6  HCT 34.3* 35.1* 33.2* 42.5 45.5  MCV 94.2 95.6 96.8 96.2 98.9  PLT 318 228 218 244 248   Cardiac Enzymes:  Lab 06/23/12 0014  CKTOTAL --  CKMB --  CKMBINDEX --  TROPONINI <0.30   BNP (last 3 results) No  results found for this basename: PROBNP:3 in the last 8760 hours CBG: No results found for this basename: GLUCAP:5 in the last 168 hours  Recent Results (from the past 240 hour(s))  CULTURE, BLOOD (ROUTINE X 2)     Status: Normal   Collection Time   06/23/12 12:13 AM      Component Value Range Status Comment   Specimen Description BLOOD RIGHT HAND   Final    Special Requests BOTTLES DRAWN AEROBIC ONLY   Final    Culture  Setup Time 06/23/2012 03:52   Final    Culture     Final    Value: CANDIDA LUSITANIAE     Note: Gram Stain Report Called to,Read Back By and Verified With: VELINA PERITIN @ 0045 ON 06/25/12 BY GOLLD   Report Status 06/28/2012 FINAL   Final   CULTURE, BLOOD (ROUTINE X 2)     Status: Normal   Collection Time   06/23/12  1:10 AM      Component Value Range Status Comment   Specimen Description BLOOD RIGHT   Final    Special Requests BOTTLES DRAWN AEROBIC ONLY 3CC   Final    Culture  Setup Time 06/23/2012 03:52   Final    Culture NO GROWTH 5 DAYS   Final     Report Status 06/29/2012 FINAL   Final   URINE CULTURE     Status: Normal   Collection Time   06/23/12  4:26 AM      Component Value Range Status Comment   Specimen Description URINE, RANDOM   Final    Special Requests NONE   Final    Culture  Setup Time 06/23/2012 09:03   Final    Colony Count 55,000 COLONIES/ML   Final    Culture YEAST   Final    Report Status 06/24/2012 FINAL   Final   MRSA PCR SCREENING     Status: Normal   Collection Time   06/23/12  9:12 AM      Component Value Range Status Comment   MRSA by PCR NEGATIVE  NEGATIVE Final   CULTURE, BLOOD (ROUTINE X 2)     Status: Normal (Preliminary result)   Collection Time   06/28/12  2:10 AM      Component Value Range Status Comment   Specimen Description BLOOD L HAND   Final    Special Requests BOTTLES DRAWN AEROBIC ONLY   Final    Culture  Setup Time 06/28/2012 09:16   Final    Culture     Final    Value:        BLOOD CULTURE RECEIVED NO GROWTH TO DATE CULTURE WILL BE HELD FOR 5 DAYS BEFORE ISSUING A FINAL NEGATIVE REPORT   Report Status PENDING   Incomplete   CULTURE, BLOOD (ROUTINE X 2)     Status: Normal (Preliminary result)   Collection Time   06/28/12  2:16 AM      Component Value Range Status Comment   Specimen Description BLOOD L HAND   Final    Special Requests BOTTLES DRAWN AEROBIC ONLY   Final    Culture  Setup Time 06/28/2012 09:16   Final    Culture     Final    Value:        BLOOD CULTURE RECEIVED NO GROWTH TO DATE CULTURE WILL BE HELD FOR 5 DAYS BEFORE ISSUING A FINAL NEGATIVE REPORT   Report Status PENDING   Incomplete  Studies: Ct Abdomen Pelvis Wo Contrast  06/04/2012  *RADIOLOGY REPORT*  Clinical Data: History kidney stones.  Reevaluation  CT ABDOMEN AND PELVIS WITHOUT CONTRAST  Technique:  Multidetector CT imaging of the abdomen and pelvis was performed following the standard protocol without intravenous contrast.  Comparison: CT 05/21/2012  Findings:  Renal:  Non-IV contrast images  demonstrate a partially obstructing calculus at the right ureteral pelvic junction measuring 9 mm not changed from prior.  There is mild hydronephrosis of the right which is similar to prior.  There is nonobstructing 5 mm calculus in the upper pole right kidney.  No distal right ureteral calculi. In the left kidney, there are three nonobstructing calculi ranging in size from 3 to  4 mm.  No obstructive uropathy on the left.  No left ureterolithiasis.  Lung bases are clear.  No pericardial fluid.  No focal hepatic lesion.  The gallbladder is collapsed.  The pancreas, spleen, adrenal gland are normal.  The stomach is gas filled similar to prior.  Small bowel and cecum are normal.  The rectum and sigmoid colon is smooth similar to prior with mild wall thickening.  Abdominal aorta normal caliber.  No retroperitoneal periportal lymphadenopathy.  No free fluid the pelvis.  The prostate gland is normal.  The bladder is mildly distended.  No evidence of bladder stones. Review of  bone windows demonstrates no aggressive osseous lesions.  IMPRESSION:  1.  Partially obstructing calculus at the right ureteral pelvic junction is not changed from prior. 2.  Bilateral nephrolithiasis.  3.   Smooth contour to the sigmoid colon and proximal rectum could represent chronic inflammation.  There is some retention of stool in the rectum.  Original Report Authenticated By: Genevive Bi, M.D.   Dg Chest 2 View  06/23/2012  *RADIOLOGY REPORT*  Clinical Data: Tachycardia, fever, shortness of breath.  CHEST - 2 VIEW  Comparison: 06/16/2012  Findings: Hypoaeration with interstitial vascular crowding. Prominent cardiomediastinal contours, similar to prior.  Central vascular congestion.  Mild lung base opacities.  Otherwise, no focal consolidation.  No pleural effusion or pneumothorax. Osteopenia.  IMPRESSION: Hypoaeration and mild bibasilar opacities; atelectasis versus infiltrate.  Prominent cardiomediastinal contours are similar to  prior.  Original Report Authenticated By: Waneta Martins, M.D.   Dg Chest 2 View  06/16/2012  *RADIOLOGY REPORT*  Clinical Data: Fever.  Short of breath.  CHEST - 2 VIEW  Comparison: 02/05/2012  Findings: Lungs are markedly under aerated with bibasilar atelectasis.  Heart is upper normal in size allowing for low volumes.  There is persistent marked distention of the transverse colon and probably associated small bowel loops as previously seen. No obvious free intraperitoneal gas.  IMPRESSION: Bibasilar atelectasis.  Stable colonic distention.  Original Report Authenticated By: [name], M.D.   Dg Abd 1 View  06/17/2012  *RADIOLOGY REPORT*  Clinical Data: Right ureteral calculus and fever.  ABDOMEN - 1 VIEW  Comparison: CT of the abdomen dated 06/04/2012  Findings: Single image was obtained intraoperatively with a C-arm and saved.  This shows the superior aspect of a ureteral stent which extends up into the expected position of the central collecting system.  IMPRESSION: Imaging during right ureteral stent placement.  Original Report Authenticated By: Reola Calkins, M.D.   US Renal  06/27/2012  *RADIOLOGY REPORT*  Clinical Data: Right ureteral stone extraction.  RENAL/URINARY TRACT ULTRASOUND COMPLETE  Comparison:  06/04/2012.  Findings:  Right Kidney:  11.1 cm.  Minimal pelvic fullness, and allowing for differences  in technique, the pelvic distention is decreased compared to CT 06/04/2012.  Small residual renal collecting system calculi are present with posterior acoustic shadowing.  1 cm calculus is present in the right upper renal pole. This the previous of the visualized stent remains present, it is not seen.  Left Kidney:  11.7 cm.  No hydronephrosis.  Small nonobstructing renal calculi with posterior acoustic shadowing.  Bladder:  Decompressed with Foley catheter.  IMPRESSION: 1.  Nearly resolved right hydronephrosis.  1 cm nonobstructing right upper pole renal collecting system calculus  remains. 2.  Small nonobstructing left renal collecting system calculi.  No left hydronephrosis. 3.  Decompressed urinary bladder with Foley catheter.  Original Report Authenticated By: Andreas Newport, M.D.   Dg Chest Port 1 View  06/24/2012  *RADIOLOGY REPORT*  Clinical Data: Evaluate bibasilar opacities  PORTABLE CHEST - 1 VIEW  Comparison: 06/23/2012; 06/16/2012  Findings: Grossly unchanged enlarged cardiac silhouette and mediastinal contours.  Interval placement of a right upper extremity approach PICC line with tip overlying the superior cavoatrial junction.  Lung volumes remain persistently reduced. Pulmonary vasculature is indistinct with cephalization of flow. Minimal increase in bilateral infrahilar heterogeneous opacities. No definite pleural effusion or pneumothorax.  Unchanged bones.  IMPRESSION: 1.  Right upper extremity approach PICC line tip overlies the superior cavoatrial junction. 2. Minimal increase in bilateral infrahilar heterogeneous opacities, atelectasis versus infiltrate.  Further evaluation with a PA and lateral chest radiograph may be obtained as clinically indicated. 3.  Suspect mild pulmonary edema.  Original Report Authenticated By: Waynard Reeds, M.D.    Scheduled Meds:   . albuterol  2.5 mg Nebulization BID  . antiseptic oral rinse  15 mL Mouth Rinse q12n4p  . chlorhexidine  15 mL Mouth Rinse BID  . enoxaparin (LOVENOX) injection  40 mg Subcutaneous Q24H  . ferrous sulfate  325 mg Oral BID WC  . fluconazole  400 mg Oral Daily  . fluconazole  800 mg Oral Once  . PHENobarbital  162 mg Oral QHS  . phenytoin  175 mg Oral BID  . sodium chloride  10-40 mL Intracatheter Q12H  . DISCONTD: micafungin [facility]) IV  100 mg Intravenous Daily   Continuous Infusions:   . sodium chloride 0.45 % with kcl 75 mL/hr at 06/29/12 1823    Principal Problem:  *Sepsis Active Problems:  Healthcare-associated pneumonia  Epilepsy  Mental retardation  Nephrolithiasis   Hydronephrosis  Leukocytosis  Hypokalemia  UTI (lower urinary tract infection)  Iron deficiency anemia  Fungemia    Time spent: > 35 minutes    Penny Pia  [facility] Pager [phone]. If 8PM-8AM, please contact night-coverage at [email or web address], password [facility] 06/29/2012, 7:18 PM  LOS: 7 days

## 2012-06-29 NOTE — Progress Notes (Signed)
Infectious Diseases Initial Consultation  Reason for Consultation:  Management of yeast in blood   HPI: Charles Humphrey is a 56 y.o. male with hx of MR, [facility] resident came in with fever and UTI after lithotripsy.  Noted to have yeast in 1/2 blood cultures.  Some yeast in urine.    Past Medical History  Diagnosis Date  . Ulcerative colitis, unspecified   . Metabolic encephalopathy   . Unspecified intellectual disabilities   . Obesity, unspecified   . Hyperpotassemia   . Other convulsions   . Other lymphedema   . Unspecified constipation   . Other physical therapy   . Muscle weakness (generalized)   . Nephrolithiasis 01/12/2012  . Hydronephrosis 01/12/2012  . Healthcare-associated pneumonia 01/12/2012  . Intellectual disability   . Fungemia 06/25/2012  . Iron deficiency anemia 06/24/2012    Allergies:  Allergies  Allergen Reactions  . Other Other (See Comments)    No dairy products    Current antibiotics:   MEDICATIONS:    . albuterol  2.5 mg Nebulization BID  . antiseptic oral rinse  15 mL Mouth Rinse q12n4p  . chlorhexidine  15 mL Mouth Rinse BID  . enoxaparin (LOVENOX) injection  40 mg Subcutaneous Q24H  . ferrous sulfate  325 mg Oral BID WC  . micafungin (MYCAMINE) IV  100 mg Intravenous Daily  . PHENobarbital  162 mg Oral QHS  . phenytoin  175 mg Oral BID  . sodium chloride  10-40 mL Intracatheter Q12H  . DISCONTD: cefTRIAXone (ROCEPHIN)  IV  2 g Intravenous Q24H    History  Substance Use Topics  . Smoking status: Never Smoker   . Smokeless tobacco: Never Used  . Alcohol Use: No    History reviewed. No pertinent family history.  Review of Systems - unobtainable from patient.  Family at bedside reports he appears more like his baseline.    OBJECTIVE: Temp:  [98.3 F (36.8 C)-99.3 F (37.4 C)] 98.3 F (36.8 C) (08/06 0620) Pulse Rate:  [100-115] 113  (08/06 0620) Resp:  [18-20] 20  (08/06 0620) BP: (121-132)/(74-78) 121/78 mmHg (08/06 0620) SpO2:   [93 %-97 %] 94 % (08/06 0822) General appearance: no distress Resp: clear to auscultation bilaterally GI: soft, non-tender; bowel sounds normal; no masses,  no organomegaly  LABS: Results for orders placed during the hospital encounter of 06/22/12 (from the past 48 hour(s))  CULTURE, BLOOD (ROUTINE X 2)     Status: Normal (Preliminary result)   Collection Time   06/28/12  2:10 AM      Component Value Range Comment   Specimen Description BLOOD L HAND      Special Requests BOTTLES DRAWN AEROBIC ONLY      Culture  Setup Time 06/28/2012 09:16      Culture        Value:        BLOOD CULTURE RECEIVED NO GROWTH TO DATE CULTURE WILL BE HELD FOR 5 DAYS BEFORE ISSUING A FINAL NEGATIVE REPORT   Report Status PENDING     BASIC METABOLIC PANEL     Status: Abnormal   Collection Time   06/28/12  2:10 AM      Component Value Range Comment   Sodium 136  135 - 145 mEq/L    Potassium 3.7  3.5 - 5.1 mEq/L    Chloride 103  96 - 112 mEq/L    CO2 24  19 - 32 mEq/L    Glucose, Bld 111 (*) 70 - 99  mg/dL    BUN 7  6 - 23 mg/dL    Creatinine, Ser 4.01  0.50 - 1.35 mg/dL    Calcium 8.5  8.4 - 02.7 mg/dL    GFR calc non Af Amer >90  >90 mL/min    GFR calc Af Amer >90  >90 mL/min   CULTURE, BLOOD (ROUTINE X 2)     Status: Normal (Preliminary result)   Collection Time   06/28/12  2:16 AM      Component Value Range Comment   Specimen Description BLOOD L HAND      Special Requests BOTTLES DRAWN AEROBIC ONLY      Culture  Setup Time 06/28/2012 09:16      Culture        Value:        BLOOD CULTURE RECEIVED NO GROWTH TO DATE CULTURE WILL BE HELD FOR 5 DAYS BEFORE ISSUING A FINAL NEGATIVE REPORT   Report Status PENDING     CBC     Status: Abnormal   Collection Time   06/29/12  4:30 AM      Component Value Range Comment   WBC 6.9  4.0 - 10.5 K/uL WHITE COUNT CONFIRMED ON SMEAR   RBC 3.64 (*) 4.22 - 5.81 MIL/uL    Hemoglobin 11.5 (*) 13.0 - 17.0 g/dL    HCT 25.3 (*) 66.4 - 52.0 %    MCV 94.2  78.0 - 100.0  fL    MCH 31.6  26.0 - 34.0 pg    MCHC 33.5  30.0 - 36.0 g/dL    RDW 40.3 (*) 47.4 - 15.5 %    Platelets 318  150 - 400 K/uL     MICRO:  IMAGING: US Renal  06/27/2012  *RADIOLOGY REPORT*  Clinical Data: Right ureteral stone extraction.  RENAL/URINARY TRACT ULTRASOUND COMPLETE  Comparison:  06/04/2012.  Findings:  Right Kidney:  11.1 cm.  Minimal pelvic fullness, and allowing for differences in technique, the pelvic distention is decreased compared to CT 06/04/2012.  Small residual renal collecting system calculi are present with posterior acoustic shadowing.  1 cm calculus is present in the right upper renal pole. This the previous of the visualized stent remains present, it is not seen.  Left Kidney:  11.7 cm.  No hydronephrosis.  Small nonobstructing renal calculi with posterior acoustic shadowing.  Bladder:  Decompressed with Foley catheter.  IMPRESSION: 1.  Nearly resolved right hydronephrosis.  1 cm nonobstructing right upper pole renal collecting system calculus remains. 2.  Small nonobstructing left renal collecting system calculi.  No left hydronephrosis. 3.  Decompressed urinary bladder with Foley catheter.  Original Report Authenticated By: Andreas Newport, M.D.    HISTORICAL MICRO/IMAGING  Assessment/Plan:    1) yeast - Now growing Candida lusitaniae.  This is inherently resistant to amphotericin only so po fluconazole will be fine. I will give him a loading dose of 800 mg and then 400 mg po daily for 14 days from negative blood culture, completing the last dose on 8/18, as long as repeat blood culture remains negative. -he should still get an outpatient eye exam to r/o enophthalmitis prior to treatment completion.    2) UTI - negative culture.  Has completed the course.

## 2012-06-30 DIAGNOSIS — N1330 Unspecified hydronephrosis: Secondary | ICD-10-CM

## 2012-06-30 LAB — CREATININE, SERUM: Creatinine, Ser: 0.6 mg/dL (ref 0.50–1.35)

## 2012-06-30 MED ORDER — FLUCONAZOLE 200 MG PO TABS: 400.0000 mg | ORAL_TABLET | Freq: Every day | ORAL | Status: AC

## 2012-06-30 NOTE — Progress Notes (Signed)
Pharmacy Brief Note:  Phenytoin - Fluconazole Interaction  As described in yesterday's progress note, the addition of fluconazole may increase phenytoin levels, but the magnitude of this effect is not readily predictable.  Recommend:   Follow phenytoin levels after discharge and adjust dosage based upon results.  Would check phenytoin levels twice a week until stable.  Suggest checking next level on 07/01/12.   Elie Goody, PharmD, BCPS Pager: [phone] 06/30/2012  1:03 PM

## 2012-06-30 NOTE — Progress Notes (Signed)
Patient discharged to [facility].  Patient's family aware of discharge.  Patient is now on effective antibiotic by mouth so there is no need for a PICC line as per Dr. Cena Benton.  Report given to [facility] at [facility].  No further questions at this time.  No IV access at this time. Patient escorted off the unit via stretcher.  Patient discharged.

## 2012-06-30 NOTE — Progress Notes (Signed)
Per MD, Pt medically ready for d/c.  Notified Alison at [facility], family and RN  Sent d/c summary to [facility] via [facility].  Confirmed receipt of document by facility.  Facility ready to receive Pt.  Arranged for transportation.  Pt to be d/c'd.  Providence Crosby, LCSWA Clinical Social Work [phone]

## 2012-06-30 NOTE — Progress Notes (Signed)
Speech Language Pathology Dysphagia Treatment Patient Details Name: Charles Humphrey MRN: [id number] DOB: 1956-08-04 Today's Date: 06/30/2012 Time: 1478-2956 SLP Time Calculation (min): 12 min  Assessment / Plan / Recommendation Clinical Impression  Pt ready to discharge, RN reports pt has not consumed Dilantin in juice, SlP put sugar in juice and pt consumed entire four ounces after "tasting" tsp.  Pt opened his mouth to accept more intake via straw.  No clinical s/s of aspiration observed-lingual thrust ongoing.  Provided written signs to transporting service-placed in pt's chart. Suspect swallow is at baseline level -and recommend to encourage intake as pt will accept. Full breakfast tray at bedside and intake not documented.      Diet Recommendation  Continue with Current Diet: Dysphagia 3 (mechanical soft);Thin liquid    SLP Plan All goals met   Pertinent Vitals/Pain Afebrile, decr   Swallowing Goals  SLP Swallowing Goals Swallow Study Goal #2 - Progress: Met  General Temperature Spikes Noted: No Respiratory Status: Room air Behavior/Cognition: Alert;Uncooperative Oral Cavity - Dentition: Adequate natural dentition Patient Positioning: Upright in bed  Oral Cavity - Oral Hygiene Does patient have any of the following "at risk" factors?: None of the above   Dysphagia Treatment Treatment focused on: Skilled observation of diet tolerance Family/Caregiver Educated: no family present Treatment Methods/Modalities: Skilled observation Patient observed directly with PO's: Yes Type of PO's observed: Thin liquids (medicine placed in juice - with sugar packets in place) Reason PO's not observed:  (initially pt refused but after tsp accepted, he drank juice) Feeding: Total assist Liquids provided via: Straw Pharyngeal Phase Signs & Symptoms: Other (comment) (lingual thrust) Type of cueing: Verbal Amount of cueing: Total (pt unable to follow commands)   GO    Charles Burnet, MS  [facility] SLP [phone]

## 2012-06-30 NOTE — Discharge Summary (Addendum)
Physician Discharge Summary  Charles Humphrey ZOX:[id number] DOB: 1956/04/14 DOA: 06/22/2012  PCP: Kaleen Mask, MD  Admit date: 06/22/2012 Discharge date: 06/30/2012  Recommendations for Outpatient Follow-up:  1. Please make sure patient has f/u appointment with ophthalmologist due to recent h/o fungemia this admission.  Also Patient needs to f/u with Urologist Bjorn Pippin in one month for repeat renal U/S  Discharge Diagnoses:  Principal Problem:  *Fungemia Active Problems:  Healthcare-associated pneumonia  Epilepsy  Mental retardation  Nephrolithiasis  Hydronephrosis  UTI (lower urinary tract infection)  Iron deficiency anemia   Discharge Condition: Stable  Diet recommendation: Regular diet  Wt Readings from Last 3 Encounters:  06/26/12 81.2 kg (179 lb 0.2 oz)  06/22/12 81.874 kg (180 lb 8 oz)  06/22/12 81.874 kg (180 lb 8 oz)    History of present illness:  From original HPI: 56 yo male with moderate to severe mental retardation, epilepsy who lives at snf and is bedbound and nonverbal at baseline over the last week or so has been battling ureteral/kidney stones and just had a stent placed rt side by urology yesterday and sent back to snf. He has been on cipro and his urine cx have been negative. Sent back from snf due to high fevers and high heart rate. Pt cannot provide any history due to his chronic mental retardation, he does awaken to voice. He was found to be hypoxic on arrival and placed on oxygen. DNR paperwork is with him from his snf. i have talked to the nurse aide, he has not had any stooling since here in the ED.   Hospital Course:  Charles Humphrey is a 55 y.o. male with MR, NH resident, recent right ureteroscopy and lithotripsy of pelvic stone presented to [facility] 7/31 with fever 1 day after procedure. There was concern for pneumonia, pyelonephritis with ua significant for WBC, many bacteria. Had catheter in place. He was on cipro chonically (?suppressive  therapy). Noted to be hypoxic, though no hypoxia documented here that I can find. Febrile to 102 and tachycardic, concern for SIRS and admitted to stepdown.  At baseline, he is non-verbal, bedbound.   1 sepsis  Likely secondary to fungemia from recent bladder instrumentation versus secondary to urinary tract infection. Patient finished a 7 day course of antibiotics.  Is currently on fluconazole which he is to take until 8/18  #2 Fungemia  - ID on board and we will f/u with their recommendations: Pt will need outpatient eye exam  - Switched to po fluconazole today Should get treatment until 8/18 as outlined by infectious disease.  - growing Candida lusitaniae.   #3 urinary tract infection  Urine cultures are growing yeast 55,000 colonies. Patient also with yeast in his blood stream. Patient has been started on IV micafungin and now on oral fluconazole (micafungin discontinued). 7 days of antibiotics and therefore has been discontinued.   #4 questionable healthcare associated pneumonia  On admission and per family patient did have a productive cough and some congestion and also question of possible aspiration pneumonia. Doubt if patient has a pneumonia. Patient has been seen by speech therapy and does not show any signs or symptoms of aspiration when being observed being fed.  Sputum cultures pending.  - Per speech therapy placed patient on Mechanical soft diet with pureed meats.   #5 status post right renal pelvic stone laser lithotripsy  Dr. Annabell Howells of urology is following and stent has been removed however Foley catheter will remain for now per urology.  Patient's blood cultures is growing yeast. Patient was on IV micafungin then transitioned to oral fluconazole, please see above. Urology is following and appreciate input and recommendations. OK to d/c off of foley once ready for discharge reportedly.  Patient is to go to urologist in 1 month for repeat renal U/S  #6 hypokalemia  Repleted. And  resolved   #7 severe iron deficiency anemia  H&H is stable. Per urology minimal blood loss. Patient does not have any gross GI bleed. Anemia panel consistent with severe iron deficiency anemia with iron level less than 10. Patient may benefit from IV and later stayed. May also benefit from iron supplements on discharge. Follow H&H.  - Start Ferrous sulfate   #8 history of seizure/epilepsy  No seizures noted.  Transitioned to oral antiepileptic medication and tolerating well.    Procedures:  Please refer to chart and above  Consultations:  Urology  ID  Discharge Exam: Filed Vitals:   06/30/12 0615  BP: 137/89  Pulse: 95  Temp: 98.3 F (36.8 C)  Resp: 20   Filed Vitals:   06/29/12 1941 06/29/12 2235 06/30/12 0615 06/30/12 0929  BP:  123/81 137/89   Pulse: 107 115 95   Temp:  99 F (37.2 C) 98.3 F (36.8 C)   TempSrc:  Axillary Axillary   Resp: 20 20 20    Height:      Weight:      SpO2: 94% 95% 95% 96%    General: Pt in NAD, A and O x 3 Cardiovascular: RRR, No MRG Respiratory: CTA BL, No wheezes  Discharge Instructions  Discharge Orders    Future Orders Please Complete By Expires   Diet - low sodium heart healthy      Increase activity slowly      Discharge instructions      Comments:   Patient will need to f/u with his urologist in 1 month for repeat ultrasound.   Also due to fungemia he will need outpatient eye exam by ophthalmologist.  Please call and set up appointment.  Take Fluconazole 400 mg po q daily until 07/11/12   Call MD for:  temperature >100.4      Call MD for:  redness, tenderness, or signs of infection (pain, swelling, redness, odor or green/yellow discharge around incision site)      Call MD for:  extreme fatigue        Medication List  As of 06/30/2012 11:28 AM   STOP taking these medications         ciprofloxacin 500 MG tablet      HYDROcodone-acetaminophen 5-325 MG per tablet         TAKE these medications         albuterol  (2.5 MG/3ML) 0.083% nebulizer solution   Commonly known as: PROVENTIL   Take 2.5 mg by nebulization every 6 (six) hours as needed. For shortness of breath      docusate sodium 250 MG capsule   Commonly known as: COLACE   Take 250 mg by mouth daily.      fluconazole 200 MG tablet   Commonly known as: DIFLUCAN   Take 2 tablets (400 mg total) by mouth daily.      lactulose 10 GM/15ML solution   Commonly known as: CHRONULAC   Take 30 g by mouth 3 (three) times daily.      PHENObarbital 100 MG tablet   Commonly known as: LUMINAL   Take 100 mg by mouth at bedtime. Takes along with  a 60mg  tablet for a total of 160mg       PHENObarbital 60 MG tablet   Commonly known as: LUMINAL   Take 60 mg by mouth at bedtime. Takes along with a 100mg  tablet for a total dosing of 160mg       phenytoin 125 MG/5ML suspension   Commonly known as: DILANTIN   Take 175 mg by mouth every 12 (twelve) hours.      potassium chloride 10 MEQ tablet   Commonly known as: K-DUR   Take 20 mEq by mouth every morning.      Tamsulosin HCl 0.4 MG Caps   Commonly known as: FLOMAX   Take 0.4 mg by mouth daily after breakfast.           Follow-up Information    Follow up with Anner Crete, MD in 1 month. (Please call for an appt to be seen in one month)    Contact information:   [address] [place] [address] [place] [address] [phone]           The results of significant diagnostics from this hospitalization (including imaging, microbiology, ancillary and laboratory) are listed below for reference.    Significant Diagnostic Studies: Ct Abdomen Pelvis Wo Contrast  06/04/2012  *RADIOLOGY REPORT*  Clinical Data: History kidney stones.  Reevaluation  CT ABDOMEN AND PELVIS WITHOUT CONTRAST  Technique:  Multidetector CT imaging of the abdomen and pelvis was performed following the standard protocol without intravenous contrast.  Comparison: CT 05/21/2012  Findings:  Renal:  Non-IV contrast images  demonstrate a partially obstructing calculus at the right ureteral pelvic junction measuring 9 mm not changed from prior.  There is mild hydronephrosis of the right which is similar to prior.  There is nonobstructing 5 mm calculus in the upper pole right kidney.  No distal right ureteral calculi. In the left kidney, there are three nonobstructing calculi ranging in size from 3 to  4 mm.  No obstructive uropathy on the left.  No left ureterolithiasis.  Lung bases are clear.  No pericardial fluid.  No focal hepatic lesion.  The gallbladder is collapsed.  The pancreas, spleen, adrenal gland are normal.  The stomach is gas filled similar to prior.  Small bowel and cecum are normal.  The rectum and sigmoid colon is smooth similar to prior with mild wall thickening.  Abdominal aorta normal caliber.  No retroperitoneal periportal lymphadenopathy.  No free fluid the pelvis.  The prostate gland is normal.  The bladder is mildly distended.  No evidence of bladder stones. Review of  bone windows demonstrates no aggressive osseous lesions.  IMPRESSION:  1.  Partially obstructing calculus at the right ureteral pelvic junction is not changed from prior. 2.  Bilateral nephrolithiasis.  3.   Smooth contour to the sigmoid colon and proximal rectum could represent chronic inflammation.  There is some retention of stool in the rectum.  Original Report Authenticated By: Genevive Bi, M.D.   Dg Chest 2 View  06/23/2012  *RADIOLOGY REPORT*  Clinical Data: Tachycardia, fever, shortness of breath.  CHEST - 2 VIEW  Comparison: 06/16/2012  Findings: Hypoaeration with interstitial vascular crowding. Prominent cardiomediastinal contours, similar to prior.  Central vascular congestion.  Mild lung base opacities.  Otherwise, no focal consolidation.  No pleural effusion or pneumothorax. Osteopenia.  IMPRESSION: Hypoaeration and mild bibasilar opacities; atelectasis versus infiltrate.  Prominent cardiomediastinal contours are similar to  prior.  Original Report Authenticated By: Waneta Martins, M.D.   Dg Chest 2  View  06/16/2012  *RADIOLOGY REPORT*  Clinical Data: Fever.  Short of breath.  CHEST - 2 VIEW  Comparison: 02/05/2012  Findings: Lungs are markedly under aerated with bibasilar atelectasis.  Heart is upper normal in size allowing for low volumes.  There is persistent marked distention of the transverse colon and probably associated small bowel loops as previously seen. No obvious free intraperitoneal gas.  IMPRESSION: Bibasilar atelectasis.  Stable colonic distention.  Original Report Authenticated By: [name], M.D.   Dg Abd 1 View  06/17/2012  *RADIOLOGY REPORT*  Clinical Data: Right ureteral calculus and fever.  ABDOMEN - 1 VIEW  Comparison: CT of the abdomen dated 06/04/2012  Findings: Single image was obtained intraoperatively with a C-arm and saved.  This shows the superior aspect of a ureteral stent which extends up into the expected position of the central collecting system.  IMPRESSION: Imaging during right ureteral stent placement.  Original Report Authenticated By: Reola Calkins, M.D.   US Renal  06/27/2012  *RADIOLOGY REPORT*  Clinical Data: Right ureteral stone extraction.  RENAL/URINARY TRACT ULTRASOUND COMPLETE  Comparison:  06/04/2012.  Findings:  Right Kidney:  11.1 cm.  Minimal pelvic fullness, and allowing for differences in technique, the pelvic distention is decreased compared to CT 06/04/2012.  Small residual renal collecting system calculi are present with posterior acoustic shadowing.  1 cm calculus is present in the right upper renal pole. This the previous of the visualized stent remains present, it is not seen.  Left Kidney:  11.7 cm.  No hydronephrosis.  Small nonobstructing renal calculi with posterior acoustic shadowing.  Bladder:  Decompressed with Foley catheter.  IMPRESSION: 1.  Nearly resolved right hydronephrosis.  1 cm nonobstructing right upper pole renal collecting system calculus  remains. 2.  Small nonobstructing left renal collecting system calculi.  No left hydronephrosis. 3.  Decompressed urinary bladder with Foley catheter.  Original Report Authenticated By: Andreas Newport, M.D.   Dg Chest Port 1 View  06/24/2012  *RADIOLOGY REPORT*  Clinical Data: Evaluate bibasilar opacities  PORTABLE CHEST - 1 VIEW  Comparison: 06/23/2012; 06/16/2012  Findings: Grossly unchanged enlarged cardiac silhouette and mediastinal contours.  Interval placement of a right upper extremity approach PICC line with tip overlying the superior cavoatrial junction.  Lung volumes remain persistently reduced. Pulmonary vasculature is indistinct with cephalization of flow. Minimal increase in bilateral infrahilar heterogeneous opacities. No definite pleural effusion or pneumothorax.  Unchanged bones.  IMPRESSION: 1.  Right upper extremity approach PICC line tip overlies the superior cavoatrial junction. 2. Minimal increase in bilateral infrahilar heterogeneous opacities, atelectasis versus infiltrate.  Further evaluation with a PA and lateral chest radiograph may be obtained as clinically indicated. 3.  Suspect mild pulmonary edema.  Original Report Authenticated By: Waynard Reeds, M.D.    Microbiology: Recent Results (from the past 240 hour(s))  CULTURE, BLOOD (ROUTINE X 2)     Status: Normal   Collection Time   06/23/12 12:13 AM      Component Value Range Status Comment   Specimen Description BLOOD RIGHT HAND   Final    Special Requests BOTTLES DRAWN AEROBIC ONLY   Final    Culture  Setup Time 06/23/2012 03:52   Final    Culture     Final    Value: CANDIDA LUSITANIAE     Note: Gram Stain Report Called to,Read Back By and Verified With: VELINA PERITIN @ 0045 ON 06/25/12 BY GOLLD   Report Status 06/28/2012 FINAL  Final   CULTURE, BLOOD (ROUTINE X 2)     Status: Normal   Collection Time   06/23/12  1:10 AM      Component Value Range Status Comment   Specimen Description BLOOD RIGHT   Final     Special Requests BOTTLES DRAWN AEROBIC ONLY 3CC   Final    Culture  Setup Time 06/23/2012 03:52   Final    Culture NO GROWTH 5 DAYS   Final    Report Status 06/29/2012 FINAL   Final   URINE CULTURE     Status: Normal   Collection Time   06/23/12  4:26 AM      Component Value Range Status Comment   Specimen Description URINE, RANDOM   Final    Special Requests NONE   Final    Culture  Setup Time 06/23/2012 09:03   Final    Colony Count 55,000 COLONIES/ML   Final    Culture YEAST   Final    Report Status 06/24/2012 FINAL   Final   MRSA PCR SCREENING     Status: Normal   Collection Time   06/23/12  9:12 AM      Component Value Range Status Comment   MRSA by PCR NEGATIVE  NEGATIVE Final   CULTURE, BLOOD (ROUTINE X 2)     Status: Normal (Preliminary result)   Collection Time   06/28/12  2:10 AM      Component Value Range Status Comment   Specimen Description BLOOD L HAND   Final    Special Requests BOTTLES DRAWN AEROBIC ONLY   Final    Culture  Setup Time 06/28/2012 09:16   Final    Culture     Final    Value:        BLOOD CULTURE RECEIVED NO GROWTH TO DATE CULTURE WILL BE HELD FOR 5 DAYS BEFORE ISSUING A FINAL NEGATIVE REPORT   Report Status PENDING   Incomplete   CULTURE, BLOOD (ROUTINE X 2)     Status: Normal (Preliminary result)   Collection Time   06/28/12  2:16 AM      Component Value Range Status Comment   Specimen Description BLOOD L HAND   Final    Special Requests BOTTLES DRAWN AEROBIC ONLY   Final    Culture  Setup Time 06/28/2012 09:16   Final    Culture     Final    Value:        BLOOD CULTURE RECEIVED NO GROWTH TO DATE CULTURE WILL BE HELD FOR 5 DAYS BEFORE ISSUING A FINAL NEGATIVE REPORT   Report Status PENDING   Incomplete      Labs: Basic Metabolic Panel:  Lab 06/30/12 4098 06/28/12 0210 06/26/12 0634 06/25/12 0420 06/24/12 0916 06/24/12 0355  NA -- 136 141 144 -- 146*  K -- 3.7 4.5 3.8 -- 3.4*  CL -- 103 111 113* -- 115*  CO2 -- 24 20 25  -- 24  GLUCOSE  -- 111* 112* 133* -- 155*  BUN -- 7 8 10  -- 12  CREATININE 0.60 0.65 0.64 0.90 -- 1.06  CALCIUM -- 8.5 8.2* 8.3* -- 7.6*  MG -- -- -- -- 1.8 --  PHOS -- -- -- -- -- --   Liver Function Tests:  Lab 06/24/12 0355  AST 32  ALT 20  ALKPHOS 101  BILITOT 0.2*  PROT 5.4*  ALBUMIN 1.8*   No results found for this basename: LIPASE:5,AMYLASE:5 in the last 168 hours No  results found for this basename: AMMONIA:5 in the last 168 hours CBC:  Lab 06/29/12 0430 06/25/12 0420 06/24/12 0355  WBC 6.9 7.4 7.3  NEUTROABS -- 5.5 6.5  HGB 11.5* 11.3* 10.5*  HCT 34.3* 35.1* 33.2*  MCV 94.2 95.6 96.8  PLT 318 228 218   Cardiac Enzymes: No results found for this basename: CKTOTAL:5,CKMB:5,CKMBINDEX:5,TROPONINI:5 in the last 168 hours BNP: BNP (last 3 results) No results found for this basename: PROBNP:3 in the last 8760 hours CBG: No results found for this basename: GLUCAP:5 in the last 168 hours  Time coordinating discharge: >  Signed:  Penny Pia  [facility] 06/30/2012, 11:28 AM  Addendum:  Please note recommendations from pharmacy regarding fluconazole and phenytoin:  Pharmacy Brief Note: Phenytoin - Fluconazole Interaction  As described in yesterday's progress note, the addition of fluconazole may increase phenytoin levels, but the magnitude of this effect is not readily predictable.  Recommend:  Follow phenytoin levels after discharge and adjust dosage based upon results.  Would check phenytoin levels twice a week until stable. Suggest checking next level on 07/01/12.

## 2012-06-30 NOTE — Progress Notes (Signed)
Patient ID: Elana Alm, male   DOB: 01/19/1956, 56 y.o.   MRN: [id number]    Subjective: He remains afebrile on current therapy.   Renal US reviewed.  See note from yesterday. ROS: Pt non-communicative.  Objective: Vital signs in last 24 hours: Temp:  [98 F (36.7 C)-99 F (37.2 C)] 98.3 F (36.8 C) (08/07 0615) Pulse Rate:  [87-115] 95  (08/07 0615) Resp:  [20] 20  (08/07 0615) BP: (123-137)/(81-89) 137/89 mmHg (08/07 0615) SpO2:  [94 %-95 %] 95 % (08/07 0615)  Intake/Output from previous day: 08/06 0701 - 08/07 0700 In: 480 [P.O.:480] Out: 2300 [Urine:2300] Intake/Output this shift:      Lab Results:   Basename 06/29/12 0430  WBC 6.9  HGB 11.5*  HCT 34.3*  PLT 318   BMET  Basename 06/28/12 0210  NA 136  K 3.7  CL 103  CO2 24  GLUCOSE 111*  BUN 7  CREATININE 0.65  CALCIUM 8.5   PT/INR No results found for this basename: LABPROT:2,INR:2 in the last 72 hours ABG No results found for this basename: PHART:2,PCO2:2,PO2:2,HCO3:2 in the last 72 hours  Studies/Results: [place] results reviewed. No results found.  Anti-infectives: Anti-infectives     Start     Dose/Rate Route Frequency Ordered Stop   06/30/12 1000   fluconazole (DIFLUCAN) tablet 400 mg        400 mg Oral Daily 06/29/12 1414     06/29/12 1500   fluconazole (DIFLUCAN) tablet 800 mg        800 mg Oral  Once 06/29/12 1414 06/29/12 1546   06/25/12 1200   fluconazole (DIFLUCAN) IVPB 400 mg  Status:  Discontinued        400 mg 200 mL/hr over 60 Minutes Intravenous Every 24 hours 06/25/12 0900 06/25/12 0925   06/25/12 1200   cefTRIAXone (ROCEPHIN) 2 g in dextrose 5 % 50 mL IVPB  Status:  Discontinued        2 g 100 mL/hr over 30 Minutes Intravenous Every 24 hours 06/25/12 1057 06/28/12 1510   06/25/12 1100   micafungin (MYCAMINE) 100 mg in sodium chloride 0.9 % 100 mL IVPB  Status:  Discontinued        100 mg 100 mL/hr over 1 Hours Intravenous Daily 06/25/12 0926 06/29/12 1330   06/25/12 0130   fluconazole (DIFLUCAN) IVPB 200 mg  Status:  Discontinued        200 mg 100 mL/hr over 60 Minutes Intravenous Every 24 hours 06/25/12 0126 06/25/12 0138   06/24/12 1400   fluconazole (DIFLUCAN) IVPB 200 mg  Status:  Discontinued        200 mg 100 mL/hr over 60 Minutes Intravenous Every 24 hours 06/24/12 1320 06/25/12 0859   06/23/12 1000   vancomycin (VANCOCIN) IVPB 1000 mg/200 mL premix  Status:  Discontinued        1,000 mg 200 mL/hr over 60 Minutes Intravenous Every 8 hours 06/23/12 0917 06/25/12 0838   06/23/12 0930   ceFEPIme (MAXIPIME) 1 g in dextrose 5 % 50 mL IVPB  Status:  Discontinued        1 g 100 mL/hr over 30 Minutes Intravenous 3 times per day 06/23/12 0840 06/25/12 1005   06/23/12 0930   levofloxacin (LEVAQUIN) IVPB 750 mg  Status:  Discontinued        750 mg 100 mL/hr over 90 Minutes Intravenous Every 24 hours 06/23/12 0840 06/25/12 1057   06/23/12 0230   vancomycin (VANCOCIN) IVPB 1000 mg/200 mL  premix  Status:  Discontinued        1,000 mg 200 mL/hr over 60 Minutes Intravenous  Once 06/23/12 0225 06/24/12 1906   06/23/12 0230  piperacillin-tazobactam (ZOSYN) IVPB 3.375 g       3.375 g 100 mL/hr over 30 Minutes Intravenous  Once 06/23/12 0225 06/23/12 0334          Current Facility-Administered Medications  Medication Dose Route Frequency Provider Last Rate Last Dose  . acetaminophen (TYLENOL) suppository 650 mg  650 mg Rectal Q4H PRN Rodolph Bong, MD   650 mg at 06/23/12 2021  . albuterol (PROVENTIL) (5 MG/ML) 0.5% nebulizer solution 2.5 mg  2.5 mg Nebulization BID Rodolph Bong, MD   2.5 mg at 06/29/12 1941  . antiseptic oral rinse (BIOTENE) solution 15 mL  15 mL Mouth Rinse q12n4p Rodolph Bong, MD   15 mL at 06/29/12 1547  . chlorhexidine (PERIDEX) 0.12 % solution 15 mL  15 mL Mouth Rinse BID Rodolph Bong, MD   15 mL at 06/29/12 0810  . enoxaparin (LOVENOX) injection 40 mg  40 mg Subcutaneous Q24H Tarry Kos, MD   40 mg  at 06/29/12 1140  . feeding supplement (ENSURE COMPLETE) liquid 237 mL  237 mL Oral Daily PRN Anastasio Champion, RD      . ferrous sulfate tablet 325 mg  325 mg Oral BID WC Penny Pia, MD   325 mg at 06/29/12 1636  . fluconazole (DIFLUCAN) tablet 400 mg  400 mg Oral Daily Christine E Shade, PHARMD      . fluconazole (DIFLUCAN) tablet 800 mg  800 mg Oral Once Winfield Rast, PHARMD   800 mg at 06/29/12 1546  . ondansetron (ZOFRAN) injection 4 mg  4 mg Intravenous Q6H PRN Rodolph Bong, MD      . PHENobarbital (LUMINAL) tablet 162 mg  162 mg Oral QHS Penny Pia, MD   162 mg at 06/27/12 2233  . phenytoin (DILANTIN) 125 MG/5ML suspension 175 mg  175 mg Oral BID Penny Pia, MD   175 mg at 06/29/12 2225  . sodium chloride 0.45 % 1,000 mL with potassium chloride 40 mEq infusion   Intravenous Continuous Rodolph Bong, MD 75 mL/hr at 06/29/12 1823    . sodium chloride 0.9 % injection 10-40 mL  10-40 mL Intracatheter Q12H Rodolph Bong, MD   10 mL at 06/29/12 1009  . sodium chloride 0.9 % injection 10-40 mL  10-40 mL Intracatheter PRN Rodolph Bong, MD      . DISCONTD: micafungin [facility]) 100 mg in sodium chloride 0.9 % 100 mL IVPB  100 mg Intravenous Daily Rodolph Bong, MD   100 mg at 06/29/12 1006    Assessment: He remains afebrile. He had improving hydro on [place] and a retained stone that I didn't see on recent ureteroscopy and it may be submucosal.  Plan: He will need f/u with me in a month for a renal US.   LOS: 8 days    Otilio Groleau J 06/30/2012

## 2012-07-04 LAB — CULTURE, BLOOD (ROUTINE X 2): Culture: NO GROWTH

## 2013-01-26 ENCOUNTER — Encounter: Payer: Self-pay | Admitting: Cardiology

## 2013-07-10 IMAGING — CT CT ABD-PELV W/O CM
1 series · 15 of 26 positions shown, 19 images · non-contrast
Comparison: CT of abdomen and pelvis 01/12/2012.

CLINICAL DATA: Follow-up following percutaneous nephrolithotomy for
left-sided renal stone.

CT ABDOMEN AND PELVIS WITHOUT CONTRAST
TECHNIQUE: Multidetector CT imaging of the abdomen and pelvis was
performed following the standard protocol without intravenous
contrast.

[Series 4: lung windows · axial · 0.90mm/px · z∈[-68,+47]mm · 15 of 26 slices shown, 19 images]
[im 2/26  soft-tissue]
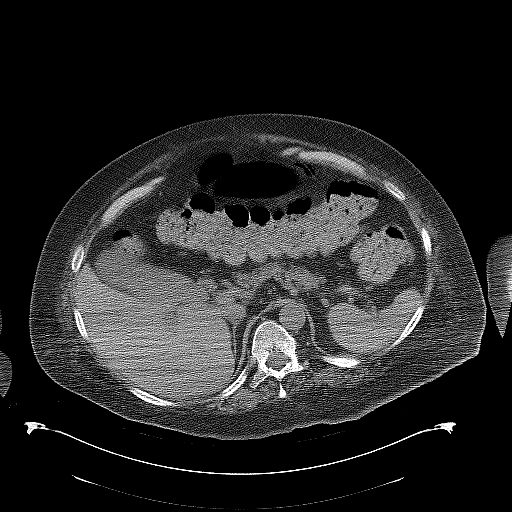
[im 2/26  bone]
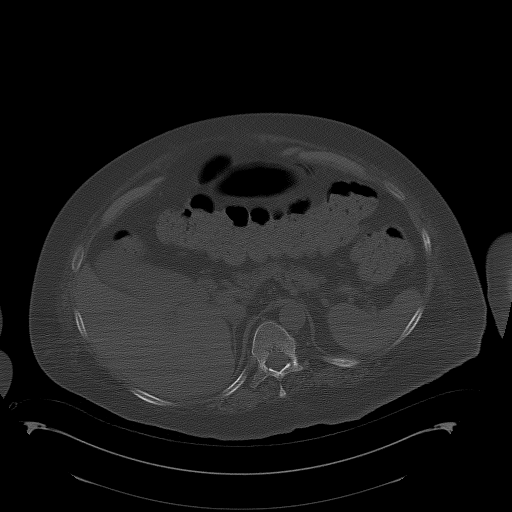
[im 4/26  soft-tissue]
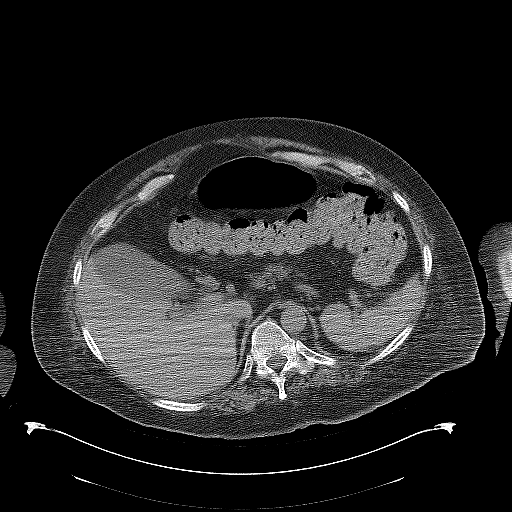
[im 6/26  soft-tissue]
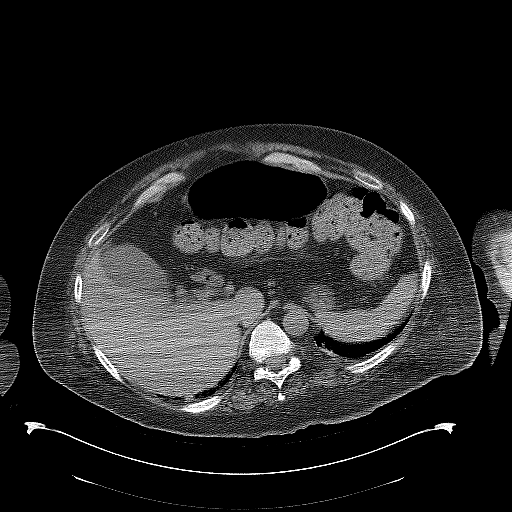
[im 8/26  soft-tissue]
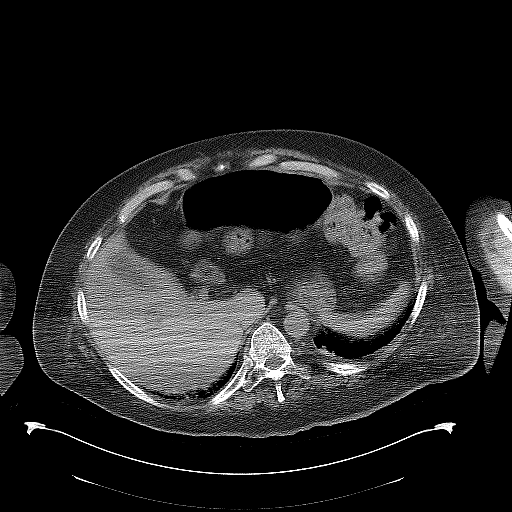
[im 10/26  soft-tissue]
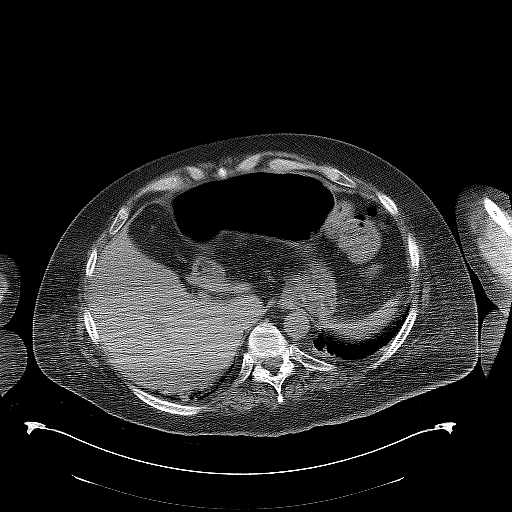
[im 12/26  soft-tissue]
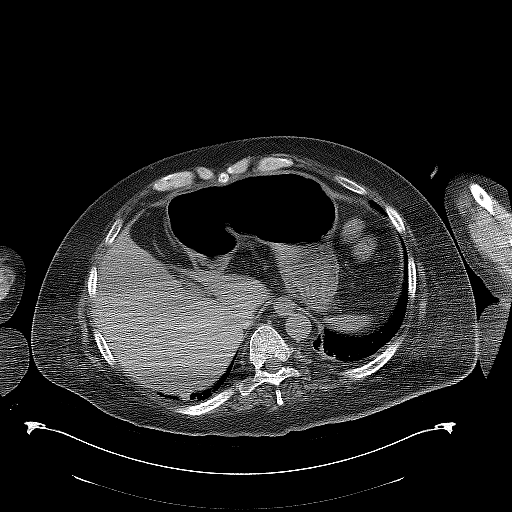
[im 14/26  soft-tissue]
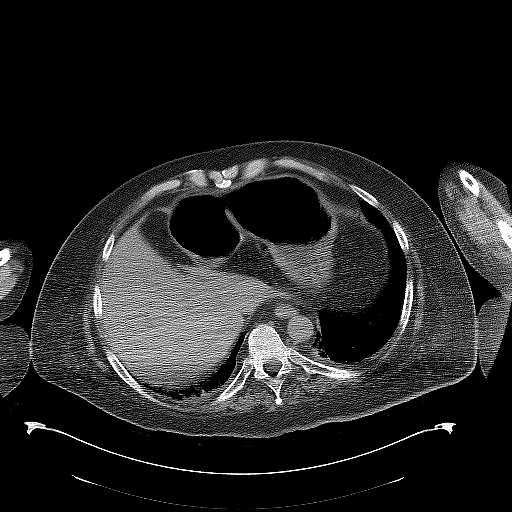
[im 15/26  soft-tissue]
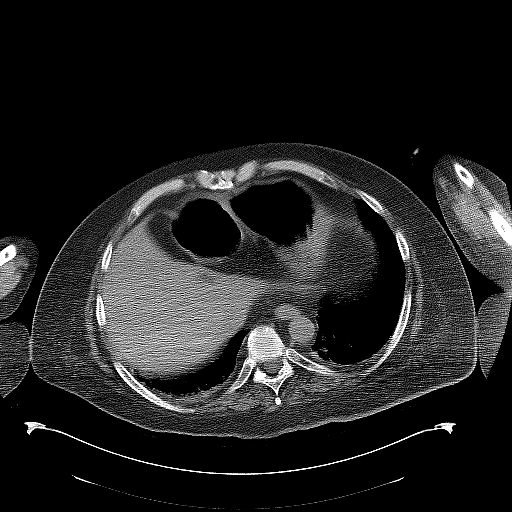
[im 17/26  soft-tissue]
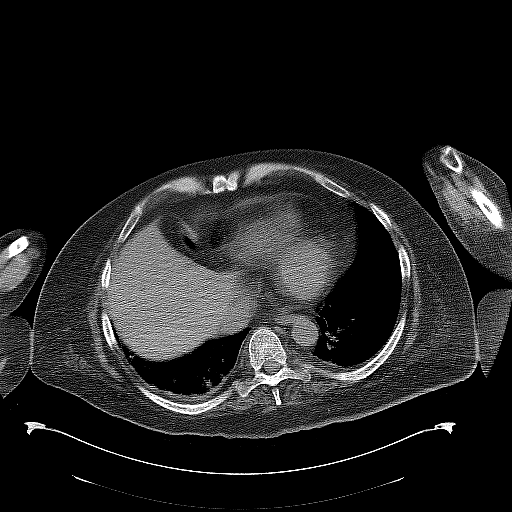
[im 17/26  bone]
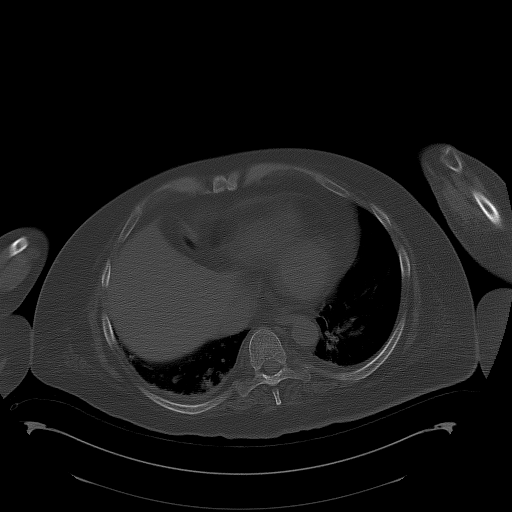
[im 19/26  soft-tissue]
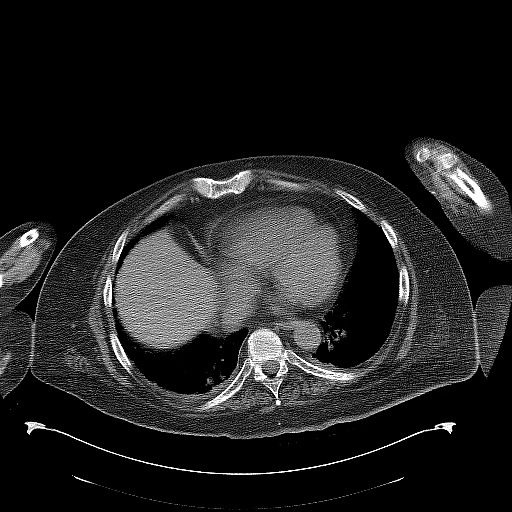
[im 21/26  soft-tissue]
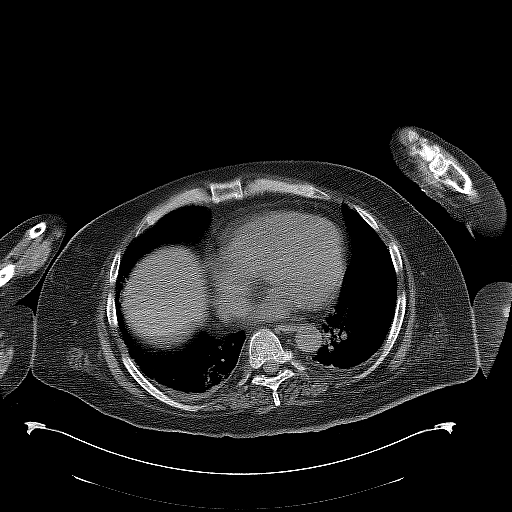
[im 22/26  lung]
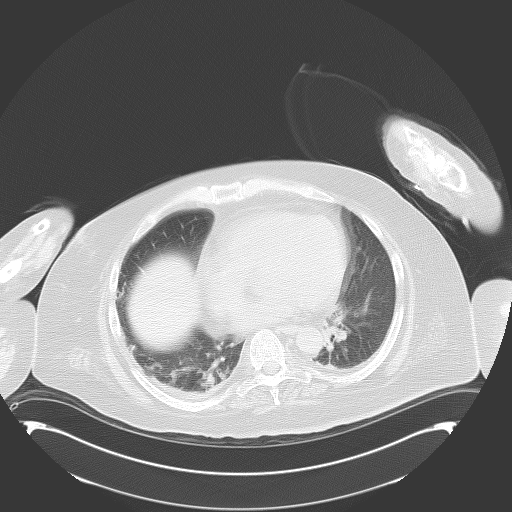
[im 23/26  soft-tissue]
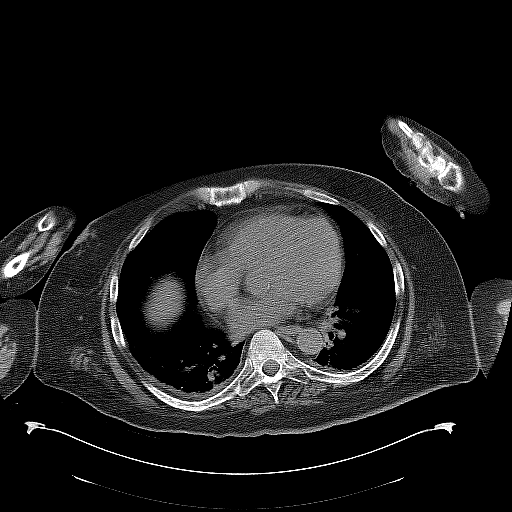
[im 23/26  lung]
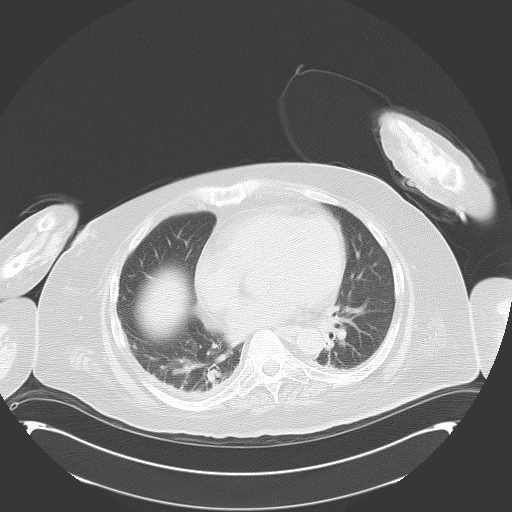
[im 24/26  lung]
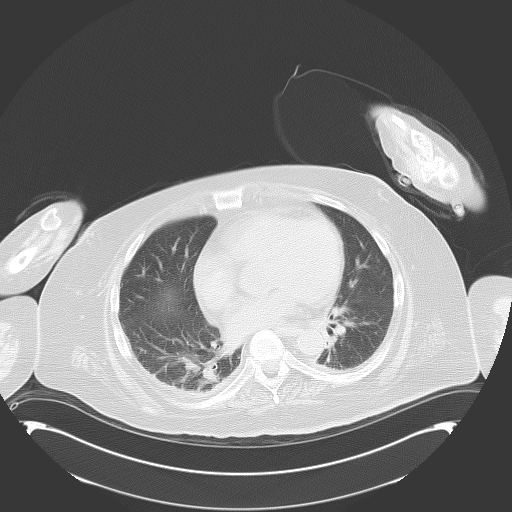
[im 25/26  soft-tissue]
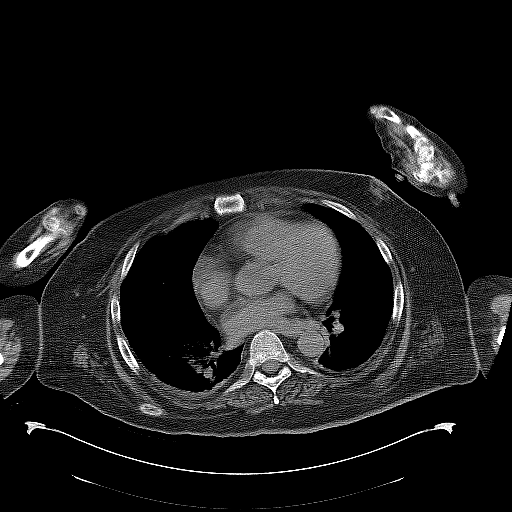
[im 25/26  lung]
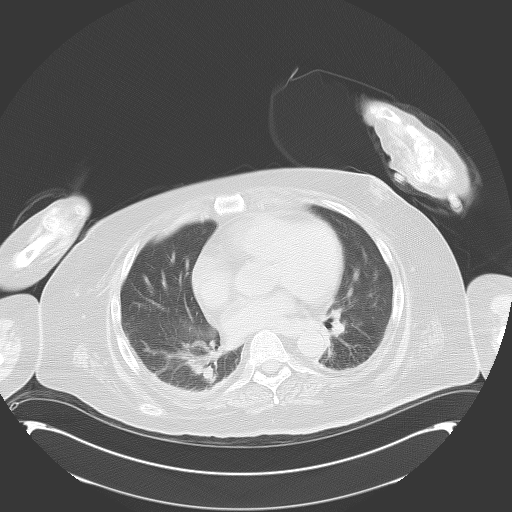

[15 of 26 positions shown; findings below may reference images not displayed]

FINDINGS: To

Lung Bases: Evaluation of the lung bases is limited by respiratory
motion.  Probable subsegmental atelectasis and/or scarring in the
lower lobes of the lungs bilaterally.

Abdomen/Pelvis:  There is a left-sided ureteral stent extending
down to the left ureterovesicular junction.  A left-sided
nephrostomy tube is in place with tip within the left renal
collecting system.  Multiple locules of gas are present within the
collecting system of the left kidney.  Several tiny nonobstructive
calculi are noted within the collecting system of the left kidney.
No definite calculi along the course of the left ureter.  Within
the right kidney there are two nonobstructive calculi measuring up
to 6 mm in length.  No right ureteral calculi are identified.

The left lobe of the liver is diminutive.  The unenhanced
appearance of the liver is otherwise unremarkable.  The
gallbladder, pancreas, spleen and bilateral adrenal glands are
unremarkable in appearance.  Normal appendix.  No ascites or
pneumoperitoneum and no definite pathologic distension of bowel.
No definite pathologic adenopathy appreciated within the abdomen or
pelvis.  A Foley balloon catheter is present within the lumen of
the urinary bladder.  Large amount of gas is noted non dependently
within the urinary bladder, presumably secondary to the indwelling
Foley.

Musculoskeletal: There are no aggressive appearing lytic or blastic
lesions noted in the visualized portions of the skeleton.
IMPRESSION: 1.  Post procedural changes of left sided nephrostomy tube
placement and left-sided ureteral stent placement.  There are
multiple nonobstructive calculi within the collecting systems of
the kidneys bilaterally.  No definite ureteral calculi noted at
this time, and no signs of urinary tract obstruction.
2.  No definite acute findings within the abdomen or pelvis.
3.  Foley catheter in place within the lumen of the urinary
bladder.

## 2013-11-18 IMAGING — RF DG ABDOMEN 1V
1 series · 1 of 1 positions shown · non-contrast
Comparison: CT of the abdomen dated 06/04/2012

CLINICAL DATA: Right ureteral calculus and fever.

ABDOMEN - 1 VIEW

[Series 1: run · 1 of 1 slices shown]
[im 1/1]
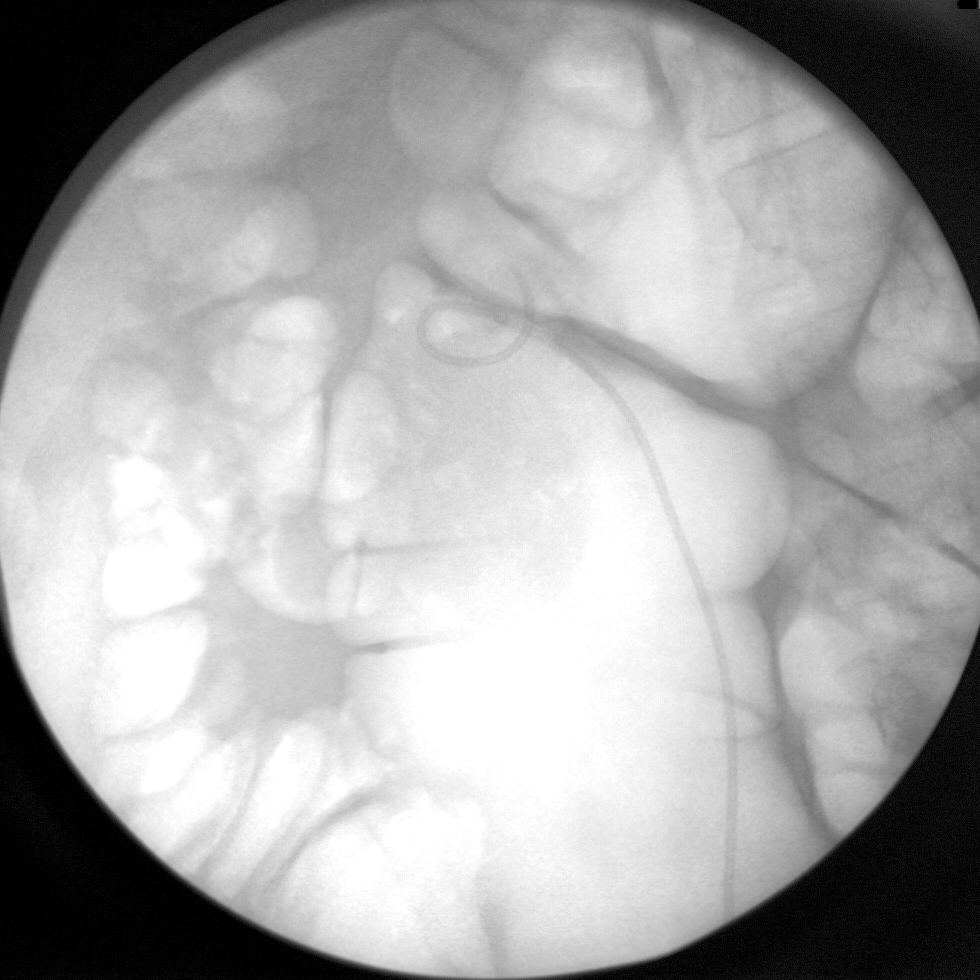

[1 of 1 positions shown; findings below may reference images not displayed]

FINDINGS: Single image was obtained intraoperatively with a C-arm
and saved.  This shows the superior aspect of a ureteral stent
which extends up into the expected position of the central
collecting system.
IMPRESSION: Imaging during right ureteral stent placement.

## 2013-11-25 IMAGING — CR DG CHEST 2V
2 series · 2 of 2 positions shown · non-contrast
Comparison: 06/16/2012

CLINICAL DATA: Tachycardia, fever, shortness of breath.

CHEST - 2 VIEW

[w chest lat]
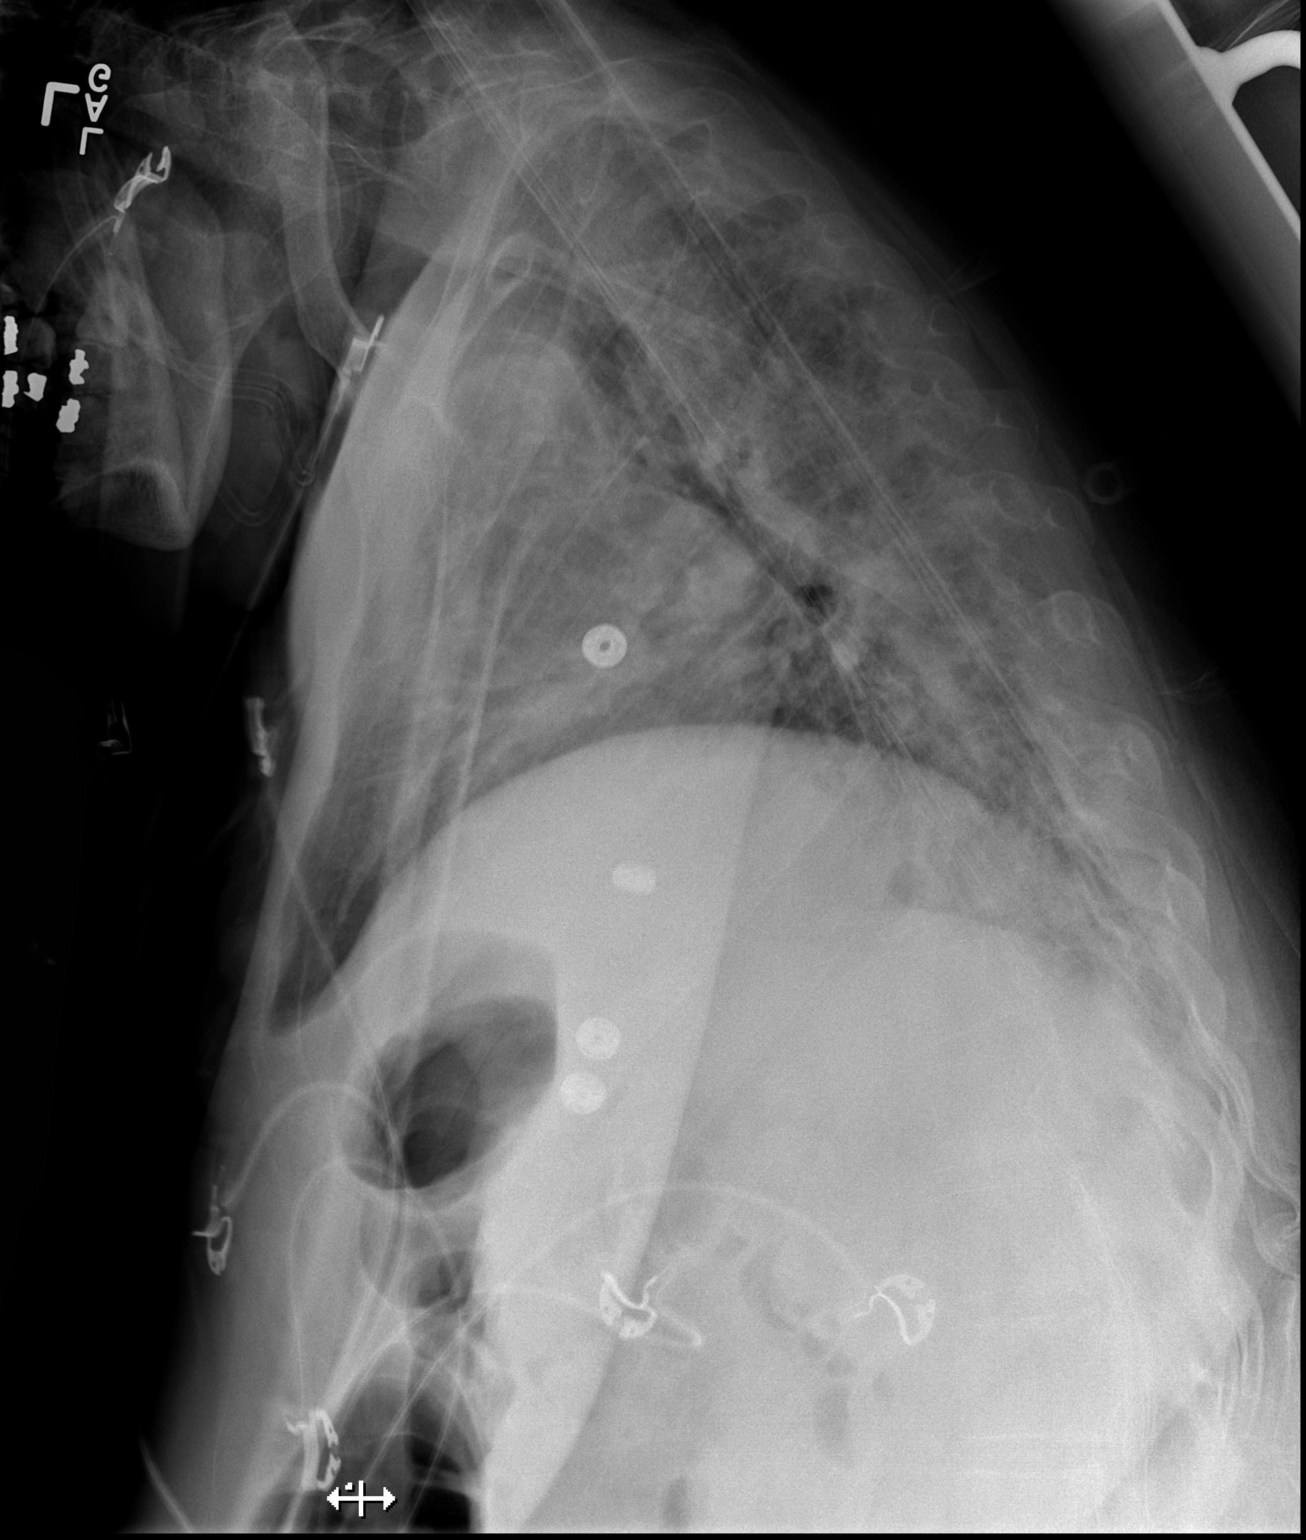

[x chest ap]
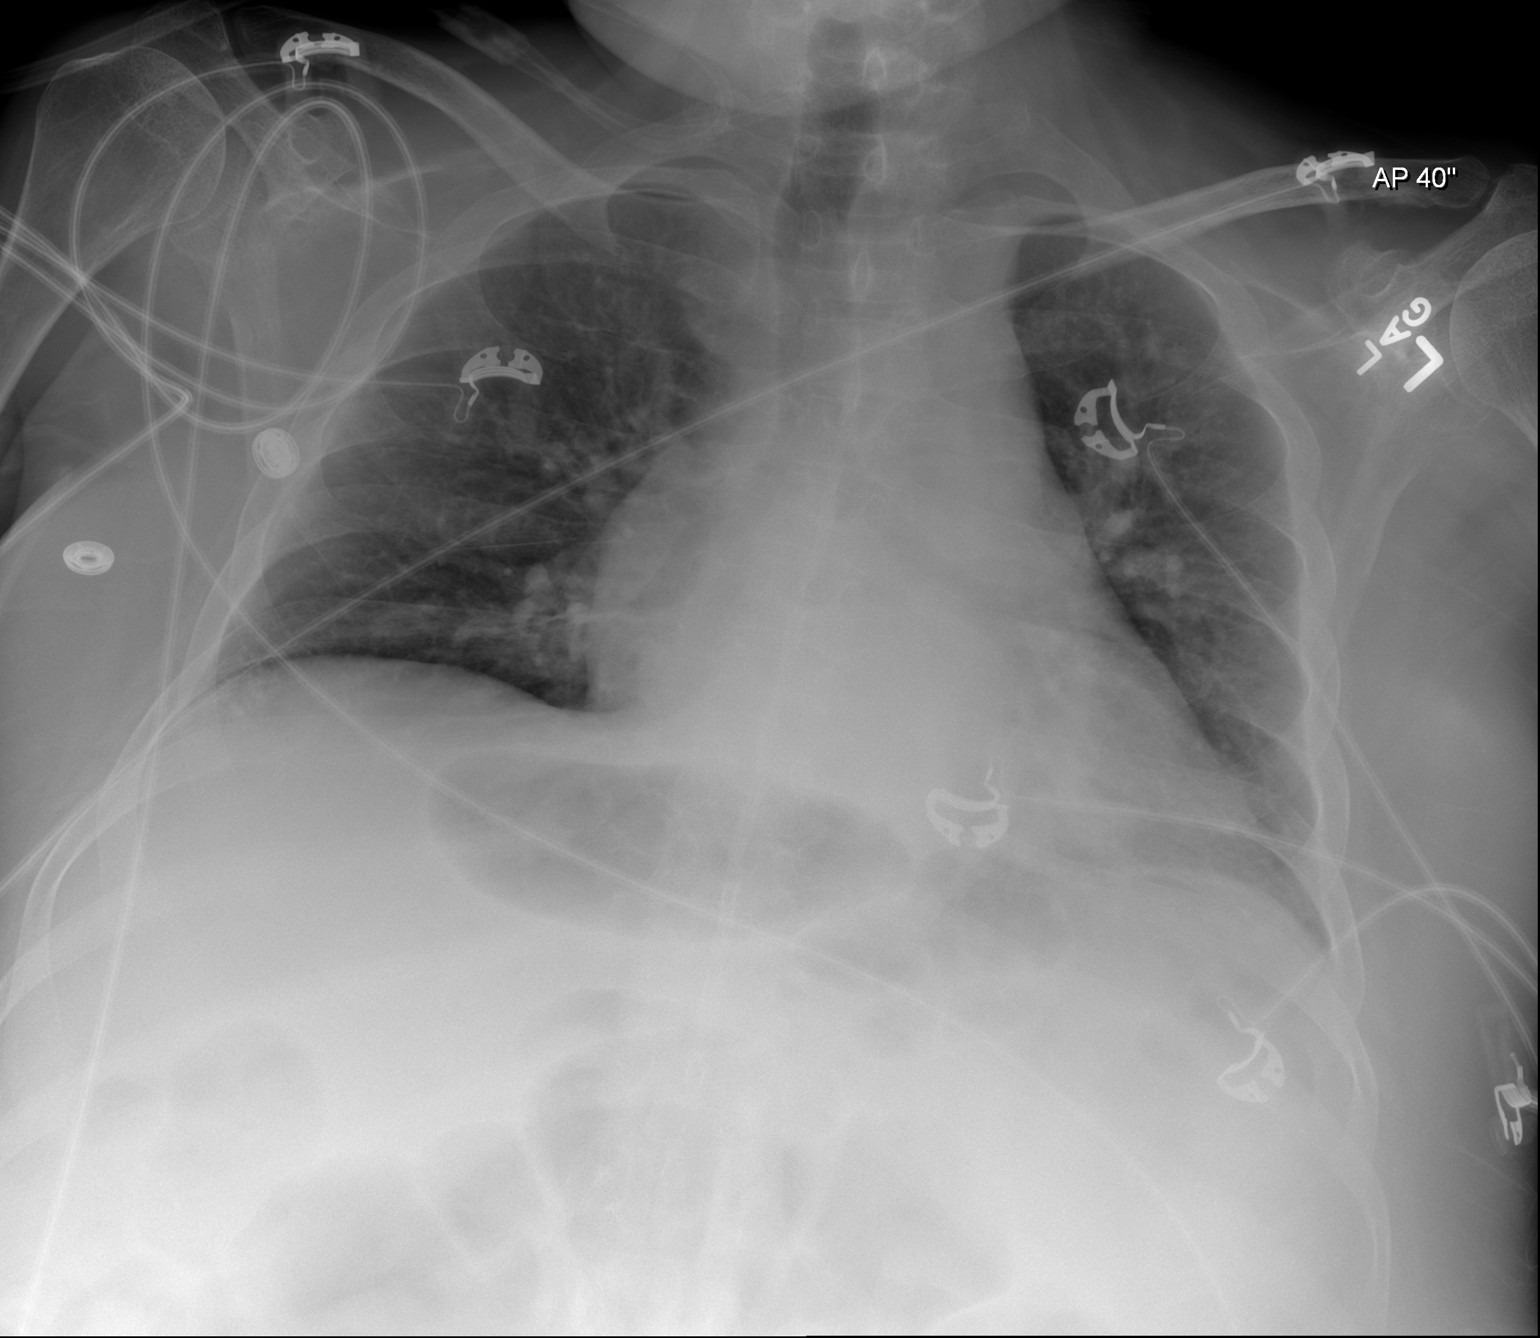

[2 of 2 positions shown; findings below may reference images not displayed]

FINDINGS: Hypoaeration with interstitial vascular crowding.
Prominent cardiomediastinal contours, similar to prior.  Central
vascular congestion.  Mild lung base opacities.  Otherwise, no
focal consolidation.  No pleural effusion or pneumothorax.
Osteopenia.
IMPRESSION: Hypoaeration and mild bibasilar opacities; atelectasis versus
infiltrate.

Prominent cardiomediastinal contours are similar to prior.

## 2018-07-22 ENCOUNTER — Non-Acute Institutional Stay: Payer: Medicare Other | Admitting: Hospice and Palliative Medicine

## 2018-07-22 ENCOUNTER — Telehealth: Payer: Self-pay

## 2018-07-22 DIAGNOSIS — Z515 Encounter for palliative care: Secondary | ICD-10-CM

## 2018-07-22 NOTE — Telephone Encounter (Signed)
Facility NP requested a follow up visit from Palliative Care tomorrow 07/23/18. Patient has elevated dilantin and sodium level. Dilantin is on hold and patient will receive clysis. Follow up visit scheduled with Corrie DandyMary NP

## 2018-07-22 NOTE — Progress Notes (Signed)
PALLIATIVE CARE CONSULT VISIT   PATIENT NAME: Charles Humphrey. DOB: 19-Jun-1956 MRN: [id number]  PRIMARY CARE PROVIDER:   Leonard Downing, MD  REFERRING PROVIDER:  Leonard Downing, MD [place], [place] [address]  RESPONSIBLE PARTY:   Father  ASSESSMENT:     Patient has had a week of poor oral intake. He was evaluated by urology and reportedly found to have non-obstructing kidney stones not amenable to treatment. He has also been treated for constipation. Despite this, oral intake has not improved.   I met with patient's father today. He was very tearful and spoke of how enriched his life has been due to his son. He has been patient's primary caregiver for years following the death of patient's mother. Patient's father remarried and his current wife is under hospice care and just moved to [facility].   Patient's father recognizes that without clinical improvement, patient will likely decline and could be approaching end of life. We talked about workup, treatment, and future medical decisions such as hospitalization, feeding tubes, and hospice care. At this point, patient's father would like to treat the treatable at the facility and is unsure about other medical decisions.   Note that I received a call later from Ferd Hibbs, NP, who is managing patient's care. Apparently, she received a call regarding critically high dilantin and Na. Plan is to temporarily hold dilantin and start Clysis.   I have asked for follow up tomorrow with our team.   RECOMMENDATIONS and PLAN:  1. Continue supportive care  I spent 60 minutes providing this consultation,  from 1100 to 1200. More than 50% of the time in this consultation was spent coordinating communication.   HISTORY OF PRESENT ILLNESS:  Charles Gura. is a 62 y.o. year old male with multiple medical problems including severe mental retardation, seizure d/o, and h/o kidney stones. Patient has  had recent progressive decline with reduced oral intake and increased lethargy. Palliative Care was asked to help address goals of care.   CODE STATUS: DNR  PPS: 20% HOSPICE ELIGIBILITY/DIAGNOSIS: YES  PAST MEDICAL HISTORY:  Past Medical History:  Diagnosis Date  . Fungemia 06/25/2012  . Healthcare-associated pneumonia 01/12/2012  . Hydronephrosis 01/12/2012  . Hyperpotassemia   . Intellectual disability   . Iron deficiency anemia 06/24/2012  . Metabolic encephalopathy   . Muscle weakness (generalized)   . Nephrolithiasis 01/12/2012  . Obesity, unspecified   . Other convulsions   . Other lymphedema   . Other physical therapy   . Ulcerative colitis, unspecified   . Unspecified constipation   . Unspecified intellectual disabilities     SOCIAL HX:  Social History   Tobacco Use  . Smoking status: Never Smoker  . Smokeless tobacco: Never Used  Substance Use Topics  . Alcohol use: No    ALLERGIES:  Allergies  Allergen Reactions  . Other Other (See Comments)    No dairy products     PERTINENT MEDICATIONS:  Outpatient Encounter Medications as of 07/22/2018  Medication Sig  . albuterol (PROVENTIL) (2.5 MG/3ML) 0.083% nebulizer solution Take 2.5 mg by nebulization every 6 (six) hours as needed. For shortness of breath  . docusate sodium (COLACE) 250 MG capsule Take 250 mg by mouth daily.  Marland Kitchen lactulose (CHRONULAC) 10 GM/15ML solution Take 30 g by mouth 3 (three) times daily.  Marland Kitchen PHENObarbital (LUMINAL) 100 MG tablet Take 100 mg by mouth at bedtime. Takes along with a 29m tablet for a  total of 177m  . PHENObarbital (LUMINAL) 60 MG tablet Take 60 mg by mouth at bedtime. Takes along with a 1097mtablet for a total dosing of 16087m. phenytoin (DILANTIN) 125 MG/5ML suspension Take 175 mg by mouth every 12 (twelve) hours.   . potassium chloride (K-DUR) 10 MEQ tablet Take 20 mEq by mouth every morning.   . Tamsulosin HCl (FLOMAX) 0.4 MG CAPS Take 0.4 mg by mouth daily after breakfast.    No facility-administered encounter medications on file as of 07/22/2018.     PHYSICAL EXAM:   General: NAD, frail appearing, thin Pulmonary: unlabored Skin: no rashes Neurological: lethargy, nonverbal and bedbound at baseline  JOSIrean HongP

## 2018-07-23 ENCOUNTER — Telehealth: Payer: Self-pay | Admitting: Internal Medicine

## 2018-07-23 ENCOUNTER — Non-Acute Institutional Stay: Payer: Medicare Other | Admitting: Internal Medicine

## 2018-07-23 NOTE — Telephone Encounter (Signed)
10am: TC to Patient's father and HCPOA, Earlean ShawlRichard Nelligan Senior. Mr. Ferd GlassingWhittemore mentioned his understanding of current treatment plans including holding of patient's dilantin for critically high level, and Clysis therapy for elevated sodium levels. Mr. Lauralee EvenerWhittmore is hoping these modalities will affect change in son's clinical picture. He wishes to defer further discussion regarding feeding tubes, or hospice care, pending identification of how these therapies are working. I offered a facility visit to meet with him and he deferred. I tucked him into my schedule for next Thursday at 11am, but urged him to call our office if he changes his mind and wishes to see me today.  Holly BodilyMary Serpe NP-C

## 2018-07-29 ENCOUNTER — Non-Acute Institutional Stay: Payer: Medicare Other | Admitting: Internal Medicine

## 2018-07-29 DIAGNOSIS — Z515 Encounter for palliative care: Secondary | ICD-10-CM

## 2018-07-29 NOTE — Progress Notes (Signed)
    Brief visit to meet with PCG Mr. Jamarlon Hensler Senior, who confirms that patient was admitted to Hospice services this morning. Holly Bodily NP-C

## 2018-12-21 ENCOUNTER — Emergency Department (HOSPITAL_COMMUNITY)

## 2018-12-21 ENCOUNTER — Other Ambulatory Visit: Payer: Self-pay

## 2018-12-21 ENCOUNTER — Encounter (HOSPITAL_COMMUNITY): Payer: Self-pay | Admitting: Emergency Medicine

## 2018-12-21 ENCOUNTER — Inpatient Hospital Stay (HOSPITAL_COMMUNITY)
Admission: EM | Admit: 2018-12-21 | Discharge: 2018-12-24 | DRG: 543 | Disposition: A | Discharge status: Skilled Nursing Facility | Attending: Family Medicine | Admitting: Family Medicine

## 2018-12-21 DIAGNOSIS — F72 Severe intellectual disabilities: Secondary | ICD-10-CM | POA: Diagnosis present

## 2018-12-21 DIAGNOSIS — Z91011 Allergy to milk products: Secondary | ICD-10-CM

## 2018-12-21 DIAGNOSIS — Z7189 Other specified counseling: Secondary | ICD-10-CM

## 2018-12-21 DIAGNOSIS — Z888 Allergy status to other drugs, medicaments and biological substances status: Secondary | ICD-10-CM

## 2018-12-21 DIAGNOSIS — S72409A Unspecified fracture of lower end of unspecified femur, initial encounter for closed fracture: Secondary | ICD-10-CM | POA: Diagnosis present

## 2018-12-21 DIAGNOSIS — N132 Hydronephrosis with renal and ureteral calculous obstruction: Secondary | ICD-10-CM | POA: Diagnosis present

## 2018-12-21 DIAGNOSIS — Z515 Encounter for palliative care: Secondary | ICD-10-CM | POA: Diagnosis not present

## 2018-12-21 DIAGNOSIS — G40909 Epilepsy, unspecified, not intractable, without status epilepticus: Secondary | ICD-10-CM | POA: Diagnosis present

## 2018-12-21 DIAGNOSIS — E872 Acidosis: Secondary | ICD-10-CM | POA: Diagnosis present

## 2018-12-21 DIAGNOSIS — M84451A Pathological fracture, right femur, initial encounter for fracture: Principal | ICD-10-CM | POA: Diagnosis present

## 2018-12-21 DIAGNOSIS — Z7401 Bed confinement status: Secondary | ICD-10-CM

## 2018-12-21 DIAGNOSIS — S72401A Unspecified fracture of lower end of right femur, initial encounter for closed fracture: Secondary | ICD-10-CM

## 2018-12-21 DIAGNOSIS — Z79899 Other long term (current) drug therapy: Secondary | ICD-10-CM

## 2018-12-21 DIAGNOSIS — Z66 Do not resuscitate: Secondary | ICD-10-CM | POA: Diagnosis present

## 2018-12-21 DIAGNOSIS — M899 Disorder of bone, unspecified: Secondary | ICD-10-CM

## 2018-12-21 NOTE — ED Provider Notes (Signed)
[facility] Provider Note   CSN: [id number] Arrival date & time: 12/21/18  2230     History   Chief Complaint Chief Complaint  Patient presents with  . Hip Injury    HPI Cinda QuestRichard L Eller Jr. is a 63 y.o. male.  63 yo M with a chief complaint of right leg fracture.  The patient lives in an assisted living facility.  He has been there for the past 16 years.  Staff noted when they were rolling him that he had a deformity to the right leg.  They then discussed that he should be sent to the ED for evaluation.  No noted falls.  Patient is bedbound and occasionally is set up in a chair.  The history is provided by a caregiver.  Illness  Severity:  Moderate Onset quality:  Sudden Duration:  2 days Timing:  Constant Progression:  Worsening Chronicity:  New Associated symptoms: myalgias   Associated symptoms: no abdominal pain, no chest pain, no congestion, no diarrhea, no fever, no headaches, no rash, no shortness of breath and no vomiting     Past Medical History:  Diagnosis Date  . Fungemia 06/25/2012  . Healthcare-associated pneumonia 01/12/2012  . Hydronephrosis 01/12/2012  . Hyperpotassemia   . Intellectual disability   . Iron deficiency anemia 06/24/2012  . Metabolic encephalopathy   . Muscle weakness (generalized)   . Nephrolithiasis 01/12/2012  . Obesity, unspecified   . Other convulsions   . Other lymphedema   . Other physical therapy   . Ulcerative colitis, unspecified   . Unspecified constipation   . Unspecified intellectual disabilities     Patient Active Problem List   Diagnosis Date Noted  . Fungemia 06/25/2012  . Iron deficiency anemia 06/24/2012  . UTI (lower urinary tract infection) 06/23/2012  . Hypernatremia 01/13/2012  . Healthcare-associated pneumonia 01/12/2012  . Epilepsy (HCC) 01/12/2012  . Mental retardation 01/12/2012  . ARF (acute renal failure) (HCC) 01/12/2012  . Nephrolithiasis 01/12/2012  .  Hydronephrosis 01/12/2012  . C. difficile colitis 01/12/2012  . Hyperglycemia 01/12/2012    Past Surgical History:  Procedure Laterality Date  . CYSTOSCOPY W/ URETERAL STENT PLACEMENT  06/17/12  . CYSTOSCOPY W/ URETERAL STENT PLACEMENT  06/22/2012   Procedure: CYSTOSCOPY WITH STENT REPLACEMENT;  Surgeon: Anner CreteJohn J Wrenn, MD;  Location: [facility];  Service: Urology;  Laterality: Right;  . CYSTOSCOPY/RETROGRADE/URETEROSCOPY  06/22/2012   Procedure: CYSTOSCOPY/RETROGRADE/URETEROSCOPY;  Surgeon: Anner CreteJohn J Wrenn, MD;  Location: [facility];  Service: Urology;  Laterality: Right;  . fracture left femur     . fracture right arm     . NEPHROLITHOTOMY  02/05/2012   Procedure: NEPHROLITHOTOMY PERCUTANEOUS;  Surgeon: Anner CreteJohn J Wrenn, MD;  Location: [facility];  Service: Urology;  Laterality: Left;  left kidney        Home Medications    Prior to Admission medications   Medication Sig Start Date End Date Taking? Authorizing Provider  docusate sodium (COLACE) 250 MG capsule Take 250 mg by mouth 2 (two) times daily.    Yes [provider]  lactulose (CHRONULAC) 10 GM/15ML solution Take 30 g by mouth 4 (four) times daily.    Yes [provider]  levETIRAcetam (KEPPRA) 100 MG/ML solution Take 500 mg by mouth 2 (two) times daily.   Yes [provider]  PHENObarbital 20 MG/5ML elixir Take 160 mg by mouth every evening.   Yes [provider]  phenytoin (DILANTIN) 125 MG/5ML suspension Take 175 mg by mouth  every 12 (twelve) hours.    Yes [provider]  potassium chloride (MICRO-K) 10 MEQ CR capsule Take 20 mEq by mouth daily.   Yes [provider]  senna (SENOKOT) 8.6 MG TABS tablet Take 2 tablets by mouth at bedtime.   Yes [provider]  simethicone (MYLICON) 40 MG/0.6ML drops Take 120 mg by mouth 2 (two) times daily.   Yes [provider]  Tamsulosin HCl (FLOMAX) 0.4 MG CAPS Take 0.8 mg by mouth at bedtime.  05/21/12  Yes Forbes Cellar, MD  UNABLE  TO FIND Take 90 mLs by mouth 2 (two) times daily. Med Name: Medpass   Yes [provider]  albuterol (PROVENTIL) (2.5 MG/3ML) 0.083% nebulizer solution Take 2.5 mg by nebulization every 6 (six) hours as needed. For shortness of breath    [provider]    Family History No family history on file.  Social History Social History   Tobacco Use  . Smoking status: Never Smoker  . Smokeless tobacco: Never Used  Substance Use Topics  . Alcohol use: No  . Drug use: No     Allergies   Other   Review of Systems Review of Systems  Constitutional: Negative for chills and fever.  HENT: Negative for congestion and facial swelling.   Eyes: Negative for discharge and visual disturbance.  Respiratory: Negative for shortness of breath.   Cardiovascular: Negative for chest pain and palpitations.  Gastrointestinal: Negative for abdominal pain, diarrhea and vomiting.  Musculoskeletal: Positive for myalgias. Negative for arthralgias.  Skin: Negative for color change and rash.  Neurological: Negative for tremors, syncope and headaches.  Psychiatric/Behavioral: Negative for confusion and dysphoric mood.     Physical Exam Updated Vital Signs BP 120/82   Pulse (!) 108   Temp 99.9 F (37.7 C) (Axillary)   Resp (!) 22   SpO2 97%   Physical Exam Vitals signs and nursing note reviewed.  Constitutional:      Appearance: He is well-developed.  HENT:     Head: Normocephalic and atraumatic.  Eyes:     Pupils: Pupils are equal, round, and reactive to light.  Neck:     Musculoskeletal: Normal range of motion and neck supple.     Vascular: No JVD.  Cardiovascular:     Rate and Rhythm: Normal rate and regular rhythm.     Heart sounds: No murmur. No friction rub. No gallop.   Pulmonary:     Effort: No respiratory distress.     Breath sounds: No wheezing.  Abdominal:     General: There is no distension.     Tenderness: There is no guarding or rebound.  Musculoskeletal:  Normal range of motion.        General: Deformity present.     Comments: Deformity to the right thigh.  Intact pulse  Skin:    Coloration: Skin is not pale.     Findings: No rash.  Neurological:     Mental Status: He is alert.      ED Treatments / Results  Labs (all labs ordered are listed, but only abnormal results are displayed) Labs Reviewed  CBC WITH DIFFERENTIAL/PLATELET  BASIC METABOLIC PANEL    EKG EKG Interpretation  Date/Time:  Tuesday December 21 2018 23:56:31 EST Ventricular Rate:  106 PR Interval:    QRS Duration: 78 QT Interval:  349 QTC Calculation: 464 R Axis:   51 Text Interpretation:  Sinus tachycardia No significant change since last tracing Confirmed by Melene Plan [phone])  on 12/22/2018 12:17:26 AM   Radiology Dg Chest Port 1 View  Result Date: 12/21/2018 CLINICAL DATA:  Fall, femur fracture. EXAM: PORTABLE CHEST 1 VIEW COMPARISON:  06/24/2012 FINDINGS: Cardiomegaly. Low lung volumes. No confluent airspace opacities, effusions or edema. No acute bony abnormality. IMPRESSION: Low lung volumes.  Cardiomegaly.  No active disease. Electronically Signed   By: Charlett NoseKevin  Dover M.D.   On: 12/21/2018 23:24   Dg Femur Portable Min 2 Views Right  Result Date: 12/21/2018 CLINICAL DATA:  Right leg pain. EXAM: RIGHT FEMUR PORTABLE 2 VIEW COMPARISON:  None. FINDINGS: There is a comminuted fracture through the mid and distal right femur. Significant angulation and displacement of fracture fragments. Fracture against in the distal shaft and extends into the medial femoral condyle. IMPRESSION: Displaced, angulated comminuted distal right femoral fracture. Electronically Signed   By: Charlett NoseKevin  Dover M.D.   On: 12/21/2018 23:24    Procedures Procedures (including critical care time)  Medications Ordered in ED Medications - No data to display   Initial Impression / Assessment and Plan / ED Course  I have reviewed the triage vital signs and the nursing notes.  Pertinent labs  & imaging results that were available during my care of the patient were reviewed by me and considered in my medical decision making (see chart for details).     63 yo M with a significant past medical history of severe mental retardation in a skilled nursing facility bedbound came in with a chief complaint of right leg deformity.  Plain film shows a distal right femur fracture.  Will discuss with Ortho.  Discussed case with Dr. Charlann Boxerlin, orthopedics recommended knee immobilizer and Buck's traction.  Medicine admission.  They will evaluate at bedside in the morning.   The patients results and plan were reviewed and discussed.   Any x-rays performed were independently reviewed by myself.   Differential diagnosis were considered with the presenting HPI.  Medications - No data to display  Vitals:   12/21/18 2230 12/21/18 2345 12/21/18 2356 12/22/18 0000  BP: (!) 144/89 113/71 120/82   Pulse: (!) 109 (!) 102 (!) 108 (!) 108  Resp: 16  16 (!) 22  Temp: 99.9 F (37.7 C)     TempSrc: Axillary     SpO2: 97% 95% 95% 97%    Final diagnoses:  Closed fracture of distal end of right femur, unspecified fracture morphology, initial encounter (HCC)      Final Clinical Impressions(s) / ED Diagnoses   Final diagnoses:  Closed fracture of distal end of right femur, unspecified fracture morphology, initial encounter [facility](HCC)    ED Discharge Orders    None       Melene PlanFloyd, Hanan Moen, DO 12/22/18 0018

## 2018-12-21 NOTE — ED Triage Notes (Signed)
Pt to ED via [facility] from [facility] with a possible right femur fracture.   [facility] staff [facility] they noticed deformity to right femur earlier tonight.  Pt is non-verbal and non wt bearing.  Pt's father at bedside

## 2018-12-22 ENCOUNTER — Inpatient Hospital Stay (HOSPITAL_COMMUNITY)

## 2018-12-22 ENCOUNTER — Encounter (HOSPITAL_COMMUNITY): Payer: Self-pay | Admitting: Anesthesiology

## 2018-12-22 ENCOUNTER — Encounter (HOSPITAL_COMMUNITY)
Admission: EM | Disposition: A | Discharge status: Skilled Nursing Facility | Payer: Self-pay | Source: Home / Self Care | Attending: Family Medicine

## 2018-12-22 DIAGNOSIS — S72491A Other fracture of lower end of right femur, initial encounter for closed fracture: Secondary | ICD-10-CM

## 2018-12-22 DIAGNOSIS — Z66 Do not resuscitate: Secondary | ICD-10-CM | POA: Diagnosis present

## 2018-12-22 DIAGNOSIS — Z7189 Other specified counseling: Secondary | ICD-10-CM | POA: Diagnosis not present

## 2018-12-22 DIAGNOSIS — N132 Hydronephrosis with renal and ureteral calculous obstruction: Secondary | ICD-10-CM | POA: Diagnosis present

## 2018-12-22 DIAGNOSIS — Z7401 Bed confinement status: Secondary | ICD-10-CM | POA: Diagnosis not present

## 2018-12-22 DIAGNOSIS — M84451A Pathological fracture, right femur, initial encounter for fracture: Secondary | ICD-10-CM | POA: Diagnosis present

## 2018-12-22 DIAGNOSIS — E872 Acidosis: Secondary | ICD-10-CM | POA: Diagnosis present

## 2018-12-22 DIAGNOSIS — Z91011 Allergy to milk products: Secondary | ICD-10-CM | POA: Diagnosis not present

## 2018-12-22 DIAGNOSIS — F72 Severe intellectual disabilities: Secondary | ICD-10-CM | POA: Diagnosis present

## 2018-12-22 DIAGNOSIS — Z888 Allergy status to other drugs, medicaments and biological substances status: Secondary | ICD-10-CM | POA: Diagnosis not present

## 2018-12-22 DIAGNOSIS — S72409A Unspecified fracture of lower end of unspecified femur, initial encounter for closed fracture: Secondary | ICD-10-CM | POA: Diagnosis present

## 2018-12-22 DIAGNOSIS — Z79899 Other long term (current) drug therapy: Secondary | ICD-10-CM | POA: Diagnosis not present

## 2018-12-22 DIAGNOSIS — Z515 Encounter for palliative care: Secondary | ICD-10-CM | POA: Diagnosis not present

## 2018-12-22 DIAGNOSIS — M899 Disorder of bone, unspecified: Secondary | ICD-10-CM | POA: Diagnosis not present

## 2018-12-22 DIAGNOSIS — G40909 Epilepsy, unspecified, not intractable, without status epilepticus: Secondary | ICD-10-CM | POA: Diagnosis present

## 2018-12-22 LAB — CBC WITH DIFFERENTIAL/PLATELET
Abs Immature Granulocytes: 0.04 10*3/uL (ref 0.00–0.07)
BASOS ABS: 0.1 10*3/uL (ref 0.0–0.1)
BASOS PCT: 1 %
EOS ABS: 0.3 10*3/uL (ref 0.0–0.5)
EOS PCT: 3 %
HEMATOCRIT: 38.8 % — AB (ref 39.0–52.0)
Hemoglobin: 12.6 g/dL — ABNORMAL LOW (ref 13.0–17.0)
Immature Granulocytes: 0 %
LYMPHS ABS: 1.1 10*3/uL (ref 0.7–4.0)
Lymphocytes Relative: 11 %
MCH: 32.8 pg (ref 26.0–34.0)
MCHC: 32.5 g/dL (ref 30.0–36.0)
MCV: 101 fL — AB (ref 80.0–100.0)
MONOS PCT: 8 %
Monocytes Absolute: 0.8 10*3/uL (ref 0.1–1.0)
Neutro Abs: 7.8 10*3/uL — ABNORMAL HIGH (ref 1.7–7.7)
Neutrophils Relative %: 77 %
Platelets: 319 10*3/uL (ref 150–400)
RBC: 3.84 MIL/uL — ABNORMAL LOW (ref 4.22–5.81)
RDW: 14.6 % (ref 11.5–15.5)
WBC: 10.1 10*3/uL (ref 4.0–10.5)
nRBC: 0 % (ref 0.0–0.2)

## 2018-12-22 LAB — BASIC METABOLIC PANEL
Anion gap: 10 (ref 5–15)
Anion gap: 9 (ref 5–15)
BUN: 6 mg/dL (ref 6–20)
BUN: 8 mg/dL (ref 6–20)
CALCIUM: 8.3 mg/dL — AB (ref 8.9–10.3)
CO2: 24 mmol/L (ref 22–32)
CO2: 24 mmol/L (ref 22–32)
CREATININE: 0.52 mg/dL — AB (ref 0.61–1.24)
Calcium: 8.3 mg/dL — ABNORMAL LOW (ref 8.9–10.3)
Chloride: 105 mmol/L (ref 98–111)
Chloride: 107 mmol/L (ref 98–111)
Creatinine, Ser: 0.49 mg/dL — ABNORMAL LOW (ref 0.61–1.24)
GFR calc Af Amer: >60 mL/min (ref >60)
GFR calc Af Amer: >60 mL/min (ref >60)
GFR calc non Af Amer: >60 mL/min (ref >60)
GFR calc non Af Amer: >60 mL/min (ref >60)
GLUCOSE: 160 mg/dL — AB (ref 70–99)
Glucose, Bld: 121 mg/dL — ABNORMAL HIGH (ref 70–99)
Potassium: 3.5 mmol/L (ref 3.5–5.1)
Potassium: 3.7 mmol/L (ref 3.5–5.1)
Sodium: 139 mmol/L (ref 135–145)
Sodium: 140 mmol/L (ref 135–145)

## 2018-12-22 LAB — CBC
HCT: 41.2 % (ref 39.0–52.0)
Hemoglobin: 12.8 g/dL — ABNORMAL LOW (ref 13.0–17.0)
MCH: 31.1 pg (ref 26.0–34.0)
MCHC: 31.1 g/dL (ref 30.0–36.0)
MCV: 100.2 fL — ABNORMAL HIGH (ref 80.0–100.0)
NRBC: 0 % (ref 0.0–0.2)
Platelets: 311 10*3/uL (ref 150–400)
RBC: 4.11 MIL/uL — AB (ref 4.22–5.81)
RDW: 14.5 % (ref 11.5–15.5)
WBC: 9.7 10*3/uL (ref 4.0–10.5)

## 2018-12-22 LAB — SURGICAL PCR SCREEN
MRSA, PCR: NEGATIVE
Staphylococcus aureus: POSITIVE — AB

## 2018-12-22 SURGERY — INSERTION, INTRAMEDULLARY ROD, FEMUR, RETROGRADE
Anesthesia: Choice | Laterality: Right

## 2018-12-22 MED ORDER — CHLORHEXIDINE GLUCONATE 4 % EX LIQD
60.0000 mL | Freq: Once | CUTANEOUS | Status: AC
  Administered 2018-12-22: 4 via TOPICAL
  Filled 2018-12-22: qty 60

## 2018-12-22 MED ORDER — ONDANSETRON HCL 4 MG PO TABS: 4.0000 mg | ORAL_TABLET | Freq: Four times a day (QID) | ORAL | Status: DC | PRN

## 2018-12-22 MED ORDER — MUPIROCIN 2 % EX OINT
1.0000 "application " | TOPICAL_OINTMENT | Freq: Two times a day (BID) | CUTANEOUS | Status: DC
  Administered 2018-12-22 – 2018-12-24 (×5): 1 via TOPICAL
  Filled 2018-12-22 (×3): qty 22

## 2018-12-22 MED ORDER — LACTATED RINGERS IV SOLN
INTRAVENOUS | Status: AC
  Administered 2018-12-22 – 2018-12-23 (×3): via INTRAVENOUS

## 2018-12-22 MED ORDER — POVIDONE-IODINE 10 % EX SWAB: 2.0000 "application " | Freq: Once | CUTANEOUS | Status: DC

## 2018-12-22 MED ORDER — DOCUSATE SODIUM 50 MG PO CAPS
250.0000 mg | ORAL_CAPSULE | Freq: Two times a day (BID) | ORAL | Status: DC
  Administered 2018-12-22 – 2018-12-23 (×2): 250 mg via ORAL
  Filled 2018-12-22 (×5): qty 1

## 2018-12-22 MED ORDER — PHENOBARBITAL 20 MG/5ML PO ELIX
160.0000 mg | ORAL_SOLUTION | Freq: Every evening | ORAL | Status: DC
  Filled 2018-12-22 (×2): qty 45

## 2018-12-22 MED ORDER — PHENYTOIN 125 MG/5ML PO SUSP
175.0000 mg | Freq: Two times a day (BID) | ORAL | Status: DC
  Administered 2018-12-22 (×2): 175 mg via ORAL
  Filled 2018-12-22 (×3): qty 8

## 2018-12-22 MED ORDER — POLYETHYLENE GLYCOL 3350 17 G PO PACK
17.0000 g | PACK | Freq: Every day | ORAL | Status: DC
  Administered 2018-12-23 – 2018-12-24 (×2): 17 g via ORAL
  Filled 2018-12-22 (×3): qty 1

## 2018-12-22 MED ORDER — ACETAMINOPHEN 325 MG PO TABS
650.0000 mg | ORAL_TABLET | Freq: Four times a day (QID) | ORAL | Status: DC
  Administered 2018-12-22 – 2018-12-23 (×4): 650 mg via ORAL
  Filled 2018-12-22 (×4): qty 2

## 2018-12-22 MED ORDER — ONDANSETRON HCL 4 MG/2ML IJ SOLN: 4.0000 mg | Freq: Four times a day (QID) | INTRAMUSCULAR | Status: DC | PRN

## 2018-12-22 MED ORDER — TAMSULOSIN HCL 0.4 MG PO CAPS
0.8000 mg | ORAL_CAPSULE | Freq: Every day | ORAL | Status: DC
  Administered 2018-12-22: 0.8 mg via ORAL
  Filled 2018-12-22 (×2): qty 2

## 2018-12-22 MED ORDER — ACETAMINOPHEN 325 MG PO TABS: 650.0000 mg | ORAL_TABLET | Freq: Four times a day (QID) | ORAL | Status: DC | PRN

## 2018-12-22 MED ORDER — SENNA 8.6 MG PO TABS
2.0000 | ORAL_TABLET | Freq: Every day | ORAL | Status: DC
  Administered 2018-12-22: 17.2 mg via ORAL
  Filled 2018-12-22 (×2): qty 2

## 2018-12-22 MED ORDER — OXYCODONE HCL 5 MG PO TABS: 5.0000 mg | ORAL_TABLET | Freq: Four times a day (QID) | ORAL | Status: DC | PRN

## 2018-12-22 MED ORDER — LEVETIRACETAM 100 MG/ML PO SOLN
500.0000 mg | Freq: Two times a day (BID) | ORAL | Status: DC
  Administered 2018-12-22 (×2): 500 mg via ORAL
  Filled 2018-12-22 (×3): qty 5

## 2018-12-22 MED ORDER — ADULT MULTIVITAMIN W/MINERALS CH
1.0000 | ORAL_TABLET | Freq: Every day | ORAL | Status: DC
  Administered 2018-12-22 – 2018-12-23 (×2): 1 via ORAL
  Filled 2018-12-22 (×2): qty 1

## 2018-12-22 MED ORDER — ACETAMINOPHEN 650 MG RE SUPP: 650.0000 mg | Freq: Four times a day (QID) | RECTAL | Status: DC | PRN

## 2018-12-22 MED ORDER — ALBUTEROL SULFATE (2.5 MG/3ML) 0.083% IN NEBU: 2.5000 mg | INHALATION_SOLUTION | Freq: Four times a day (QID) | RESPIRATORY_TRACT | Status: DC | PRN

## 2018-12-22 NOTE — ED Notes (Signed)
Ortho tech paged and instructed to respond to 6N for knee immobilizer and traction.

## 2018-12-22 NOTE — Progress Notes (Signed)
PROGRESS NOTE    Charles Questichard L Sky Jr.  UXL:[id number] DOB: 08/16/1956 DOA: 12/21/2018 PCP: Kaleen MaskElkins, Wilson Oliver, MD  Brief Narrative:  Charles Questichard L Games Jr. is a 63 y.o. male with medical history significant for severe intellectual disability and epilepsy who presents to the ED from [facility] for evaluation of suspected right lower extremity fracture.  Patient is nonverbal and unable to provide any history, therefore entirety of history is obtained from patient's father at bedside and chart review.  Patient is chronically bedbound [facility]  Per ED documentation, when staff were rolling him in bed they noticed a deformity to his right leg and due to concern for fracture he was sent to the ED.  There is no report of fall.  Per father, he is occasionally set up in a chair and transfers with use of a Hoyer lift.  Assessment & Plan:   Principal Problem:   Closed fracture of distal end of femur (HCC) Active Problems:   Epilepsy (HCC)   Charles Clovia CuffL Byas Jr. is a 63 y.o. male with medical history significant for severe intellectual disability, chronically bedbound, and epilepsy who is admitted for closed fracture of the right femur.  Displaced closed fracture of distal end of femur: Unclear etiology, no report of fall, patient chronically bedbound. -Knee mobilization and Buck's traction ordered -orthopedics consulted and to see in a.m. -> planning to discuss with trauma service -N.p.o. except sips with meds -Scheduled apap and oxy prn for pain.  Bowel regimen.  Epilepsy: Continue home Keppra, phenobarbital, phenytoin.  Seizure precautions.  Of note, pt followed by Hospice in outpatient setting  DVT prophylaxis: SCD Code Status: DNR Family Communication: father over phone Disposition Plan: pending orthopedic intervention  Consultants:   orthopedics  Procedures:   none  Antimicrobials:  Anti-infectives (From  admission, onward)   None        Subjective: Nonverbal  Discussed with father over phone  Objective: Vitals:   12/22/18 0215 12/22/18 0230 12/22/18 0317 12/22/18 0601  BP:  116/74 139/90 129/85  Pulse: 99 (!) 111 (!) 103 (!) 107  Resp: 19 (!) 21 (!) 22 18  Temp:  98.5 F (36.9 C) 97.7 F (36.5 C) (!) 97.5 F (36.4 C)  TempSrc:  Oral Oral Oral  SpO2: 96% 97% 100% 97%  Weight:   77.9 kg   Height:   5' 7.2" (1.707 m)    No intake or output data in the 24 hours ending 12/22/18 1252 Filed Weights   12/22/18 0317  Weight: 77.9 kg    Examination:  General exam: Appears calm and comfortable  Respiratory system: Clear to auscultation. Respiratory effort normal. Cardiovascular system: S1 & S2 heard, RRR. Gastrointestinal system: Abdomen is nondistended, soft and nontender. Central nervous system: Nonverbal, does not follow commands, moving all extremities spontaneously (except RLE which is in traction) Extremities: RLE in traction Skin: No rashes, lesions or ulcers Psychiatry: Judgement and insight appear normal. Mood & affect appropriate.     Data Reviewed: I have personally reviewed following labs and imaging studies  CBC: Recent Labs  Lab 12/22/18 0021 12/22/18 0746  WBC 10.1 9.7  NEUTROABS 7.8*  --   HGB 12.6* 12.8*  HCT 38.8* 41.2  MCV 101.0* 100.2*  PLT 319 311   Basic Metabolic Panel: Recent Labs  Lab 12/22/18 0021 12/22/18 0746  NA 139 140  K 3.5 3.7  CL 105 107  CO2 24 24  GLUCOSE 160* 121*  BUN  8 6  CREATININE 0.52* 0.49*  CALCIUM 8.3* 8.3*   GFR: Estimated Creatinine Clearance: 92.5 mL/min (A) (by C-G formula based on SCr of 0.49 mg/dL (L)). Liver Function Tests: No results for input(s): AST, ALT, ALKPHOS, BILITOT, PROT, ALBUMIN in the last 168 hours. No results for input(s): LIPASE, AMYLASE in the last 168 hours. No results for input(s): AMMONIA in the last 168 hours. Coagulation Profile: No results for input(s): INR, PROTIME in the  last 168 hours. Cardiac Enzymes: No results for input(s): CKTOTAL, CKMB, CKMBINDEX, TROPONINI in the last 168 hours. BNP (last 3 results) No results for input(s): PROBNP in the last 8760 hours. HbA1C: No results for input(s): HGBA1C in the last 72 hours. CBG: No results for input(s): GLUCAP in the last 168 hours. Lipid Profile: No results for input(s): CHOL, HDL, LDLCALC, TRIG, CHOLHDL, LDLDIRECT in the last 72 hours. Thyroid Function Tests: No results for input(s): TSH, T4TOTAL, FREET4, T3FREE, THYROIDAB in the last 72 hours. Anemia Panel: No results for input(s): VITAMINB12, FOLATE, FERRITIN, TIBC, IRON, RETICCTPCT in the last 72 hours. Sepsis Labs: No results for input(s): PROCALCITON, LATICACIDVEN in the last 168 hours.  Recent Results (from the past 240 hour(s))  Surgical pcr screen     Status: Abnormal   Collection Time: 12/22/18  3:32 AM  Result Value Ref Range Status   MRSA, PCR NEGATIVE NEGATIVE Final   Staphylococcus aureus POSITIVE (A) NEGATIVE Final    Comment: (NOTE) The Xpert SA Assay (FDA approved for NASAL specimens in patients 39 years of age and older), is one component of a comprehensive surveillance program. It is not intended to diagnose infection nor to guide or monitor treatment. Performed at [facility], [address] [place], [place] [address]          Radiology Studies: Dg Chest Port 1 View  Result Date: 12/21/2018 CLINICAL DATA:  Fall, femur fracture. EXAM: PORTABLE CHEST 1 VIEW COMPARISON:  06/24/2012 FINDINGS: Cardiomegaly. Low lung volumes. No confluent airspace opacities, effusions or edema. No acute bony abnormality. IMPRESSION: Low lung volumes.  Cardiomegaly.  No active disease. Electronically Signed   By: Charlett Nose M.D.   On: 12/21/2018 23:24   Dg Femur Portable Min 2 Views Right  Result Date: 12/21/2018 CLINICAL DATA:  Right leg pain. EXAM: RIGHT FEMUR PORTABLE 2 VIEW COMPARISON:  None. FINDINGS: There is a comminuted  fracture through the mid and distal right femur. Significant angulation and displacement of fracture fragments. Fracture against in the distal shaft and extends into the medial femoral condyle. IMPRESSION: Displaced, angulated comminuted distal right femoral fracture. Electronically Signed   By: Charlett Nose M.D.   On: 12/21/2018 23:24        Scheduled Meds: . acetaminophen  650 mg Oral Q6H  . docusate sodium  250 mg Oral BID  . levETIRAcetam  500 mg Oral BID  . mupirocin ointment  1 application Topical BID  . PHENObarbital  160 mg Oral QPM  . phenytoin  175 mg Oral Q12H  . polyethylene glycol  17 g Oral Daily  . povidone-iodine  2 application Topical Once  . senna  2 tablet Oral QHS  . tamsulosin  0.8 mg Oral QHS   Continuous Infusions: . lactated ringers 100 mL/hr at 12/22/18 1051     LOS: 0 days    Time spent: over 30 min    Lacretia Nicks, MD [facility] Pager AMION  If 7PM-7AM, please contact night-coverage [email or web address] Password [facility] 12/22/2018, 12:52 PM

## 2018-12-22 NOTE — ED Notes (Signed)
Hospital bed ordered.

## 2018-12-22 NOTE — Consult Note (Signed)
Reason for Consult: Right distal femur fracture, closed Referring Physician: Emergency room physicians and Lacretia Nicks, MD  [name] is an 63 y.o. male.  HPI: Charles Humphrey. is a 63 y.o. male with medical history significant for severe intellectual disability and epilepsy who presents to the ED from [facility] for evaluation of suspected right lower extremity fracture.  Patient is nonverbal and unable to provide any history, therefore entirety of history is obtained from patient's father at bedside and chart review.  Patient is chronically bedbound and is a resident at [facility].  Per ED documentation, when staff were rolling him in bed they noticed a deformity to his right leg and due to concern for fracture he was sent to the ED.  There is no report of fall.  Per father, he is occasionally set up in a chair and transfers with use of a Hoyer lift.  Patient seen and evaluated this morning.  His father and room with him.  Above history confirmed.  Uncertain of the nature of the injury.  Past Medical History:  Diagnosis Date  . Fungemia 06/25/2012  . Healthcare-associated pneumonia 01/12/2012  . Hydronephrosis 01/12/2012  . Hyperpotassemia   . Intellectual disability   . Iron deficiency anemia 06/24/2012  . Metabolic encephalopathy   . Muscle weakness (generalized)   . Nephrolithiasis 01/12/2012  . Obesity, unspecified   . Other convulsions   . Other lymphedema   . Other physical therapy   . Ulcerative colitis, unspecified   . Unspecified constipation   . Unspecified intellectual disabilities     Past Surgical History:  Procedure Laterality Date  . CYSTOSCOPY W/ URETERAL STENT PLACEMENT  06/17/12  . CYSTOSCOPY W/ URETERAL STENT PLACEMENT  06/22/2012   Procedure: CYSTOSCOPY WITH STENT REPLACEMENT;  Surgeon: Anner [place], MD;  Location: [facility];  Service: Urology;  Laterality: Right;  . CYSTOSCOPY/RETROGRADE/URETEROSCOPY  06/22/2012    Procedure: CYSTOSCOPY/RETROGRADE/URETEROSCOPY;  Surgeon: Anner [place], MD;  Location: [facility];  Service: Urology;  Laterality: Right;  . fracture left femur     . fracture right arm     . NEPHROLITHOTOMY  02/05/2012   Procedure: NEPHROLITHOTOMY PERCUTANEOUS;  Surgeon: Anner [place], MD;  Location: [facility];  Service: Urology;  Laterality: Left;  left kidney    No family history on file.  Social History:  reports that he has never smoked. He has never used smokeless tobacco. He reports that he does not drink alcohol or use drugs.  Allergies:  Allergies  Allergen Reactions  . Other Other (See Comments)    No dairy products    Medications:  I have reviewed the patient's current medications. Scheduled: . docusate sodium  250 mg Oral BID  . levETIRAcetam  500 mg Oral BID  . PHENObarbital  160 mg Oral QPM  . phenytoin  175 mg Oral Q12H  . senna  2 tablet Oral QHS  . tamsulosin  0.8 mg Oral QHS    Results for orders placed or performed during the hospital encounter of 12/21/18 (from the past 24 hour(s))  CBC with Differential     Status: Abnormal   Collection Time: 12/22/18 12:21 AM  Result Value Ref Range   WBC 10.1 4.0 - 10.5 K/uL   RBC 3.84 (L) 4.22 - 5.81 MIL/uL   Hemoglobin 12.6 (L) 13.0 - 17.0 g/dL   HCT 65.7 (L) 84.6 - 96.2 %   MCV 101.0 (H) 80.0 - 100.0 fL   MCH 32.8  26.0 - 34.0 pg   MCHC 32.5 30.0 - 36.0 g/dL   RDW 16.114.6 09.611.5 - 04.515.5 %   Platelets 319 150 - 400 K/uL   nRBC 0.0 0.0 - 0.2 %   Neutrophils Relative % 77 %   Neutro Abs 7.8 (H) 1.7 - 7.7 K/uL   Lymphocytes Relative 11 %   Lymphs Abs 1.1 0.7 - 4.0 K/uL   Monocytes Relative 8 %   Monocytes Absolute 0.8 0.1 - 1.0 K/uL   Eosinophils Relative 3 %   Eosinophils Absolute 0.3 0.0 - 0.5 K/uL   Basophils Relative 1 %   Basophils Absolute 0.1 0.0 - 0.1 K/uL   Immature Granulocytes 0 %   Abs Immature Granulocytes 0.04 0.00 - 0.07 K/uL  Basic metabolic panel     Status: Abnormal   Collection Time: 12/22/18 12:21 AM   Result Value Ref Range   Sodium 139 135 - 145 mmol/L   Potassium 3.5 3.5 - 5.1 mmol/L   Chloride 105 98 - 111 mmol/L   CO2 24 22 - 32 mmol/L   Glucose, Bld 160 (H) 70 - 99 mg/dL   BUN 8 6 - 20 mg/dL   Creatinine, Ser 4.090.52 (L) 0.61 - 1.24 mg/dL   Calcium 8.3 (L) 8.9 - 10.3 mg/dL   GFR calc non Af Amer >60 >60 mL/min   GFR calc Af Amer >60 >60 mL/min   Anion gap 10 5 - 15  Surgical pcr screen     Status: Abnormal   Collection Time: 12/22/18  3:32 AM  Result Value Ref Range   MRSA, PCR NEGATIVE NEGATIVE   Staphylococcus aureus POSITIVE (A) NEGATIVE    X-ray: CLINICAL DATA:  Right leg pain.  EXAM: RIGHT FEMUR PORTABLE 2 VIEW  COMPARISON:  None.  FINDINGS: There is a comminuted fracture through the mid and distal right femur. Significant angulation and displacement of fracture fragments. Fracture against in the distal shaft and extends into the medial femoral condyle.  IMPRESSION: Displaced, angulated comminuted distal right femoral fracture.   Electronically Signed   By: Charlett NoseKevin  Dover M.D.  ROS  Obtained from HPI and father otherwise as noted based on his admitting history and physical  Blood pressure 129/85, pulse (!) 107, temperature (!) 97.5 F (36.4 C), temperature source Oral, resp. rate 18, height 5' 7.2" (1.707 m), weight 77.9 kg, SpO2 97 %.  Physical Exam  Awake nonverbal in bed with traction applied to his right lower extremity Palpable pulses distally No other bruising noted in his upper extremity or left lower extremity  General medical exam reviewed for pertinent findings.  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  I discussed with his father that we will try to find assistance in managing this fracture for pain control and continued limited mobility via transfers to chair.  The alternative is nonoperative care which would require  long-term bed restriction.  I will be contacting the trauma service to assist in definitive management.  NPO for the time being until we know for certain when the procedure may take place.  Shelda PalMatthew D Marleny Faller 12/22/2018, 7:36 AM

## 2018-12-22 NOTE — Progress Notes (Addendum)
[facility]  GIP RN Visit  This is a covered and related GIP visit with a hospice diagnosis of protein calorie malnutrition. Pt is from [facility], staff was repositioning him in the bed and noticed his leg appeared to be fractured.  No reported fall or other signs of trauma. Staff activated EMS and he was transferred to [facility] for evaluation.  He is admitted with closed femur fracture.  Spoke with father, visited pt at the bedside.  Pt alert and looking around, non-verbal at baseline, appears comfortable. Pt in bucks traction 5#'s.  Plan is for IM nailing, initially scheduled for 1/29 @ 3pm, has been postponed.  Pain assessment based on Faces score is 0.  Awaiting trauma services consult.  V/S:  97.5 oral, 129/85, HR 107, RR 18, SPO2 97% RA  Diagnostics:  Portable femur: IMPRESSION: Displaced, angulated comminuted distal right femoral fracture.  Labwork: unremarkable  Continuous infusions, PRNs:  LR @ 124ml/hr  Femur Fracture:  Currently on traction, 5#s.  IM nailing was scheduled for today, has been postponed, awaiting trauma services consult.  Per ortho notes: Right distal femur fracture in setting of disuse osteopenia/osteoporosis.  Discharge planning:  ongoing  PCG:  Updated father multiple times via phone  IDT: Updated  Transfer summary and medication list placed on shadow chart  Once ready for discharge, please use [facility] for ambulance transport as they contract this service for [facility].  Thank you, Wallis Bamberg BSN, RN [facility] Liaison (listed in [place]) [phone]

## 2018-12-22 NOTE — Plan of Care (Signed)
  Problem: Clinical Measurements: Goal: Will remain free from infection Outcome: Progressing   Problem: Activity: Goal: Risk for activity intolerance will decrease Outcome: Progressing   Problem: Nutrition: Goal: Adequate nutrition will be maintained Outcome: Progressing   Problem: Coping: Goal: Level of anxiety will decrease Outcome: Progressing   Problem: Elimination: Goal: Will not experience complications related to bowel motility Outcome: Progressing Goal: Will not experience complications related to urinary retention Outcome: Progressing   Problem: Pain Managment: Goal: General experience of comfort will improve Outcome: Progressing   Problem: Safety: Goal: Ability to remain free from injury will improve Outcome: Progressing   Problem: Skin Integrity: Goal: Risk for impaired skin integrity will decrease Outcome: Progressing   

## 2018-12-22 NOTE — Progress Notes (Signed)
Patient and family arrived to 6N02 at this time. Oriented to room and surroundings.

## 2018-12-22 NOTE — H&P (Signed)
History and Physical    Charles Humphrey. YIR:[id number] DOB: 07/14/1956 DOA: 12/21/2018  PCP: Kaleen Mask, MD  Patient coming from: [facility]  I have personally briefly reviewed patient's old medical records in [facility]  Chief Complaint: Right femur fracture  HPI: Charles Humphrey. is a 63 y.o. male with medical history significant for severe intellectual disability and epilepsy who presents to the ED from [facility] for evaluation of suspected right lower extremity fracture.  Patient is nonverbal and unable to provide any history, therefore entirety of history is obtained from patient's father at bedside and chart review.  Patient is chronically bedbound and is a resident at [facility] nursing facility.  Per ED documentation, when staff were rolling him in bed they noticed a deformity to his right leg and due to concern for fracture he was sent to the ED.  There is no report of fall.  Per father, he is occasionally set up in a chair and transfers with use of a Hoyer lift.  ED Course:  Initial vitals showed BP 144/89, pulse 109, RR 16, temp 99.9 Fahrenheit, SPO2 97% on room air. Labs notable for WBC 10.1, hemoglobin 12.6, platelets 318, BUN, creatinine 0.52.  Portable chest x-ray showed low lung volumes without focal consolidation, effusions, or edema. Right femur x-ray showed displaced, angulated comminuted distal right femoral fracture.  Orthopedics, Dr. Charlann Boxer, was consulted and recommended immobilizer, Buck's traction, and medical admission and will see in the morning.   Review of Systems: Unable to obtain from patient due to severe intellectual disability.   Past Medical History:  Diagnosis Date  . Fungemia 06/25/2012  . Healthcare-associated pneumonia 01/12/2012  . Hydronephrosis 01/12/2012  . Hyperpotassemia   . Intellectual disability   . Iron deficiency anemia 06/24/2012  . Metabolic encephalopathy   . Muscle weakness  (generalized)   . Nephrolithiasis 01/12/2012  . Obesity, unspecified   . Other convulsions   . Other lymphedema   . Other physical therapy   . Ulcerative colitis, unspecified   . Unspecified constipation   . Unspecified intellectual disabilities     Past Surgical History:  Procedure Laterality Date  . CYSTOSCOPY W/ URETERAL STENT PLACEMENT  06/17/12  . CYSTOSCOPY W/ URETERAL STENT PLACEMENT  06/22/2012   Procedure: CYSTOSCOPY WITH STENT REPLACEMENT;  Surgeon: Anner Crete, MD;  Location: [facility];  Service: Urology;  Laterality: Right;  . CYSTOSCOPY/RETROGRADE/URETEROSCOPY  06/22/2012   Procedure: CYSTOSCOPY/RETROGRADE/URETEROSCOPY;  Surgeon: Anner Crete, MD;  Location: [facility];  Service: Urology;  Laterality: Right;  . fracture left femur     . fracture right arm     . NEPHROLITHOTOMY  02/05/2012   Procedure: NEPHROLITHOTOMY PERCUTANEOUS;  Surgeon: Anner Crete, MD;  Location: [facility];  Service: Urology;  Laterality: Left;  left kidney     reports that he has never smoked. He has never used smokeless tobacco. He reports that he does not drink alcohol or use drugs.  Allergies  Allergen Reactions  . Other Other (See Comments)    No dairy products    No family history on file.   Prior to Admission medications   Medication Sig Start Date End Date Taking? Authorizing Provider  docusate sodium (COLACE) 250 MG capsule Take 250 mg by mouth 2 (two) times daily.    Yes [provider]  lactulose (CHRONULAC) 10 GM/15ML solution Take 30 g by mouth 4 (four) times daily.    Yes [provider]  levETIRAcetam (  KEPPRA) 100 MG/ML solution Take 500 mg by mouth 2 (two) times daily.   Yes [provider]  PHENObarbital 20 MG/5ML elixir Take 160 mg by mouth every evening.   Yes [provider]  phenytoin (DILANTIN) 125 MG/5ML suspension Take 175 mg by mouth every 12 (twelve) hours.    Yes [provider]  potassium chloride (MICRO-K) 10 MEQ CR capsule Take  20 mEq by mouth daily.   Yes [provider]  senna (SENOKOT) 8.6 MG TABS tablet Take 2 tablets by mouth at bedtime.   Yes [provider]  simethicone (MYLICON) 40 MG/0.6ML drops Take 120 mg by mouth 2 (two) times daily.   Yes [provider]  Tamsulosin HCl (FLOMAX) 0.4 MG CAPS Take 0.8 mg by mouth at bedtime.  05/21/12  Yes Forbes Cellar, MD  UNABLE TO FIND Take 90 mLs by mouth 2 (two) times daily. Med Name: Medpass   Yes [provider]  albuterol (PROVENTIL) (2.5 MG/3ML) 0.083% nebulizer solution Take 2.5 mg by nebulization every 6 (six) hours as needed. For shortness of breath    [provider]    Physical Exam: Vitals:   12/22/18 0100 12/22/18 0115 12/22/18 0130 12/22/18 0145  BP:      Pulse: 100 (!) 106 (!) 102 95  Resp: 16 12 15 16   Temp:      TempSrc:      SpO2: 98% 98% 96% 96%    Constitutional: Resting in bed, NAD, appears calm and comfortable, not interactive or following commands Eyes: PERRL, lids and conjunctivae normal ENMT: Mucous membranes are moist. Neck: normal, supple, no masses. Respiratory: clear to auscultation anteriorly, no appreciable wheezing or crackles. Normal respiratory effort. No accessory muscle use.  Cardiovascular: Regular rate and rhythm, no murmurs / rubs / gallops. No extremity edema. Abdomen: no tenderness, no masses palpated. No hepatosplenomegaly. Bowel sounds positive.  Musculoskeletal: Right hip externally rotated with knee in flexion and right foot everted.  Moves upper extremities spontaneously with normal muscle tone on passive range of motion. Skin: no rashes, lesions, ulcers. No induration Neurologic: Exam limited due to patient not following commands or communicating.  Moves upper extremities spontaneously. Psychiatric: Sleepy but opens eyes to voice and touch.  Unable to answer formal orientation questions.    Labs on Admission: I have personally reviewed following labs and imaging  studies  CBC: Recent Labs  Lab 12/22/18 0021  WBC 10.1  NEUTROABS 7.8*  HGB 12.6*  HCT 38.8*  MCV 101.0*  PLT 319   Basic Metabolic Panel: Recent Labs  Lab 12/22/18 0021  NA 139  K 3.5  CL 105  CO2 24  GLUCOSE 160*  BUN 8  CREATININE 0.52*  CALCIUM 8.3*   GFR: CrCl cannot be calculated (Unknown ideal weight.). Liver Function Tests: No results for input(s): AST, ALT, ALKPHOS, BILITOT, PROT, ALBUMIN in the last 168 hours. No results for input(s): LIPASE, AMYLASE in the last 168 hours. No results for input(s): AMMONIA in the last 168 hours. Coagulation Profile: No results for input(s): INR, PROTIME in the last 168 hours. Cardiac Enzymes: No results for input(s): CKTOTAL, CKMB, CKMBINDEX, TROPONINI in the last 168 hours. BNP (last 3 results) No results for input(s): PROBNP in the last 8760 hours. HbA1C: No results for input(s): HGBA1C in the last 72 hours. CBG: No results for input(s): GLUCAP in the last 168 hours. Lipid Profile: No results for input(s): CHOL, HDL, LDLCALC, TRIG, CHOLHDL, LDLDIRECT in the last 72 hours. Thyroid Function Tests:  No results for input(s): TSH, T4TOTAL, FREET4, T3FREE, THYROIDAB in the last 72 hours. Anemia Panel: No results for input(s): VITAMINB12, FOLATE, FERRITIN, TIBC, IRON, RETICCTPCT in the last 72 hours. Urine analysis:    Component Value Date/Time   COLORURINE AMBER (A) 06/23/2012 0426   APPEARANCEUR TURBID (A) 06/23/2012 0426   LABSPEC 1.022 06/23/2012 0426   PHURINE 5.5 06/23/2012 0426   GLUCOSEU NEGATIVE 06/23/2012 0426   HGBUR LARGE (A) 06/23/2012 0426   BILIRUBINUR NEGATIVE 06/23/2012 0426   KETONESUR NEGATIVE 06/23/2012 0426   PROTEINUR 100 (A) 06/23/2012 0426   UROBILINOGEN 0.2 06/23/2012 0426   NITRITE NEGATIVE 06/23/2012 0426   LEUKOCYTESUR LARGE (A) 06/23/2012 0426    Radiological Exams on Admission: Dg Chest Port 1 View  Result Date: 12/21/2018 CLINICAL DATA:  Fall, femur fracture. EXAM: PORTABLE CHEST 1  VIEW COMPARISON:  06/24/2012 FINDINGS: Cardiomegaly. Low lung volumes. No confluent airspace opacities, effusions or edema. No acute bony abnormality. IMPRESSION: Low lung volumes.  Cardiomegaly.  No active disease. Electronically Signed   By: Charlett NoseKevin  Dover M.D.   On: 12/21/2018 23:24   Dg Femur Portable Min 2 Views Right  Result Date: 12/21/2018 CLINICAL DATA:  Right leg pain. EXAM: RIGHT FEMUR PORTABLE 2 VIEW COMPARISON:  None. FINDINGS: There is a comminuted fracture through the mid and distal right femur. Significant angulation and displacement of fracture fragments. Fracture against in the distal shaft and extends into the medial femoral condyle. IMPRESSION: Displaced, angulated comminuted distal right femoral fracture. Electronically Signed   By: Charlett NoseKevin  Dover M.D.   On: 12/21/2018 23:24    EKG: Independently reviewed.  Sinus tachycardia.  Assessment/Plan Principal Problem:   Closed fracture of distal end of femur (HCC) Active Problems:   Epilepsy (HCC)  Charles Clovia CuffL Mastrangelo Jr. is a 63 y.o. male with medical history significant for severe intellectual disability, chronically bedbound, and epilepsy who is admitted for closed fracture of the right femur.   Displaced closed fracture of distal end of femur: Unclear etiology, no report of fall, patient chronically bedbound. -Knee mobilization and Buck's traction ordered- -orthopedics consulted and to see in a.m. -N.p.o. except sips with meds  Epilepsy: Continue home Keppra, phenobarbital, phenytoin.  Seizure precautions.   DVT prophylaxis: SCDs Code Status: DNR, confirmed with father and bedside paperwork Family Communication: Discussed with father at bedside Disposition Plan: Pending right femoral repair and postop recovery. Consults called: Orthopedics consulted by EDP, will see in a.m. Admission status: Inpatient   Darreld McleanVishal Tasheka Houseman MD [facility] Pager [phone]  If 7PM-7AM, please contact  night-coverage [email or web address]  12/22/2018, 2:28 AM

## 2018-12-22 NOTE — Anesthesia Preprocedure Evaluation (Deleted)
Anesthesia Evaluation    Reviewed: Allergy & Precautions, NPO status , Patient's Chart, lab work & pertinent test results  History of Anesthesia Complications Negative for: history of anesthetic complications  Airway        Dental   Pulmonary neg pulmonary ROS,           Cardiovascular negative cardio ROS       Neuro/Psych Seizures -,  PSYCHIATRIC DISORDERS (severe intellectual disability- nonverbal and bedbound)    GI/Hepatic Neg liver ROS, Ulcerative colitis   Endo/Other  negative endocrine ROS  Renal/GU negative Renal ROS  negative genitourinary   Musculoskeletal negative musculoskeletal ROS (+)   Abdominal   Peds  Hematology negative hematology ROS (+)   Anesthesia Other Findings   Reproductive/Obstetrics                            Anesthesia Physical Anesthesia Plan  ASA: III  Anesthesia Plan: General   Post-op Pain Management:    Induction:   PONV Risk Score and Plan: 2 and Ondansetron, Dexamethasone, Midazolam and Treatment may vary due to age or medical condition  Airway Management Planned: Oral ETT  Additional Equipment: None  Intra-op Plan:   Post-operative Plan:   Informed Consent:   Plan Discussed with:   Anesthesia Plan Comments:        Anesthesia Quick Evaluation

## 2018-12-22 NOTE — Progress Notes (Signed)
Initial Nutrition Assessment  DOCUMENTATION CODES:   Not applicable  INTERVENTION:   -Magic Cup TID with meals, each supplement provides 290 kcals and 9 grams protein -MVI with minerals daily  NUTRITION DIAGNOSIS:   Increased nutrient needs related to post-op healing as evidenced by estimated needs.  GOAL:   Patient will meet greater than or equal to 90% of their needs  MONITOR:   PO intake, Supplement acceptance, Labs, Weight trends, Skin, I & O's  REASON FOR ASSESSMENT:   Low Braden    ASSESSMENT:   Charles Humphrey. is a 63 y.o. male with medical history significant for severe intellectual disability, chronically bedbound, and epilepsy who is admitted for closed fracture of the right femur.  Pt admitted with rt femur fracture.   Per MD notes, awaiting trauma service evaluation prior to OR.   Case discussed with RN, who reports pt is bedbound and nonverbal at baseline.   Pt resting quietly at time of visit and did not arouse to touch or voice. No family at bedside to provide additional history.   Reviewed records from [facility]. PTA diet is regular (diet was liberalized to promote PO intake). Pt also prescribed 90 ml MedPass BID. Last recorded wt was 177.2# on 12/10/18.   Labs reviewed.   NUTRITION - FOCUSED PHYSICAL EXAM:    Most Recent Value  Orbital Region  No depletion  Upper Arm Region  No depletion  Thoracic and Lumbar Region  No depletion  Buccal Region  No depletion  Temple Region  No depletion  Clavicle Bone Region  No depletion  Clavicle and Acromion Bone Region  No depletion  Scapular Bone Region  No depletion  Dorsal Hand  No depletion  Patellar Region  No depletion  Anterior Thigh Region  No depletion  Posterior Calf Region  No depletion  Edema (RD Assessment)  None  Hair  Reviewed  Eyes  Reviewed  Mouth  Reviewed  Skin  Reviewed  Nails  Reviewed       Diet Order:   Diet Order            Diet NPO time specified  Diet  effective midnight        Diet regular Room service appropriate? Yes; Fluid consistency: Thin  Diet effective now              EDUCATION NEEDS:   No education needs have been identified at this time  Skin:  Skin Assessment: Reviewed RN Assessment  Last BM:  PTA  Height:   Ht Readings from Last 1 Encounters:  12/22/18 5' 7.2" (1.707 m)    Weight:   Wt Readings from Last 1 Encounters:  12/22/18 77.9 kg    Ideal Body Weight:  67.7 kg  BMI:  Body mass index is 26.74 kg/m.  Estimated Nutritional Needs:   Kcal:  1700-1900  Protein:  90-105 grams  Fluid:  1.7-1.9 L    Anneke Cundy A. Mayford Knife, RD, LDN, CDE Pager: [phone] After hours Pager: [phone]

## 2018-12-22 NOTE — Progress Notes (Signed)
Orthopedic Tech Progress Note Patient Details:  Charles Humphrey 09-Oct-1956 [id number]  Musculoskeletal Traction Type of Traction: Bucks Skin Traction Traction Location: rle Traction Weight: 5 lbs   Post Interventions Patient Tolerated: Well Instructions Provided: Care of device, Adjustment of device   Trinna Post 12/22/2018, 4:39 AM

## 2018-12-22 NOTE — Social Work (Signed)
Pt is LTC resident at [facility]. Also under the care of [facility]. Will follow for support with disposition.  Octavio Graves, MSW, [facility] Clinical Social Work [phone]

## 2018-12-22 NOTE — Progress Notes (Signed)
Orthopedic Tech Progress Note Patient Details:  Charles Humphrey Feb 03, 1956 [id number]  Ortho Devices Type of Ortho Device: Knee Immobilizer Ortho Device/Splint Location: rle Ortho Device/Splint Interventions: Ordered, Application, Adjustment   Post Interventions Patient Tolerated: Well Instructions Provided: Care of device, Adjustment of device   Trinna Post 12/22/2018, 4:39 AM

## 2018-12-23 ENCOUNTER — Inpatient Hospital Stay (HOSPITAL_COMMUNITY)

## 2018-12-23 DIAGNOSIS — M899 Disorder of bone, unspecified: Secondary | ICD-10-CM

## 2018-12-23 DIAGNOSIS — Z7189 Other specified counseling: Secondary | ICD-10-CM

## 2018-12-23 DIAGNOSIS — Z515 Encounter for palliative care: Secondary | ICD-10-CM

## 2018-12-23 LAB — BASIC METABOLIC PANEL
Anion gap: 8 (ref 5–15)
BUN: 6 mg/dL (ref 6–20)
CO2: 21 mmol/L — ABNORMAL LOW (ref 22–32)
Calcium: 8 mg/dL — ABNORMAL LOW (ref 8.9–10.3)
Chloride: 109 mmol/L (ref 98–111)
Creatinine, Ser: 0.45 mg/dL — ABNORMAL LOW (ref 0.61–1.24)
GFR calc Af Amer: >60 mL/min (ref >60)
Glucose, Bld: 120 mg/dL — ABNORMAL HIGH (ref 70–99)
Potassium: 4 mmol/L (ref 3.5–5.1)
Sodium: 138 mmol/L (ref 135–145)

## 2018-12-23 LAB — CBC
HCT: 40.5 % (ref 39.0–52.0)
Hemoglobin: 12.9 g/dL — ABNORMAL LOW (ref 13.0–17.0)
MCH: 32.2 pg (ref 26.0–34.0)
MCHC: 31.9 g/dL (ref 30.0–36.0)
MCV: 101 fL — ABNORMAL HIGH (ref 80.0–100.0)
Platelets: 270 10*3/uL (ref 150–400)
RBC: 4.01 MIL/uL — ABNORMAL LOW (ref 4.22–5.81)
RDW: 14.7 % (ref 11.5–15.5)
WBC: 9.6 10*3/uL (ref 4.0–10.5)
nRBC: 0 % (ref 0.0–0.2)

## 2018-12-23 LAB — MAGNESIUM: Magnesium: 1.8 mg/dL (ref 1.7–2.4)

## 2018-12-23 MED ORDER — SODIUM CHLORIDE 0.9 % IV SOLN
175.0000 mg | Freq: Two times a day (BID) | INTRAVENOUS | Status: DC
  Administered 2018-12-23 – 2018-12-24 (×2): 175 mg via INTRAVENOUS
  Filled 2018-12-23 (×4): qty 3.5

## 2018-12-23 MED ORDER — IOHEXOL 300 MG/ML  SOLN
100.0000 mL | Freq: Once | INTRAMUSCULAR | Status: AC | PRN
  Administered 2018-12-23: 100 mL via INTRAVENOUS

## 2018-12-23 MED ORDER — LEVETIRACETAM IN NACL 500 MG/100ML IV SOLN
500.0000 mg | Freq: Two times a day (BID) | INTRAVENOUS | Status: DC
  Administered 2018-12-23 – 2018-12-24 (×3): 500 mg via INTRAVENOUS
  Filled 2018-12-23 (×4): qty 100

## 2018-12-23 MED ORDER — ACETAMINOPHEN 500 MG PO TABS
1000.0000 mg | ORAL_TABLET | Freq: Three times a day (TID) | ORAL | Status: DC
  Administered 2018-12-23: 1000 mg via ORAL
  Filled 2018-12-23 (×2): qty 2

## 2018-12-23 MED ORDER — SODIUM CHLORIDE 0.9 % IV SOLN
160.0000 mg | Freq: Every day | INTRAVENOUS | Status: DC
  Administered 2018-12-23 – 2018-12-24 (×2): 160 mg via INTRAVENOUS
  Filled 2018-12-23 (×2): qty 1.23

## 2018-12-23 MED ORDER — ENOXAPARIN SODIUM 40 MG/0.4ML ~~LOC~~ SOLN
40.0000 mg | SUBCUTANEOUS | Status: DC
  Administered 2018-12-23 – 2018-12-24 (×2): 40 mg via SUBCUTANEOUS
  Filled 2018-12-23 (×2): qty 0.4

## 2018-12-23 NOTE — Consult Note (Signed)
Consultation Note Date: 12/23/2018   Patient Name: Charles Humphrey.  DOB: 1956/09/13  MRN: [id number]  Age / Sex: 63 y.o., male  PCP: Leonard Downing, MD Referring Physician: Elodia Florence., *  Reason for Consultation: Establishing goals of care  HPI/Patient Profile: 63 y.o. male  with past medical history of severe intellectual disability, epilepsy, nonambulatory, lymphedema admitted on 12/21/2018 with right femur fracture without trauma and CT found to have "large 13.4 cm aggressive osseous lesion with soft tissue component."   Clinical Assessment and Goals of Care: I met today at "Charles Humphrey's" bedside with his father and hospice liaison. We discussed the concern with bone lesion that appears to be cancer primary bone vs myeloma as CT scans do not show any evidence of metastatic disease. Father is very tearful. He has lost 2 wives (the second in the past month) and is now facing the loss of his son.   We discussed moving forward and father seems inclined to focus on comfort. He does not want to subject Charles Humphrey to aggressive work up and treatments that Charles Humphrey would not understand.   I spoke with Charles Humphrey's sister, Charles Humphrey, who is legally Charles Humphrey's guardian per father. I explained findings to [place]. I also discussed options of transfer to tertiary center to pursue oncology work up, palliative surgical repair of fracture at [facility], or full comfort care with no surgical repair. Unless they desire oncology work up he should continue with hospice care. She seems to desire focus on comfort as well.   Encouraged family to discuss options and goals for what is best for [place]. They appear to be leaning towards comfort care.   Primary Decision Maker LEGAL GUARDIAN sister Charles Humphrey BUT father drives decision making    SUMMARY OF RECOMMENDATIONS   - DNR - Likely comfort care with return to SNF with  hospice  Code Status/Advance Care Planning:  DNR   Symptom Management:   Pain s/p femur fracture: Tylenol 1000 mg po TID. OxyIR 5 mg every 6 hours prn. Needs close assessment of nonverbal pain cues.   Palliative Prophylaxis:   Aspiration, Bowel Regimen, Frequent Pain Assessment, Oral Care and Turn Reposition   Psycho-social/Spiritual:   Desire for further Chaplaincy support:yes  Additional Recommendations: Grief/Bereavement Support  Prognosis:   < 6 months  Discharge Planning: To Be Determined      Primary Diagnoses: Present on Admission: . Closed fracture of distal end of femur ([place])   I have reviewed the medical record, interviewed the patient and family, and examined the patient. The following aspects are pertinent.  Past Medical History:  Diagnosis Date  . Fungemia 06/25/2012  . Healthcare-associated pneumonia 01/12/2012  . Hydronephrosis 01/12/2012  . Hyperpotassemia   . Intellectual disability   . Iron deficiency anemia 06/24/2012  . Metabolic encephalopathy   . Muscle weakness (generalized)   . Nephrolithiasis 01/12/2012  . Obesity, unspecified   . Other convulsions   . Other lymphedema   . Other physical therapy   . Ulcerative colitis, unspecified   .  Unspecified constipation   . Unspecified intellectual disabilities    Social History   Socioeconomic History  . Marital status: Married    Spouse name: Not on file  . Number of children: Not on file  . Years of education: Not on file  . Highest education level: Not on file  Occupational History  . Not on file  Social Needs  . Financial resource strain: Not on file  . Food insecurity:    Worry: Not on file    Inability: Not on file  . Transportation needs:    Medical: Not on file    Non-medical: Not on file  Tobacco Use  . Smoking status: Never Smoker  . Smokeless tobacco: Never Used  Substance and Sexual Activity  . Alcohol use: No  . Drug use: No  . Sexual activity: Never  Lifestyle  .  Physical activity:    Days per week: Not on file    Minutes per session: Not on file  . Stress: Not on file  Relationships  . Social connections:    Talks on phone: Not on file    Gets together: Not on file    Attends religious service: Not on file    Active member of club or organization: Not on file    Attends meetings of clubs or organizations: Not on file    Relationship status: Not on file  Other Topics Concern  . Not on file  Social History Narrative  . Not on file   No family history on file. Scheduled Meds: . acetaminophen  650 mg Oral Q6H  . docusate sodium  250 mg Oral BID  . enoxaparin (LOVENOX) injection  40 mg Subcutaneous Q24H  . multivitamin with minerals  1 tablet Oral Daily  . mupirocin ointment  1 application Topical BID  . polyethylene glycol  17 g Oral Daily  . povidone-iodine  2 application Topical Once  . senna  2 tablet Oral QHS  . tamsulosin  0.8 mg Oral QHS   Continuous Infusions: . levETIRAcetam 500 mg (12/23/18 1129)  . PHENObarbital 1000 mg IVPB 160 mg (12/23/18 1015)  . phenytoin (DILANTIN) IV 175 mg (12/23/18 1213)   PRN Meds:.albuterol, ondansetron **OR** ondansetron (ZOFRAN) IV, oxyCODONE Allergies  Allergen Reactions  . Other Other (See Comments)    No dairy products   Review of Systems  Unable to perform ROS: Other  Constitutional:       Nonverbal, chronic severe intellectual disability    Physical Exam Vitals signs and nursing note reviewed.  Constitutional:      Appearance: He is overweight. He is ill-appearing.  Cardiovascular:     Rate and Rhythm: Normal rate.  Pulmonary:     Effort: Pulmonary effort is normal. No tachypnea, accessory muscle usage or respiratory distress.  Neurological:     Mental Status: He is alert. Mental status is at baseline.     Comments: Does not follow commands, nonverbal      Vital Signs: BP (!) 146/89 (BP Location: Right Arm)   Pulse 98   Temp 98.4 F (36.9 C) (Oral)   Resp 19   Ht 5'  7.2" (1.707 m)   Wt 77.9 kg   SpO2 100%   BMI 26.74 kg/m  Pain Scale: Faces       SpO2: SpO2: 100 % O2 Device:SpO2: 100 % O2 Flow Rate: .   IO: Intake/output summary:   Intake/Output Summary (Last 24 hours) at 12/23/2018 1255 Last data filed at 12/23/2018 0530 Gross  per 24 hour  Intake 414.79 ml  Output 1000 ml  Net -585.21 ml    LBM:   Baseline Weight: Weight: 77.9 kg Most recent weight: Weight: 77.9 kg     Palliative Assessment/Data: 30%   Flowsheet Rows     Most Recent Value  Intake Tab  Referral Department  Hospitalist  Unit at Time of Referral  Med/Surg Unit  Date Notified  12/23/18  Palliative Care Type  New Palliative care  Reason for referral  Clarify Goals of Care  Date of Admission  12/21/18  # of days IP prior to Palliative referral  2  Clinical Assessment  Psychosocial & Spiritual Assessment  Palliative Care Outcomes      Time In: 1030 Time Out: 1150 Time Total: 80 min Greater than 50%  of this time was spent counseling and coordinating care related to the above assessment and plan.  Signed by: Vinie Sill, NP Palliative Medicine Team Pager # [phone] (M-F 8a-5p) Team Phone # [phone] (Nights/Weekends)

## 2018-12-23 NOTE — Progress Notes (Signed)
Gave patient Hibiclens bath, tolerated well.

## 2018-12-23 NOTE — Progress Notes (Addendum)
Patient ID: Charles Quest., male   DOB: March 09, 1956, 63 y.o.   MRN: [id number]   LOS: 1 day   Subjective: NSC   Objective: Vital signs in last 24 hours: Temp:  [98.4 F (36.9 C)] 98.4 F (36.9 C) (01/29 2204) Pulse Rate:  [98-105] 98 (01/30 0543) Resp:  [19-20] 19 (01/30 0543) BP: (124-146)/(78-89) 146/89 (01/30 0543) SpO2:  [96 %-100 %] 100 % (01/30 0543)     Laboratory  CBC Recent Labs    12/22/18 0746 12/23/18 0606  WBC 9.7 9.6  HGB 12.8* 12.9*  HCT 41.2 40.5  PLT 311 270   BMET Recent Labs    12/22/18 0746 12/23/18 0606  NA 140 138  K 3.7 4.0  CL 107 109  CO2 24 21*  GLUCOSE 121* 120*  BUN 6 6  CREATININE 0.49* 0.45*  CALCIUM 8.3* 8.0*     Physical Exam General appearance: no distress   Assessment/Plan: Right pathologic femur fx -- Without a separate primary source of the lesion he would need to be under the care of an orthopedic oncologist and we unfortunately do not have that in our community. I have recommended to his attending to transfer patient accordingly. Attending has consulted palliative care as it's not clear how aggressive family would like to be. If family does not wish to pursue transfer and treatment of lesion then fixation for palliation may be an option. Will await family's decision.    Freeman Caldron, PA-C Orthopedic Surgery [phone] 12/23/2018

## 2018-12-23 NOTE — [place] FL2 (Signed)
[place] MEDICAID FL2 LEVEL OF CARE SCREENING TOOL     IDENTIFICATION  Patient Name: Charles Humphrey. Birthdate: [birth year] Sex: male Admission Date (Current Location): 12/21/2018  [facility] and [place] Number:  Producer, television/film/video and Address:  [facility] [place], [address] [place], [place] [address]      Provider Number: [id number]  Attending Physician Name and Address:  Zigmund Daniel., *  Relative Name and Phone Number:  Hamilton Heberlein Sr, father, [phone]    Current Level of Care: SNF Recommended Level of Care: Skilled Nursing Facility Prior Approval Number:    Date Approved/Denied:   PASRR Number:    Discharge Plan: SNF    Current Diagnoses: Patient Active Problem List   Diagnosis Date Noted  . Closed fracture of distal end of femur (HCC) 12/22/2018  . Fungemia 06/25/2012  . Iron deficiency anemia 06/24/2012  . UTI (lower urinary tract infection) 06/23/2012  . Hypernatremia 01/13/2012  . Healthcare-associated pneumonia 01/12/2012  . Epilepsy (HCC) 01/12/2012  . Mental retardation 01/12/2012  . ARF (acute renal failure) (HCC) 01/12/2012  . Nephrolithiasis 01/12/2012  . Hydronephrosis 01/12/2012  . C. difficile colitis 01/12/2012  . Hyperglycemia 01/12/2012    Orientation RESPIRATION BLADDER Height & Weight     (follows commands)  Normal Incontinent, External catheter Weight: 171 lb 11.8 oz (77.9 kg) Height:  5' 7.2" (170.7 cm)  BEHAVIORAL SYMPTOMS/MOOD NEUROLOGICAL BOWEL NUTRITION STATUS      Continent Diet(see discharge summary)  AMBULATORY STATUS COMMUNICATION OF NEEDS Skin   Total Care Non-Verbally                         Personal Care Assistance Level of Assistance  Bathing, Feeding, Dressing, Total care Bathing Assistance: Maximum assistance Feeding assistance: Maximum assistance Dressing Assistance: Maximum assistance Total Care Assistance: Maximum assistance   Functional Limitations Info   Hearing, Sight, Speech Sight Info: Adequate Hearing Info: Adequate Speech Info: Impaired(pt is nonverbal)    SPECIAL CARE FACTORS FREQUENCY                       Contractures Contractures Info: Not present    Additional Factors Info  Code Status, Allergies, Psychotropic Code Status Info: DNR Allergies Info: No Dairy Products Psychotropic Info: levETIRAcetam (KEPPRA) 100 MG/ML solution 500 mg 2x daily PO         Current Medications (12/23/2018):  This is the current hospital active medication list Current Facility-Administered Medications  Medication Dose Route Frequency Provider Last Rate Last Dose  . acetaminophen (TYLENOL) tablet 1,000 mg  1,000 mg Oral TID Yong Channel C, NP      . albuterol (PROVENTIL) (2.5 MG/3ML) 0.083% nebulizer solution 2.5 mg  2.5 mg Nebulization Q6H PRN Darreld Mclean R, MD      . docusate sodium (COLACE) capsule 250 mg  250 mg Oral BID Darreld Mclean R, MD   250 mg at 12/23/18 1019  . enoxaparin (LOVENOX) injection 40 mg  40 mg Subcutaneous Q24H Zigmund Daniel., MD   40 mg at 12/23/18 1011  . levETIRAcetam (KEPPRA) IVPB 500 mg/100 mL premix  500 mg Intravenous Q12H Zigmund Daniel., MD 400 mL/hr at 12/23/18 1129 500 mg at 12/23/18 1129  . multivitamin with minerals tablet 1 tablet  1 tablet Oral Daily Zigmund Daniel., MD   1 tablet at 12/23/18 1011  . mupirocin ointment (BACTROBAN) 2 % 1 application  1 application Topical BID Bjorn PippinVarkey, Dax T, MD   1 application at 12/23/18 1012  . ondansetron (ZOFRAN) tablet 4 mg  4 mg Oral Q6H PRN Charlsie QuestPatel, Vishal R, MD       Or  . ondansetron (ZOFRAN) injection 4 mg  4 mg Intravenous Q6H PRN Darreld McleanPatel, Vishal R, MD      . oxyCODONE (Oxy IR/ROXICODONE) immediate release tablet 5 mg  5 mg Oral Q6H PRN Zigmund DanielPowell, A Caldwell Jr., MD      . PHENObarbital (LUMINAL) 160 mg in sodium chloride 0.9 % 100 mL IVPB  160 mg Intravenous Daily Zigmund DanielPowell, A Caldwell Jr., MD 200 mL/hr at 12/23/18 1015 160 mg at 12/23/18 1015   . phenytoin (DILANTIN) 175 mg in sodium chloride 0.9 % 100 mL IVPB  175 mg Intravenous Q12H Zigmund DanielPowell, A Caldwell Jr., MD 207 mL/hr at 12/23/18 1213 175 mg at 12/23/18 1213  . polyethylene glycol (MIRALAX / GLYCOLAX) packet 17 g  17 g Oral Daily Zigmund DanielPowell, A Caldwell Jr., MD   17 g at 12/23/18 1012  . povidone-iodine 10 % swab 2 application  2 application Topical Once Freeman CaldronJeffery, Michael J, PA-C      . senna (SENOKOT) tablet 17.2 mg  2 tablet Oral QHS Charlsie QuestPatel, Vishal R, MD   17.2 mg at 12/22/18 2157  . tamsulosin (FLOMAX) capsule 0.8 mg  0.8 mg Oral QHS Darreld McleanPatel, Vishal R, MD   0.8 mg at 12/22/18 2157     Discharge Medications: Please see discharge summary for a list of discharge medications.  Relevant Imaging Results:  Relevant Lab Results:   Additional Information [phone]; with continued [facility]  Marliss Buttacavoli H Lesly Joslyn, LCSWA

## 2018-12-23 NOTE — Progress Notes (Signed)
PROGRESS NOTE    Vanetta Mulders.  MYT:[id number] DOB: Jul 16, 1956 DOA: 12/21/2018 PCP: Leonard Downing, MD  Brief Narrative:  Vanetta Mulders. is Wanell Lorenzi 63 y.o. male with medical history significant for severe intellectual disability and epilepsy who presents to the ED from [facility] facility for evaluation of suspected right lower extremity fracture.  Patient is nonverbal and unable to provide any history, therefore entirety of history is obtained from patient's father at bedside and chart review.  Patient is chronically bedbound and is Mayukha Symmonds resident at [facility] facility.  Per ED documentation, when staff were rolling him in bed they noticed Allante Whitmire deformity to his right leg and due to concern for fracture he was sent to the ED.  There is no report of fall.  Per father, he is occasionally set up in Emilygrace Grothe chair and transfers with use of Lexie Koehl Hoyer lift.  Assessment & Plan:   Principal Problem:   Closed fracture of distal end of femur ([place]) Active Problems:   Epilepsy ([place])   Bertin Kandis Nab. is Lavell Ridings 63 y.o. male with medical history significant for severe intellectual disability, chronically bedbound, and epilepsy who is admitted for closed fracture of the right femur.  13.4 cm Aggressive Osseous Lesion with Soft Tissue Component  Pathologic Fracture  Displaced closed fracture of distal end of femur: - Myeloma panel, free light chains, urine studies pending - CT chest/abdomen/pelvis without evident malignancy - Discussed with orthopedics this AM, recommending transfer to [facility] or [facility] for orthopedic oncology - Discussed briefly with father over phone, pt is on hospice.  I'm not sure how much aggressive work up/treatment will meaningfully change his quality of life at this time.  But will consult palliative care to help clarify goals of care in this setting.   -Knee mobilization and Buck's traction ordered -orthopedics consult, appreciate recs -Scheduled apap and  oxy prn for pain.  Bowel regimen.  Epilepsy: Continue home Keppra, phenobarbital, phenytoin.  Seizure precautions. Will transition these meds to IV as some difficulty with him taking them here.  Of note, pt followed by Hospice in outpatient setting  DVT prophylaxis: SCD, lovenox Code Status: DNR Family Communication: father over phone Disposition Plan: pending decisions regarding goals of care  Consultants:   orthopedics  Procedures:   none  Antimicrobials:  Anti-infectives (From admission, onward)   None        Subjective: Nonverbal Appears comfortable Discussed with father over phone  Objective: Vitals:   12/22/18 0317 12/22/18 0601 12/22/18 2204 12/23/18 0543  BP: 139/90 129/85 124/78 (!) 146/89  Pulse: (!) 103 (!) 107 (!) 105 98  Resp: (!) '22 18 20 19  ' Temp: 97.7 F (36.5 C) (!) 97.5 F (36.4 C) 98.4 F (36.9 C)   TempSrc: Oral Oral Oral   SpO2: 100% 97% 96% 100%  Weight: 77.9 kg     Height: 5' 7.2" (1.707 m)       Intake/Output Summary (Last 24 hours) at 12/23/2018 0910 Last data filed at 12/23/2018 0530 Gross per 24 hour  Intake 414.79 ml  Output 1000 ml  Net -585.21 ml   Filed Weights   12/22/18 0317  Weight: 77.9 kg    Examination:  General: No acute distress. Cardiovascular: Heart sounds show Glennis Borger regular rate, and rhythm Lungs: Clear to auscultation bilaterally  Abdomen: Soft, nontender, nondistended Neurological: Nonverbal. Awakens briefly.  Skin: Warm and dry. No rashes or lesions. Extremities: RLE in traction   Data Reviewed: I have personally  reviewed following labs and imaging studies  CBC: Recent Labs  Lab 12/22/18 0021 12/22/18 0746 12/23/18 0606  WBC 10.1 9.7 9.6  NEUTROABS 7.8*  --   --   HGB 12.6* 12.8* 12.9*  HCT 38.8* 41.2 40.5  MCV 101.0* 100.2* 101.0*  PLT 319 311 144   Basic Metabolic Panel: Recent Labs  Lab 12/22/18 0021 12/22/18 0746 12/23/18 0606  NA 139 140 138  K 3.5 3.7 4.0  CL 105 107 109  CO2  24 24 21*  GLUCOSE 160* 121* 120*  BUN '8 6 6  ' CREATININE 0.52* 0.49* 0.45*  CALCIUM 8.3* 8.3* 8.0*  MG  --   --  1.8   GFR: Estimated Creatinine Clearance: 92.5 mL/min (Erisha Paugh) (by C-G formula based on SCr of 0.45 mg/dL (L)). Liver Function Tests: No results for input(s): AST, ALT, ALKPHOS, BILITOT, PROT, ALBUMIN in the last 168 hours. No results for input(s): LIPASE, AMYLASE in the last 168 hours. No results for input(s): AMMONIA in the last 168 hours. Coagulation Profile: No results for input(s): INR, PROTIME in the last 168 hours. Cardiac Enzymes: No results for input(s): CKTOTAL, CKMB, CKMBINDEX, TROPONINI in the last 168 hours. BNP (last 3 results) No results for input(s): PROBNP in the last 8760 hours. HbA1C: No results for input(s): HGBA1C in the last 72 hours. CBG: No results for input(s): GLUCAP in the last 168 hours. Lipid Profile: No results for input(s): CHOL, HDL, LDLCALC, TRIG, CHOLHDL, LDLDIRECT in the last 72 hours. Thyroid Function Tests: No results for input(s): TSH, T4TOTAL, FREET4, T3FREE, THYROIDAB in the last 72 hours. Anemia Panel: No results for input(s): VITAMINB12, FOLATE, FERRITIN, TIBC, IRON, RETICCTPCT in the last 72 hours. Sepsis Labs: No results for input(s): PROCALCITON, LATICACIDVEN in the last 168 hours.  Recent Results (from the past 240 hour(s))  Surgical pcr screen     Status: Abnormal   Collection Time: 12/22/18  3:32 AM  Result Value Ref Range Status   MRSA, PCR NEGATIVE NEGATIVE Final   Staphylococcus aureus POSITIVE (Joncarlos Atkison) NEGATIVE Final    Comment: (NOTE) The Xpert SA Assay (FDA approved for NASAL specimens in patients 64 years of age and older), is one component of Gwynn Crossley comprehensive surveillance program. It is not intended to diagnose infection nor to guide or monitor treatment. Performed at [facility], [place] [address], [place], [place] [address]          Radiology Studies: Ct Chest W Contrast  Result Date:  12/23/2018 CLINICAL DATA:  Pathologic fracture. Constipation and ulcerative colitis. EXAM: CT CHEST, ABDOMEN AND PELVIS WITHOUT CONTRAST TECHNIQUE: Multidetector CT imaging of the chest, abdomen and pelvis was performed following the standard protocol without IV contrast. COMPARISON:  Abdominal CT 07/09/2018 FINDINGS: CT CHEST FINDINGS Cardiovascular: Cardiomegaly. No pericardial effusion. No significant vascular finding Mediastinum/Nodes: Negative for adenopathy or inflammation. Lungs/Pleura: Low volume chest. Mild atelectatic opacity. Chronic cluster of nodules in the lateral right costophrenic sulcus, likely scarring given stability. No nodularity. Musculoskeletal: Profound osteopenia. No focal lesion or acute fracture. Remote and healed/healing lateral left rib fractures. CT ABDOMEN PELVIS FINDINGS Hepatobiliary: No focal liver abnormality.No evidence of biliary obstruction or stone. Pancreas: Unremarkable. Spleen: Unremarkable. Adrenals/Urinary Tract: Negative adrenals. Chronic bilateral hydronephrosis with urothelial thickening and periureteric edema at both UPJ. Multiple bilateral renal calculi which do not appear to cause obstruction on either side, although 2 stones on the right have migrated into the renal pelvis since prior. The thick walled bladder is decompressed by Foley catheter. Stomach/Bowel: The redundant  colon is diffusely distended by gas or stool. No small bowel dilatation. No inflammation or gross mass. Vascular/Lymphatic: Minimal atherosclerotic calcification. No mass or adenopathy. Reproductive:Negative. Other: No ascites or pneumoperitoneum. Musculoskeletal: No acute abnormalities. Marked osteopenia. 1 cm lucency in the left iliac medullary space that is stable and likely benign. IMPRESSION: 1. No evident malignancy to explain right femur lesion. No acute finding. 2. Severe generalized osteopenia. 3. Redundant colon distended by gas and stool without obstruction. 4. Chronic bilateral  hydronephrosis above the chronically inflamed or scarred UPJ. Multiple bilateral renal calculi. Electronically Signed   By: Monte Fantasia M.D.   On: 12/23/2018 06:00   Ct Abdomen Pelvis W Contrast  Result Date: 12/23/2018 CLINICAL DATA:  Pathologic fracture. Constipation and ulcerative colitis. EXAM: CT CHEST, ABDOMEN AND PELVIS WITHOUT CONTRAST TECHNIQUE: Multidetector CT imaging of the chest, abdomen and pelvis was performed following the standard protocol without IV contrast. COMPARISON:  Abdominal CT 07/09/2018 FINDINGS: CT CHEST FINDINGS Cardiovascular: Cardiomegaly. No pericardial effusion. No significant vascular finding Mediastinum/Nodes: Negative for adenopathy or inflammation. Lungs/Pleura: Low volume chest. Mild atelectatic opacity. Chronic cluster of nodules in the lateral right costophrenic sulcus, likely scarring given stability. No nodularity. Musculoskeletal: Profound osteopenia. No focal lesion or acute fracture. Remote and healed/healing lateral left rib fractures. CT ABDOMEN PELVIS FINDINGS Hepatobiliary: No focal liver abnormality.No evidence of biliary obstruction or stone. Pancreas: Unremarkable. Spleen: Unremarkable. Adrenals/Urinary Tract: Negative adrenals. Chronic bilateral hydronephrosis with urothelial thickening and periureteric edema at both UPJ. Multiple bilateral renal calculi which do not appear to cause obstruction on either side, although 2 stones on the right have migrated into the renal pelvis since prior. The thick walled bladder is decompressed by Foley catheter. Stomach/Bowel: The redundant colon is diffusely distended by gas or stool. No small bowel dilatation. No inflammation or gross mass. Vascular/Lymphatic: Minimal atherosclerotic calcification. No mass or adenopathy. Reproductive:Negative. Other: No ascites or pneumoperitoneum. Musculoskeletal: No acute abnormalities. Marked osteopenia. 1 cm lucency in the left iliac medullary space that is stable and likely  benign. IMPRESSION: 1. No evident malignancy to explain right femur lesion. No acute finding. 2. Severe generalized osteopenia. 3. Redundant colon distended by gas and stool without obstruction. 4. Chronic bilateral hydronephrosis above the chronically inflamed or scarred UPJ. Multiple bilateral renal calculi. Electronically Signed   By: Monte Fantasia M.D.   On: 12/23/2018 06:00   Ct Femur Right Wo Contrast  Result Date: 12/22/2018 CLINICAL DATA:  Distal femur fracture. No reported trauma. Patient is Deanndra Kirley. EXAM: CT OF THE LOWER RIGHT EXTREMITY WITHOUT CONTRAST TECHNIQUE: Multidetector CT imaging of the right lower extremity was performed according to the standard protocol. COMPARISON:  Right femur x-rays from yesterday. FINDINGS: Bones/Joint/Cartilage Again seen is Shamell Suarez comminuted fracture of the mid to distal right femoral metadiaphysis, extending into the proximal medial femoral condyle. No mention into the knee joint. There is Adarsh Mundorf approximately 1.7 cm of impaction with apex lateral and posterior angulation. The entire mid to distal femoral metadiaphysis intramedullary space is filled with soft tissue density, which extends beyond the cortical margins. The mass, including its soft tissue component, measures approximately 5.5 x 5.4 x 13.4 cm, and extends to the medial femoral condyle. There is no involvement of the superficial femoral vessels. There is no knee joint effusion. Severe osteopenia. Ligaments Suboptimally assessed by CT. Muscles and Tendons Moderate diffuse muscle atrophy. Soft tissues Unremarkable. IMPRESSION: 1. Pathologic fracture of the right distal femur through Torrez Renfroe large 13.4 cm aggressive osseous lesion with soft tissue component. The differential diagnosis  includes Kensy Blizard primary bone tumor such as high-grade chondrosarcoma, multiple myeloma, or metastatic disease. These results were called by telephone at the time of interpretation on 12/22/2018 at 2:43 pm to Dr. Ophelia Charter, who verbally acknowledged  these results. Electronically Signed   By: Titus Dubin M.D.   On: 12/22/2018 15:02   Dg Chest Port 1 View  Result Date: 12/21/2018 CLINICAL DATA:  Fall, femur fracture. EXAM: PORTABLE CHEST 1 VIEW COMPARISON:  06/24/2012 FINDINGS: Cardiomegaly. Low lung volumes. No confluent airspace opacities, effusions or edema. No acute bony abnormality. IMPRESSION: Low lung volumes.  Cardiomegaly.  No active disease. Electronically Signed   By: Rolm Baptise M.D.   On: 12/21/2018 23:24   Dg Femur Portable Min 2 Views Right  Result Date: 12/21/2018 CLINICAL DATA:  Right leg pain. EXAM: RIGHT FEMUR PORTABLE 2 VIEW COMPARISON:  None. FINDINGS: There is Devinne Epstein comminuted fracture through the mid and distal right femur. Significant angulation and displacement of fracture fragments. Fracture against in the distal shaft and extends into the medial femoral condyle. IMPRESSION: Displaced, angulated comminuted distal right femoral fracture. Electronically Signed   By: Rolm Baptise M.D.   On: 12/21/2018 23:24        Scheduled Meds: . acetaminophen  650 mg Oral Q6H  . docusate sodium  250 mg Oral BID  . enoxaparin (LOVENOX) injection  40 mg Subcutaneous Q24H  . multivitamin with minerals  1 tablet Oral Daily  . mupirocin ointment  1 application Topical BID  . PHENObarbital  160 mg Oral QPM  . polyethylene glycol  17 g Oral Daily  . povidone-iodine  2 application Topical Once  . senna  2 tablet Oral QHS  . tamsulosin  0.8 mg Oral QHS   Continuous Infusions: . lactated ringers 100 mL/hr at 12/23/18 0834  . levETIRAcetam    . phenytoin (DILANTIN) IV       LOS: 1 day    Time spent: over 30 min    Fayrene Helper, MD [facility] Pager [email or web address]  If 7PM-7AM, please contact night-coverage [email or web address] Password [email or web address] 12/23/2018, 9:10 AM

## 2018-12-23 NOTE — Plan of Care (Signed)
  Problem: Clinical Measurements: Goal: Will remain free from infection Outcome: Progressing   Problem: Nutrition: Goal: Adequate nutrition will be maintained Outcome: Progressing   Problem: Elimination: Goal: Will not experience complications related to urinary retention Outcome: Progressing   Problem: Pain Managment: Goal: General experience of comfort will improve Outcome: Progressing   Problem: Safety: Goal: Ability to remain free from injury will improve Outcome: Progressing   Problem: Skin Integrity: Goal: Risk for impaired skin integrity will decrease Outcome: Progressing

## 2018-12-23 NOTE — Progress Notes (Signed)
[id number]  GIP RN Visit @ 1045  This is a covered and related GIP visit with a hospice diagnosis of protein calorie malnutrition. Pt is from [facility], staff was repositioning him in the bed and noticed his leg appeared to be fractured.  No reported fall or other signs of trauma. Staff activated EMS and he was transferred to [facility] for evaluation.  He is admitted with closed femur fracture.  Visited patient at bedside with father present. Aleshia with PMT also present. Pt is alert and being fed by father. He has foley catheter and is in bucks traction. Pain assessment on Faces score is 0. The patient's father states the patient does not express pain. On a previous occasion they only figured out he was in pain with kidney stones when he refused to eat  Patient's father, Charles Humphrey and I discussed possible treatment options verses comfort care only. At this time the father wants the patient to be comfortable, return to facility with hospice support and not pursue aggressive treatments. He did say he is having a difficult time making this decision but understands aggressive treatment is going to be difficult and uncomfortable for the patient. With patient's severe intellectual disability, he will not understand the discomfort that would come with agressive treatment.  VS: 145/89, 98, 19, SpO2 100% RA, afebrile  Abnormal labs: Co2 21, Glucose 120, Creatinine 0.45, Calcium 8.0, RBC 4.01, Hgb 12.9, MCV 101.0  Imaging: CT right femur -IMPRESSION: 1. Pathologic fracture of the right distal femur through a large 13.4 cm aggressive osseous lesion with soft tissue component. The differential diagnosis includes a primary bone tumor such as high-grade chondrosarcoma, multiple myeloma, or metastatic disease.  Medications: Keppra IVPB 531m BID, phenobarbital 1622mIVPB QD, Dilantin 17561mVPG BID  Per MD notes: 13.4 cm Aggressive Osseous Lesion with Soft Tissue  Component   Pathologic Fracture   Displaced closed fracture of distal end of femur: - Myeloma panel, free light chains, urine studies pending - CT chest/abdomen/pelvis without evident malignancy - Discussed with orthopedics this AM, [facility] - Discussed briefly with father over phone, pt is on hospice.  I'm not sure how much aggressive work up/treatment will meaningfully change his quality of life at this time.  But will consult palliative care to help clarify goals of care in this setting.   -Knee mobilization and Buck's traction ordered -orthopedics consult, appreciate recs -Scheduled apap and oxy prn for pain.  Bowel regimen.  Epilepsy: Continue home Keppra, phenobarbital, phenytoin. Seizure precautions. Will transition these meds to IV as some difficulty with him taking them here.  Discharge planning:  ongoing  PCG:  Spoke with father at bedside as noted above.   IDT: Updated  Transfer summary and medication list placed on shadow chart  Once ready for discharge, please use [facility] for ambulance transport as they contract this service for [facility].  Thank you, MarFarrel GordonN, [facility] (listed in [phone]

## 2018-12-24 DIAGNOSIS — Z515 Encounter for palliative care: Secondary | ICD-10-CM

## 2018-12-24 DIAGNOSIS — M899 Disorder of bone, unspecified: Secondary | ICD-10-CM

## 2018-12-24 DIAGNOSIS — Z7189 Other specified counseling: Secondary | ICD-10-CM

## 2018-12-24 LAB — KAPPA/LAMBDA LIGHT CHAINS
Kappa free light chain: 33.8 mg/L — ABNORMAL HIGH (ref 3.3–19.4)
Kappa, lambda light chain ratio: 1.19 (ref 0.26–1.65)
Lambda free light chains: 28.3 mg/L — ABNORMAL HIGH (ref 5.7–26.3)

## 2018-12-24 LAB — CBC
HCT: 40.1 % (ref 39.0–52.0)
Hemoglobin: 13.2 g/dL (ref 13.0–17.0)
MCH: 32.6 pg (ref 26.0–34.0)
MCHC: 32.9 g/dL (ref 30.0–36.0)
MCV: 99 fL (ref 80.0–100.0)
Platelets: 390 10*3/uL (ref 150–400)
RBC: 4.05 MIL/uL — ABNORMAL LOW (ref 4.22–5.81)
RDW: 14.4 % (ref 11.5–15.5)
WBC: 9.3 10*3/uL (ref 4.0–10.5)
nRBC: 0 % (ref 0.0–0.2)

## 2018-12-24 LAB — BASIC METABOLIC PANEL
Anion gap: 9 (ref 5–15)
BUN: 9 mg/dL (ref 6–20)
CO2: 20 mmol/L — ABNORMAL LOW (ref 22–32)
Calcium: 8.1 mg/dL — ABNORMAL LOW (ref 8.9–10.3)
Chloride: 109 mmol/L (ref 98–111)
Creatinine, Ser: 0.46 mg/dL — ABNORMAL LOW (ref 0.61–1.24)
GFR calc Af Amer: >60 mL/min (ref >60)
GFR calc non Af Amer: >60 mL/min (ref >60)
Glucose, Bld: 108 mg/dL — ABNORMAL HIGH (ref 70–99)
Potassium: 4.5 mmol/L (ref 3.5–5.1)
SODIUM: 138 mmol/L (ref 135–145)

## 2018-12-24 LAB — MAGNESIUM: MAGNESIUM: 1.8 mg/dL (ref 1.7–2.4)

## 2018-12-24 MED ORDER — DOCUSATE SODIUM 50 MG/5ML PO LIQD
250.0000 mg | Freq: Two times a day (BID) | ORAL | Status: DC
  Administered 2018-12-24: 250 mg via ORAL
  Filled 2018-12-24: qty 30

## 2018-12-24 MED ORDER — ACETAMINOPHEN 160 MG/5ML PO SOLN
1000.0000 mg | Freq: Three times a day (TID) | ORAL | Status: DC
  Administered 2018-12-24: 1000 mg via ORAL
  Filled 2018-12-24 (×2): qty 40.6

## 2018-12-24 MED ORDER — ACETAMINOPHEN 160 MG/5ML PO SOLN: 1000.0000 mg | Freq: Three times a day (TID) | ORAL | 0 refills | Status: AC

## 2018-12-24 MED ORDER — ADULT MULTIVITAMIN LIQUID CH
15.0000 mL | Freq: Every day | ORAL | Status: DC
  Administered 2018-12-24: 15 mL via ORAL
  Filled 2018-12-24 (×2): qty 15

## 2018-12-24 MED ORDER — OXYCODONE HCL 5 MG PO TABS: 5.0000 mg | ORAL_TABLET | Freq: Four times a day (QID) | ORAL | 0 refills | Status: AC | PRN

## 2018-12-24 NOTE — Progress Notes (Signed)
PROGRESS NOTE    Charles Humphrey.  YKD:[id number] DOB: [birth year] DOA: 12/21/2018 PCP: Leonard Downing, MD  Brief Narrative:  Charles Humphrey. is Charles Humphrey 63 y.o. male with medical history significant for severe intellectual disability and epilepsy who presents to the ED from [place] facility for evaluation of suspected right lower extremity fracture.  Patient is nonverbal and unable to provide any history, therefore entirety of history is obtained from patient's father at bedside and chart review.  Patient is chronically bedbound and is Charles Humphrey resident at [facility] facility.  Per ED documentation, when staff were rolling him in bed they noticed Charles Humphrey deformity to his right leg and due to concern for fracture he was sent to the ED.  There is no report of fall.  Per father, he is occasionally set up in Doralene Glanz chair and transfers with use of Sargent Mankey Hoyer lift.  Assessment & Plan:   Principal Problem:   Closed fracture of distal end of femur ([place]) Active Problems:   Epilepsy ([place])   [name]. is [name] 63 y.o. male with medical history significant for severe intellectual disability, chronically bedbound, and epilepsy who is admitted for closed fracture of the right femur.  13.4 cm Aggressive Osseous Lesion with Soft Tissue Component  Pathologic Fracture  Displaced closed fracture of distal end of femur: - Myeloma panel, free light chains, urine studies pending - CT chest/abdomen/pelvis without evident malignancy - Discussed with orthopedics this AM, recommending transfer to [facility] or [facility] for orthopedic oncology - pending goals of care, fixation for palliation maybe option as well. - Discussed briefly with father over phone (1/30), pt is on hospice.  Palliative care has been c/s to help clarify goals of care in this setting.  Palliative had meeting with father/sister yesterday.  Planning to discuss further.   -Knee mobilization and Buck's traction ordered -orthopedics  consult, appreciate recs -Scheduled apap and oxy prn for pain.  Bowel regimen.  Epilepsy: Continue home Keppra, phenobarbital, phenytoin.  Seizure precautions. Will transition these meds to IV as some difficulty with him taking them here.  NAGMA: follow, mild  Of note, pt followed by Hospice in outpatient setting  DVT prophylaxis: SCD, lovenox Code Status: DNR Family Communication: none at bedside  Disposition Plan: pending decisions regarding goals of care  Consultants:   Orthopedics  Palliative care  Procedures:   none  Antimicrobials:  Anti-infectives (From admission, onward)   None        Subjective: Nonverbal Appears comfortable Eating with tech.  Objective: Vitals:   12/23/18 0543 12/23/18 1417 12/23/18 2021 12/24/18 0641  BP: (!) 146/89 122/73 108/66 132/78  Pulse: 98 99 (!) 101 96  Resp: 19 20 (!) 22 18  Temp:  98.6 F (37 C) 98.4 F (36.9 C) 98.4 F (36.9 C)  TempSrc:  Oral Oral Oral  SpO2: 100% 97%  96%  Weight:      Height:        Intake/Output Summary (Last 24 hours) at 12/24/2018 0854 Last data filed at 12/24/2018 0500 Gross per 24 hour  Intake 1523.5 ml  Output 1225 ml  Net 298.5 ml   Filed Weights   12/22/18 0317  Weight: 77.9 kg    Examination:  General: No acute distress. Cardiovascular: Heart sounds show Sebastian Lurz regular rate, and rhythm.  Lungs: Clear to auscultation bilaterally  Abdomen: Soft, nontender, nondistended  Neurological: Alert.  Nonverbal. Skin: Warm and dry. No rashes or lesions. Extremities: RLE in traction  Data Reviewed: I have personally reviewed following labs and imaging studies  CBC: Recent Labs  Lab 12/22/18 0021 12/22/18 0746 12/23/18 0606 12/24/18 0735  WBC 10.1 9.7 9.6 9.3  NEUTROABS 7.8*  --   --   --   HGB 12.6* 12.8* 12.9* 13.2  HCT 38.8* 41.2 40.5 40.1  MCV 101.0* 100.2* 101.0* 99.0  PLT 319 311 270 537   Basic Metabolic Panel: Recent Labs  Lab 12/22/18 0021 12/22/18 0746  12/23/18 0606 12/24/18 0605  NA 139 140 138 138  K 3.5 3.7 4.0 4.5  CL 105 107 109 109  CO2 24 24 21* 20*  GLUCOSE 160* 121* 120* 108*  BUN _0 CREATININE 0.52* 0.49* 0.45* 0.46*  CALCIUM 8.3* 8.3* 8.0* 8.1*  MG  --   --  1.8 1.8   GFR: Estimated Creatinine Clearance: 92.5 mL/min (Kaj Vasil) (by C-G formula based on SCr of 0.46 mg/dL (L)). Liver Function Tests: No results for input(s): AST, ALT, ALKPHOS, BILITOT, PROT, ALBUMIN in the last 168 hours. No results for input(s): LIPASE, AMYLASE in the last 168 hours. No results for input(s): AMMONIA in the last 168 hours. Coagulation Profile: No results for input(s): INR, PROTIME in the last 168 hours. Cardiac Enzymes: No results for input(s): CKTOTAL, CKMB, CKMBINDEX, TROPONINI in the last 168 hours. BNP (last 3 results) No results for input(s): PROBNP in the last 8760 hours. HbA1C: No results for input(s): HGBA1C in the last 72 hours. CBG: No results for input(s): GLUCAP in the last 168 hours. Lipid Profile: No results for input(s): CHOL, HDL, LDLCALC, TRIG, CHOLHDL, LDLDIRECT in the last 72 hours. Thyroid Function Tests: No results for input(s): TSH, T4TOTAL, FREET4, T3FREE, THYROIDAB in the last 72 hours. Anemia Panel: No results for input(s): VITAMINB12, FOLATE, FERRITIN, TIBC, IRON, RETICCTPCT in the last 72 hours. Sepsis Labs: No results for input(s): PROCALCITON, LATICACIDVEN in the last 168 hours.  Recent Results (from the past 240 hour(s))  Surgical pcr screen     Status: Abnormal   Collection Time: 12/22/18  3:32 AM  Result Value Ref Range Status   MRSA, PCR NEGATIVE NEGATIVE Final   Staphylococcus aureus POSITIVE (Jerred Zaremba) NEGATIVE Final    Comment: (NOTE) The Xpert SA Assay (FDA approved for NASAL specimens in patients 62 years of age and older), is one component of Ileanna Gemmill comprehensive surveillance program. It is not intended to diagnose infection nor to guide or monitor treatment. Performed at [facility],  [place] [address], [place], [place] [address]          Radiology Studies: Ct Chest W Contrast  Result Date: 12/23/2018 CLINICAL DATA:  Pathologic fracture. Constipation and ulcerative colitis. EXAM: CT CHEST, ABDOMEN AND PELVIS WITHOUT CONTRAST TECHNIQUE: Multidetector CT imaging of the chest, abdomen and pelvis was performed following the standard protocol without IV contrast. COMPARISON:  Abdominal CT 07/09/2018 FINDINGS: CT CHEST FINDINGS Cardiovascular: Cardiomegaly. No pericardial effusion. No significant vascular finding Mediastinum/Nodes: Negative for adenopathy or inflammation. Lungs/Pleura: Low volume chest. Mild atelectatic opacity. Chronic cluster of nodules in the lateral right costophrenic sulcus, likely scarring given stability. No nodularity. Musculoskeletal: Profound osteopenia. No focal lesion or acute fracture. Remote and healed/healing lateral left rib fractures. CT ABDOMEN PELVIS FINDINGS Hepatobiliary: No focal liver abnormality.No evidence of biliary obstruction or stone. Pancreas: Unremarkable. Spleen: Unremarkable. Adrenals/Urinary Tract: Negative adrenals. Chronic bilateral hydronephrosis with urothelial thickening and periureteric edema at both UPJ. Multiple bilateral renal calculi which do not appear to cause obstruction on either side,  although 2 stones on the right have migrated into the renal pelvis since prior. The thick walled bladder is decompressed by Foley catheter. Stomach/Bowel: The redundant colon is diffusely distended by gas or stool. No small bowel dilatation. No inflammation or gross mass. Vascular/Lymphatic: Minimal atherosclerotic calcification. No mass or adenopathy. Reproductive:Negative. Other: No ascites or pneumoperitoneum. Musculoskeletal: No acute abnormalities. Marked osteopenia. 1 cm lucency in the left iliac medullary space that is stable and likely benign. IMPRESSION: 1. No evident malignancy to explain right femur lesion. No acute finding. 2. Severe  generalized osteopenia. 3. Redundant colon distended by gas and stool without obstruction. 4. Chronic bilateral hydronephrosis above the chronically inflamed or scarred UPJ. Multiple bilateral renal calculi. Electronically Signed   By: Monte Fantasia M.D.   On: 12/23/2018 06:00   Ct Abdomen Pelvis W Contrast  Result Date: 12/23/2018 CLINICAL DATA:  Pathologic fracture. Constipation and ulcerative colitis. EXAM: CT CHEST, ABDOMEN AND PELVIS WITHOUT CONTRAST TECHNIQUE: Multidetector CT imaging of the chest, abdomen and pelvis was performed following the standard protocol without IV contrast. COMPARISON:  Abdominal CT 07/09/2018 FINDINGS: CT CHEST FINDINGS Cardiovascular: Cardiomegaly. No pericardial effusion. No significant vascular finding Mediastinum/Nodes: Negative for adenopathy or inflammation. Lungs/Pleura: Low volume chest. Mild atelectatic opacity. Chronic cluster of nodules in the lateral right costophrenic sulcus, likely scarring given stability. No nodularity. Musculoskeletal: Profound osteopenia. No focal lesion or acute fracture. Remote and healed/healing lateral left rib fractures. CT ABDOMEN PELVIS FINDINGS Hepatobiliary: No focal liver abnormality.No evidence of biliary obstruction or stone. Pancreas: Unremarkable. Spleen: Unremarkable. Adrenals/Urinary Tract: Negative adrenals. Chronic bilateral hydronephrosis with urothelial thickening and periureteric edema at both UPJ. Multiple bilateral renal calculi which do not appear to cause obstruction on either side, although 2 stones on the right have migrated into the renal pelvis since prior. The thick walled bladder is decompressed by Foley catheter. Stomach/Bowel: The redundant colon is diffusely distended by gas or stool. No small bowel dilatation. No inflammation or gross mass. Vascular/Lymphatic: Minimal atherosclerotic calcification. No mass or adenopathy. Reproductive:Negative. Other: No ascites or pneumoperitoneum. Musculoskeletal: No acute  abnormalities. Marked osteopenia. 1 cm lucency in the left iliac medullary space that is stable and likely benign. IMPRESSION: 1. No evident malignancy to explain right femur lesion. No acute finding. 2. Severe generalized osteopenia. 3. Redundant colon distended by gas and stool without obstruction. 4. Chronic bilateral hydronephrosis above the chronically inflamed or scarred UPJ. Multiple bilateral renal calculi. Electronically Signed   By: Monte Fantasia M.D.   On: 12/23/2018 06:00   Ct Femur Right Wo Contrast  Result Date: 12/22/2018 CLINICAL DATA:  Distal femur fracture. No reported trauma. Patient is Suprena Travaglini. EXAM: CT OF THE LOWER RIGHT EXTREMITY WITHOUT CONTRAST TECHNIQUE: Multidetector CT imaging of the right lower extremity was performed according to the standard protocol. COMPARISON:  Right femur x-rays from yesterday. FINDINGS: Bones/Joint/Cartilage Again seen is Angelli Baruch comminuted fracture of the mid to distal right femoral metadiaphysis, extending into the proximal medial femoral condyle. No mention into the knee joint. There is Briany Aye approximately 1.7 cm of impaction with apex lateral and posterior angulation. The entire mid to distal femoral metadiaphysis intramedullary space is filled with soft tissue density, which extends beyond the cortical margins. The mass, including its soft tissue component, measures approximately 5.5 x 5.4 x 13.4 cm, and extends to the medial femoral condyle. There is no involvement of the superficial femoral vessels. There is no knee joint effusion. Severe osteopenia. Ligaments Suboptimally assessed by CT. Muscles and Tendons Moderate diffuse muscle atrophy. Soft  tissues Unremarkable. IMPRESSION: 1. Pathologic fracture of the right distal femur through Mackenzie Groom large 13.4 cm aggressive osseous lesion with soft tissue component. The differential diagnosis includes Suleman Gunning primary bone tumor such as high-grade chondrosarcoma, multiple myeloma, or metastatic disease. These results were called by  telephone at the time of interpretation on 12/22/2018 at 2:43 pm to Dr. Ophelia Charter, who verbally acknowledged these results. Electronically Signed   By: Titus Dubin M.D.   On: 12/22/2018 15:02        Scheduled Meds: . acetaminophen  1,000 mg Oral TID  . docusate sodium  250 mg Oral BID  . enoxaparin (LOVENOX) injection  40 mg Subcutaneous Q24H  . multivitamin with minerals  1 tablet Oral Daily  . mupirocin ointment  1 application Topical BID  . polyethylene glycol  17 g Oral Daily  . povidone-iodine  2 application Topical Once  . senna  2 tablet Oral QHS  . tamsulosin  0.8 mg Oral QHS   Continuous Infusions: . levETIRAcetam Stopped (12/23/18 2203)  . PHENObarbital 1000 mg IVPB Stopped (12/23/18 1045)  . phenytoin (DILANTIN) IV 175 mg (12/23/18 1213)     LOS: 2 days    Time spent: over 30 min    Fayrene Helper, MD [facility] Pager AMION  If 7PM-7AM, please contact night-coverage [email or web address] Password [facility] 12/24/2018, 8:54 AM

## 2018-12-24 NOTE — Progress Notes (Signed)
Patient ID: Charles Quest., male   DOB: 01/28/1956, 63 y.o.   MRN: [id number]  Had a long talk with father and sister-in-law of pt outlining surgical vs non-surgical options. After much discussion they would like to pursue non-surgical management. As he cannot return to SNF in traction will transition to knee immobilizer. This won't stabilize the fracture much but will remind staff to treat leg as a whole unit when moving, bathing, etc. He may discharge to SNF immediately from orthopedic standpoint.    Freeman Caldron, PA-C Orthopedic Surgery [phone]

## 2018-12-24 NOTE — Progress Notes (Addendum)
[facility] [id number] GIP RN Visit @ 1130  This is a covered and related GIP visit with a hospice diagnosis of protein calorie malnutrition. Pt is from [facility], staff was repositioning him in the bed and noticed his leg appeared to be fractured. No reported fall or other signs of trauma. Staff activated EMS and [facility] was [facility]. He is admitted with closed femur fracture.  Visited at bedside with father and Dr. Lowell GuitarPowell present. Goals of care discussed. With advice of ortho consult, plan is to return to SNF with [facility] services.   Patient appears comfortable. IV medications are going to transition to PO dosing. Ortho is going to apply a immobilzer to leg to stabilize fracture.    VS: 132/78, 96, 18, SpO2 96% RA, afebrile.  Abnormal labs: CO2 20, Glucose 108, Creatinine 0.46, Calcium 8.1, RBC 4.05  Medications: Keppra 500mg  IVPB BID, Phenobarbital 160mg  IVPB QD, Dilantin 175mg  IVPB BID  No new pertinent MD notes.  Discharge planning: Will return to [facility] with [facility] services after discharge.  PCG: Spoke with father at bedside as noted above.   IDT: Updated  Transfer summary and medication list placed on shadow chart  Once ready for discharge, please use [facility] for ambulance transport as they contract this service for [facility].  Thank you, Elsie SaasMary Anne Robertson, RN, [facility] Hospital Liaison (listed in [place]) [phone]

## 2018-12-24 NOTE — Discharge Summary (Signed)
**Note De-Identified vi Obfusction** Physicin Dischrge Summry  Charles Humphrey. YPP:[id number] DOB: [birth year] DOA: 12/21/2018  PCP: Leonrd Downing, MD  Admit dte: 12/21/2018 Dischrge dte: 12/24/2018  Time spent: 40 minutes  Recommendtions for Outptient Follow-up:  1. Follow with hospice for comfort mesures 2. Scheduled APAP for next 5 dys, then consider prn dosing 3. Per surgery, mintin knee mobilizer to remind stff to tret leg s whole unit when moving, bthing, etc.  Leg will need to be treted s  whole unit when moving, bthing, etc.  4. Foley ctheter plced for comfort in house, discontinued t dischrge.  If issues in the future regrding comfort nd his femur frcture, could consider foley for comfort.    5. Follow pending myelom lbs (light chins, myelom pnel, urine studies - pending t dischrge)   Dischrge Dignoses:  Principl Problem:   Closed frcture of distl end of femur ([place]) Active Problems:   Epilepsy ([place])   Bone lesion   Gols of cre, counseling/discussion   Pllitive cre encounter   Dischrge Condition: stble  Diet recommendtion: regulr  Filed Weights   12/22/18 0317  Weight: 77.9 kg    History of present illness:  Charles Humphreyis  63 y.o.mlewith medicl history significnt forsevere intellectul disbility nd epilepsy who [facility] of suspected right lower extremity frcture. Ptient is nonverbl nd unble to provide ny history, therefore entirety of history is obtined from ptient's fther t bedside nd chrt review. Ptient is chroniclly bedbound nd is  resident t [facility].Per ED documenttion, when stff were rolling him in bed they noticed  deformity to his right leg nd due to concern for frcture he ws sent to the ED. There is no report offll.Per fther, he is occsionlly set up in  chir nd trnsfers with use of  Hoyer lift.  He ws  dmitted with  R distl femur frcture.  He hd imging concerning for primry bone tumor vs myelom vs metsttic disese.  Bsed on discussions with pllitive cre, hospice, nd orthopedics, decision ws mde to pursue comfort mesures nd dischrge bck to [facility].    Hospitl Course:  RICARD FAULKNER Humphreyis  63 y.o.mlewith medicl history significnt forsevere intellectul disbility,chroniclly bedbound,nd epilepsy who is dmitted for closed frcture of the right femur.  13.4 cm Aggressive Osseous Lesion with Soft Tissue Component  Pthologic Frcture  Displced closed frcture of distl end of femur: - Myelom pnel, free light chins, urine studies pending t dischrge - fther/sister hd discussion with pllitive cre (sister is POA, but fther drives decision mking) - bsed on discussion plnning for comfort cre with return to SNF. - Per ortho, trnsition to knee immoblizer which will remind stff to tret leg s whole unit when moving, bthing, etc - CT chest/bdomen/pelvis without evident mlignncy -orthopedics consult, pprecite recs -Scheduled pp nd oxy prn for pin.  Bowel regimen.  Epilepsy: Continue home Keppr, phenobrbitl, phenytoin. Seizure precutions.  NAGMA: follow, mild  Foley in plce for comfort.  Will discontinue this t dischrge, though if issues with comfort in the future relted to frcture nd his voiding, could consider replcing this.  Chronic bilterl hydronephrosis: chronic on imging, consider follow up with urology outptient per gols of cre   Of note, pt followed by Hospice in outptient setting  Procedures:  none  Consulttions:  Ortho  Hospice  pllitive  Dischrge Exm: Vitls:   12/24/18 0641 12/24/18 1312  BP: 132/78 135/67  Pulse: 96 (!) 120  Resp: **Note De-Identified vi Obfusction** 18 (!) 22  Temp: 98.4 F (36.9 C) 98.1 F (36.7 C)  SpO2: 96% 95%   Nonverbl Appers comfortble Eting with tech.  Generl: No cute  distress. Crdiovsculr: Hert sounds show  regulr rte, nd rhythm.  Lungs: Cler to usculttion bilterlly  Abdomen: Soft, nontender, nondistended  Neurologicl: Alert.  Nonverbl. Skin: Wrm nd dry. No rshes or lesions. Extremities: RLE in trction  Dischrge Instructions   Dischrge Instructions    Cll MD for:  difficulty brething, hedche or visul disturbnces   Complete by:  As directed    Cll MD for:  extreme ftigue   Complete by:  As directed    Cll MD for:  hives   Complete by:  As directed    Cll MD for:  persistnt dizziness or light-hededness   Complete by:  As directed    Cll MD for:  persistnt nuse nd vomiting   Complete by:  As directed    Cll MD for:  redness, tenderness, or signs of infection (pin, swelling, redness, odor or green/yellow dischrge round incision site)   Complete by:  As directed    Cll MD for:  severe uncontrolled pin   Complete by:  As directed    Cll MD for:  temperture >100.4   Complete by:  As directed    Diet - low sodium hert helthy   Complete by:  As directed    Dischrge instructions   Complete by:  As directed    You were seen for  right femur frcture.  There is  mss there tht potentilly is consistent with cncer.  After discussion with pllitive cre nd hospice nd orthopedics, the decision ws mde for comfort cre.   Continue scheduled tylenol for the next few dys, then tke s needed.  We'll prescribe oxycodone s needed for pin.  Plese sk your PCP to request records from this hospitliztion so they know wht ws done nd wht the next steps will be.   Increse ctivity slowly   Complete by:  As directed      Allergies s of 12/24/2018      Rections   Other Other (See Comments)   No diry products      Mediction List    TAKE these medictions   cetminophen 160 MG/5ML solution Commonly known s:  TYLENOL Tke 31.3 mLs (1,000 mg totl) by mouth 3 (three) times dily for 5  dys.   lbuterol (2.5 MG/3ML) 0.083% nebulizer solution Commonly known s:  PROVENTIL Tke 2.5 mg by nebuliztion every 6 (six) hours s needed. For shortness of breth   docuste sodium 250 MG cpsule Commonly known s:  COLACE Tke 250 mg by mouth 2 (two) times dily.   lctulose 10 GM/15ML solution Commonly known s:  CHRONULAC Tke 30 g by mouth 4 (four) times dily.   levETIRAcetm 100 MG/ML solution Commonly known s:  KEPPRA Tke 500 mg by mouth 2 (two) times dily.   oxyCODONE 5 MG immedite relese tblet Commonly known s:  Oxy IR/ROXICODONE Tke 1 tblet (5 mg totl) by mouth every 6 (six) hours s needed for up to 3 dys for severe pin.   PHENObrbitl 20 MG/5ML elixir Tke 160 mg by mouth every evening.   phenytoin 125 MG/5ML suspension Commonly known s:  DILANTIN Tke 175 mg by mouth every 12 (twelve) hours.   potssium chloride 10 MEQ CR cpsule Commonly known s:  MICRO-K Tke 20 mEq by mouth dily.   senn 8.6 MG Tbs  tablet Commonly known as:  SENOKOT Take 2 tablets by mouth at bedtime.   simethicone 40 MG/0.6ML drops Commonly known as:  MYLICON Take 094 mg by mouth 2 (two) times daily.   tamsulosin 0.4 MG Caps capsule Commonly known as:  FLOMAX Take 0.8 mg by mouth at bedtime.   UNABLE TO FIND Take 90 mLs by mouth 2 (two) times daily. Med Name: Medpass      Allergies  Allergen Reactions  . Other Other (See Comments)    No dairy products      The results of significant diagnostics from this hospitalization (including imaging, microbiology, ancillary and laboratory) are listed below for reference.    Significant Diagnostic Studies: Ct Chest W Contrast  Result Date: 12/23/2018 CLINICAL DATA:  Pathologic fracture. Constipation and ulcerative colitis. EXAM: CT CHEST, ABDOMEN AND PELVIS WITHOUT CONTRAST TECHNIQUE: Multidetector CT imaging of the chest, abdomen and pelvis was performed following the standard protocol without IV contrast.  COMPARISON:  Abdominal CT 07/09/2018 FINDINGS: CT CHEST FINDINGS Cardiovascular: Cardiomegaly. No pericardial effusion. No significant vascular finding Mediastinum/Nodes: Negative for adenopathy or inflammation. Lungs/Pleura: Low volume chest. Mild atelectatic opacity. Chronic cluster of nodules in the lateral right costophrenic sulcus, likely scarring given stability. No nodularity. Musculoskeletal: Profound osteopenia. No focal lesion or acute fracture. Remote and healed/healing lateral left rib fractures. CT ABDOMEN PELVIS FINDINGS Hepatobiliary: No focal liver abnormality.No evidence of biliary obstruction or stone. Pancreas: Unremarkable. Spleen: Unremarkable. Adrenals/Urinary Tract: Negative adrenals. Chronic bilateral hydronephrosis with urothelial thickening and periureteric edema at both UPJ. Multiple bilateral renal calculi which do not appear to cause obstruction on either side, although 2 stones on the right have migrated into the renal pelvis since prior. The thick walled bladder is decompressed by Foley catheter. Stomach/Bowel: The redundant colon is diffusely distended by gas or stool. No small bowel dilatation. No inflammation or gross mass. Vascular/Lymphatic: Minimal atherosclerotic calcification. No mass or adenopathy. Reproductive:Negative. Other: No ascites or pneumoperitoneum. Musculoskeletal: No acute abnormalities. Marked osteopenia. 1 cm lucency in the left iliac medullary space that is stable and likely benign. IMPRESSION: 1. No evident malignancy to explain right femur lesion. No acute finding. 2. Severe generalized osteopenia. 3. Redundant colon distended by gas and stool without obstruction. 4. Chronic bilateral hydronephrosis above the chronically inflamed or scarred UPJ. Multiple bilateral renal calculi. Electronically Signed   By: Monte Fantasia M.D.   On: 12/23/2018 06:00   Ct Abdomen Pelvis W Contrast  Result Date: 12/23/2018 CLINICAL DATA:  Pathologic fracture. Constipation  and ulcerative colitis. EXAM: CT CHEST, ABDOMEN AND PELVIS WITHOUT CONTRAST TECHNIQUE: Multidetector CT imaging of the chest, abdomen and pelvis was performed following the standard protocol without IV contrast. COMPARISON:  Abdominal CT 07/09/2018 FINDINGS: CT CHEST FINDINGS Cardiovascular: Cardiomegaly. No pericardial effusion. No significant vascular finding Mediastinum/Nodes: Negative for adenopathy or inflammation. Lungs/Pleura: Low volume chest. Mild atelectatic opacity. Chronic cluster of nodules in the lateral right costophrenic sulcus, likely scarring given stability. No nodularity. Musculoskeletal: Profound osteopenia. No focal lesion or acute fracture. Remote and healed/healing lateral left rib fractures. CT ABDOMEN PELVIS FINDINGS Hepatobiliary: No focal liver abnormality.No evidence of biliary obstruction or stone. Pancreas: Unremarkable. Spleen: Unremarkable. Adrenals/Urinary Tract: Negative adrenals. Chronic bilateral hydronephrosis with urothelial thickening and periureteric edema at both UPJ. Multiple bilateral renal calculi which do not appear to cause obstruction on either side, although 2 stones on the right have migrated into the renal pelvis since prior. The thick walled bladder is decompressed by Foley catheter. Stomach/Bowel: The redundant colon is diffusely **Note De-Identified vi Obfusction** distended by gs or stool. No smll bowel dilttion. No inflmmtion or gross mss. Vsculr/Lymphtic: Miniml therosclerotic clcifiction. No mss or denopthy. Reproductive:Negtive. Other: No scites or pneumoperitoneum. Musculoskeletl: No cute bnormlities. Mrked osteopeni. 1 cm lucency in the left ilic medullry spce tht is stble nd likely benign. IMPRESSION: 1. No evident mlignncy to explin right femur lesion. No cute finding. 2. Severe generlized osteopeni. 3. Redundnt colon distended by gs nd stool without obstruction. 4. Chronic bilterl hydronephrosis bove the chroniclly inflmed or scrred UPJ.  Multiple bilterl renl clculi. Electroniclly Signed   By: Monte Fntsi M.D.   On: 12/23/2018 06:00   Ct Femur Right Wo Contrst  Result Dte: 12/22/2018 CLINICL DT:  Distl femur frcture. No reported trum. Ptient is . EXM: CT OF THE LOWER RIGHT EXTREMITY WITHOUT CONTRST TECHNIQUE: Multidetector CT imging of the right lower extremity ws performed ccording to the stndrd protocol. COMPRISON:  Right femur x-rys from yesterdy. FINDINGS: Bones/Joint/Crtilge gin seen is  comminuted frcture of the mid to distl right femorl metdiphysis, extending into the proximl medil femorl condyle. No mention into the knee joint. There is  pproximtely 1.7 cm of impction with pex lterl nd posterior ngultion. The entire mid to distl femorl metdiphysis intrmedullry spce is filled with soft tissue density, which extends beyond the corticl mrgins. The mss, including its soft tissue component, mesures pproximtely 5.5 x 5.4 x 13.4 cm, nd extends to the medil femorl condyle. There is no involvement of the superficil femorl vessels. There is no knee joint effusion. Severe osteopeni. Ligments Suboptimlly ssessed by CT. Muscles nd Tendons Moderte diffuse muscle trophy. Soft tissues Unremrkble. IMPRESSION: 1. Pthologic frcture of the right distl femur through  lrge 13.4 cm ggressive osseous lesion with soft tissue component. The differentil dignosis includes  primry bone tumor such s high-grde chondrosrcom, multiple myelom, or metsttic disese. These results were clled by telephone t the time of interprettion on 12/22/2018 t 2:43 pm to Dr. Opheli Chrter, who verblly cknowledged these results. Electroniclly Signed   By: Titus Dubin M.D.   On: 12/22/2018 15:02   Dg Chest Port 1 View  Result Dte: 12/21/2018 CLINICL DT:  Fll, femur frcture. EXM: PORTBLE CHEST 1 VIEW COMPRISON:  06/24/2012 FINDINGS: Crdiomegly. Low lung volumes. No  confluent irspce opcities, effusions or edem. No cute bony bnormlity. IMPRESSION: Low lung volumes.  Crdiomegly.  No ctive disese. Electroniclly Signed   By: Rolm Bptise M.D.   On: 12/21/2018 23:24   Dg Femur Portble Min 2 Views Right  Result Dte: 12/21/2018 CLINICL DT:  Right leg pin. EXM: RIGHT FEMUR PORTBLE 2 VIEW COMPRISON:  None. FINDINGS: There is  comminuted frcture through the mid nd distl right femur. Significnt ngultion nd displcement of frcture frgments. Frcture ginst in the distl shft nd extends into the medil femorl condyle. IMPRESSION: Displced, ngulted comminuted distl right femorl frcture. Electroniclly Signed   By: Rolm Bptise M.D.   On: 12/21/2018 23:24    Microbiology: Recent Results (from the pst 240 hour(s))  Surgicl pcr screen     Sttus: bnorml   Collection Time: 12/22/18  3:32 M  Result Vlue Ref Rnge Sttus   MRS, PCR NEGTIVE NEGTIVE Finl   Stphylococcus ureus POSITIVE () NEGTIVE Finl    Comment: (NOTE) The Xpert S ssy (FD pproved for NSL specimens in ptients 3 yers of ge nd older), is one component of  comprehensive surveillnce progrm. It is not intended to dignose infection nor to guide or monitor tretment. Performed t  [facility], [place] [address], [place], [place] [address]      Labs: Basic Metabolic Panel: Recent Labs  Lab 12/22/18 0021 12/22/18 0746 12/23/18 0606 12/24/18 0605  NA 139 140 138 138  K 3.5 3.7 4.0 4.5  CL 105 107 109 109  CO2 24 24 21* 20*  GLUCOSE 160* 121* 120* 108*  BUN '8 6 6 9  ' CREATININE 0.52* 0.49* 0.45* 0.46*  CALCIUM 8.3* 8.3* 8.0* 8.1*  MG  --   --  1.8 1.8   Liver Function Tests: No results for input(s): AST, ALT, ALKPHOS, BILITOT, PROT, ALBUMIN in the last 168 hours. No results for input(s): LIPASE, AMYLASE in the last 168 hours. No results for input(s): AMMONIA in the last 168 hours. CBC: Recent Labs  Lab 12/22/18 0021  12/22/18 0746 12/23/18 0606 12/24/18 0735  WBC 10.1 9.7 9.6 9.3  NEUTROABS 7.8*  --   --   --   HGB 12.6* 12.8* 12.9* 13.2  HCT 38.8* 41.2 40.5 40.1  MCV 101.0* 100.2* 101.0* 99.0  PLT 319 311 270 390   Cardiac Enzymes: No results for input(s): CKTOTAL, CKMB, CKMBINDEX, TROPONINI in the last 168 hours. BNP: BNP (last 3 results) No results for input(s): BNP in the last 8760 hours.  ProBNP (last 3 results) No results for input(s): PROBNP in the last 8760 hours.  CBG: No results for input(s): GLUCAP in the last 168 hours.     Signed:  Fayrene Helper MD.  [facility] 12/24/2018, 3:44 PM

## 2018-12-24 NOTE — Progress Notes (Signed)
Discharge to: [facility] Anticipated discharge date: 12/24/18 Family notified: Yes, at bedside Transportation by: [facility]  Report #: [phone], Room 403B  CSW signing off.  Blenda Nicely LCSW [phone]

## 2018-12-24 NOTE — Progress Notes (Signed)
Orthopedic Tech Progress Note Patient Details:  Charles Humphrey 1956-09-08 [id number] RN said patient has a Knee Immobilizer already Patient ID: [name], male   DOB: 05-16-1956, 63 y.o.   MRN: [id number]   [name] 12/24/2018, 1:03 PM

## 2018-12-27 LAB — UPEP/UIFE/LIGHT CHAINS/TP, 24-HR UR
% BETA, Urine: 32.6 %
ALPHA 1 URINE: 3.6 %
Albumin, U: 29 %
Alpha 2, Urine: 13.3 %
Free Kappa Lt Chains,Ur: 65.85 mg/L (ref 0.63–113.79)
Free Kappa/Lambda Ratio: 5.05 (ref 1.03–31.76)
Free Lambda Lt Chains,Ur: 13.04 mg/L — ABNORMAL HIGH (ref 0.47–11.77)
GAMMA GLOBULIN URINE: 21.4 %
TOTAL VOLUME: 3000
Total Protein, Urine-Ur/day: 813 mg/24 hr — ABNORMAL HIGH (ref 30–150)
Total Protein, Urine: 27.1 mg/dL

## 2018-12-27 LAB — MULTIPLE MYELOMA PANEL, SERUM
Albumin SerPl Elph-Mcnc: 2.6 g/dL — ABNORMAL LOW (ref 2.9–4.4)
Albumin/Glob SerPl: 0.8 (ref 0.7–1.7)
Alpha 1: 0.3 g/dL (ref 0.0–0.4)
Alpha2 Glob SerPl Elph-Mcnc: 0.9 g/dL (ref 0.4–1.0)
B-Globulin SerPl Elph-Mcnc: 1.4 g/dL — ABNORMAL HIGH (ref 0.7–1.3)
Gamma Glob SerPl Elph-Mcnc: 1 g/dL (ref 0.4–1.8)
Globulin, Total: 3.6 g/dL (ref 2.2–3.9)
IgA: 571 mg/dL — ABNORMAL HIGH (ref 90–386)
IgG (Immunoglobin G), Serum: 1235 mg/dL (ref 700–1600)
IgM (Immunoglobulin M), Srm: 38 mg/dL (ref 20–172)
Total Protein ELP: 6.2 g/dL (ref 6.0–8.5)

## 2021-05-24 DEATH — deceased
